# Patient Record
Sex: Female | Born: 1960
Health system: Southern US, Community
[De-identification: ages and names within clinical notes are randomized; demographics above are authoritative.]

## PROBLEM LIST (undated history)

## (undated) DIAGNOSIS — IMO0001 Reserved for inherently not codable concepts without codable children: Secondary | ICD-10-CM

## (undated) DIAGNOSIS — F319 Bipolar disorder, unspecified: Secondary | ICD-10-CM

## (undated) DIAGNOSIS — H9190 Unspecified hearing loss, unspecified ear: Secondary | ICD-10-CM

## (undated) DIAGNOSIS — F329 Major depressive disorder, single episode, unspecified: Secondary | ICD-10-CM

## (undated) DIAGNOSIS — G4730 Sleep apnea, unspecified: Secondary | ICD-10-CM

## (undated) DIAGNOSIS — F32A Depression, unspecified: Secondary | ICD-10-CM

## (undated) HISTORY — DX: Unspecified hearing loss, unspecified ear: H91.90

## (undated) HISTORY — PX: TUBAL LIGATION: SHX77

## (undated) HISTORY — PX: APPENDECTOMY: SHX54

## (undated) HISTORY — DX: Reserved for inherently not codable concepts without codable children: IMO0001

---

## 2001-04-11 ENCOUNTER — Other Ambulatory Visit: Admission: RE | Admit: 2001-04-11 | Discharge: 2001-04-11 | Payer: Self-pay | Admitting: *Deleted

## 2003-12-07 ENCOUNTER — Other Ambulatory Visit: Admission: RE | Admit: 2003-12-07 | Discharge: 2003-12-07 | Payer: Self-pay | Admitting: Family Medicine

## 2004-11-08 ENCOUNTER — Ambulatory Visit (HOSPITAL_COMMUNITY): Admission: RE | Admit: 2004-11-08 | Discharge: 2004-11-08 | Payer: Self-pay | Admitting: Family Medicine

## 2007-08-21 ENCOUNTER — Other Ambulatory Visit: Admission: RE | Admit: 2007-08-21 | Discharge: 2007-08-21 | Payer: Self-pay | Admitting: Family Medicine

## 2012-01-25 ENCOUNTER — Emergency Department (HOSPITAL_BASED_OUTPATIENT_CLINIC_OR_DEPARTMENT_OTHER)
Admission: EM | Admit: 2012-01-25 | Discharge: 2012-01-25 | Disposition: A | Discharge status: Home / Self Care | Payer: BC Managed Care – PPO | Attending: Emergency Medicine | Admitting: Emergency Medicine

## 2012-01-25 ENCOUNTER — Encounter (HOSPITAL_BASED_OUTPATIENT_CLINIC_OR_DEPARTMENT_OTHER): Payer: Self-pay

## 2012-01-25 DIAGNOSIS — N390 Urinary tract infection, site not specified: Secondary | ICD-10-CM | POA: Insufficient documentation

## 2012-01-25 DIAGNOSIS — R109 Unspecified abdominal pain: Secondary | ICD-10-CM | POA: Insufficient documentation

## 2012-01-25 LAB — URINALYSIS, ROUTINE W REFLEX MICROSCOPIC
Glucose, UA: NEGATIVE mg/dL
Ketones, ur: 15 mg/dL — AB
Nitrite: POSITIVE — AB
Protein, ur: NEGATIVE mg/dL
Specific Gravity, Urine: 1.025 (ref 1.005–1.030)
Urobilinogen, UA: 4 mg/dL — ABNORMAL HIGH (ref 0.0–1.0)
pH: 5 (ref 5.0–8.0)

## 2012-01-25 LAB — URINE MICROSCOPIC-ADD ON

## 2012-01-25 MED ORDER — LIDOCAINE HCL (PF) 1 % IJ SOLN
INTRAMUSCULAR | Status: AC
  Administered 2012-01-25: 5 mL
  Filled 2012-01-25: qty 5

## 2012-01-25 MED ORDER — CEFTRIAXONE SODIUM 1 G IJ SOLR
1.0000 g | Freq: Once | INTRAMUSCULAR | Status: AC
  Administered 2012-01-25: 1 g via INTRAMUSCULAR
  Filled 2012-01-25: qty 10

## 2012-01-25 MED ORDER — PENICILLIN V POTASSIUM 500 MG PO TABS: 500.0000 mg | ORAL_TABLET | Freq: Four times a day (QID) | ORAL | Status: AC

## 2012-01-25 NOTE — ED Provider Notes (Signed)
History     CSN: [id number]  Arrival date & time 01/25/12  1150   First MD Initiated Contact with Patient 01/25/12 1206      Chief Complaint  Patient presents with  . Urinary Tract Infection    (Consider location/radiation/quality/duration/timing/severity/associated sxs/prior treatment) HPI Comments: Pt states that she was seen by her doctor 3 days ago and given a script but it doesn't seem to be working:pt states that she has intermittent sharp pain in the suprapubic region:pt is currently taking macrobid, but had been on cipro for 3 days prior  Patient is a 51 y.o. female presenting with urinary tract infection. The history is provided by the patient. No language interpreter was used.  Urinary Tract Infection This is a new problem. The current episode started in the past 7 days. The problem occurs constantly. The problem has been unchanged. Associated symptoms include abdominal pain, chills and coughing. Pertinent negatives include no fever, nausea or vomiting. The symptoms are aggravated by nothing.    History reviewed. No pertinent past medical history.  Past Surgical History  Procedure Date  . Appendectomy     History reviewed. No pertinent family history.  History  Substance Use Topics  . Smoking status: Never Smoker   . Smokeless tobacco: Not on file  . Alcohol Use: Yes     occasionally    OB History    Grav Para Term Preterm Abortions TAB SAB Ect Mult Living                  Review of Systems  Constitutional: Positive for chills. Negative for fever.  HENT: Negative.   Respiratory: Positive for cough.   Cardiovascular: Negative.   Gastrointestinal: Positive for abdominal pain. Negative for nausea and vomiting.  Genitourinary: Negative for vaginal discharge.  Neurological: Negative.     Allergies  Review of patient's allergies indicates no known allergies.  Home Medications  No current outpatient prescriptions on file.  BP 149/91  Pulse 63   Temp(Src) 98.4 F (36.9 C) (Oral)  Resp 16  SpO2 100%  Physical Exam  Nursing note and vitals reviewed. Constitutional: She is oriented to person, place, and time. She appears well-developed and well-nourished.  Eyes: Conjunctivae and EOM are normal.  Neck: Neck supple.  Cardiovascular: Normal rate and regular rhythm.   Pulmonary/Chest: Effort normal and breath sounds normal.  Abdominal: Soft. Bowel sounds are normal. There is no CVA tenderness.  Musculoskeletal: Normal range of motion.  Neurological: She is alert and oriented to person, place, and time.  Skin: Skin is warm and dry.  Psychiatric: She has a normal mood and affect.    ED Course  Procedures (including critical care time)  Labs Reviewed  URINALYSIS, ROUTINE W REFLEX MICROSCOPIC - Abnormal; Notable for the following:    Color, Urine ORANGE (*) BIOCHEMICALS MAY BE AFFECTED BY COLOR   Hgb urine dipstick TRACE (*)    Bilirubin Urine SMALL (*)    Ketones, ur 15 (*)    Urobilinogen, UA 4.0 (*)    Nitrite POSITIVE (*)    Leukocytes, UA MODERATE (*)    All other components within normal limits  URINE MICROSCOPIC-ADD ON - Abnormal; Notable for the following:    Bacteria, UA FEW (*)    All other components within normal limits  URINE CULTURE   No results found.   1. UTI (lower urinary tract infection)       MDM  Husband came to room after initial assessment and offered that she  was on cipro but it wasn't working so they switched to macrobid and that a culture report confirmed sensitivity:husband states that she is having chills and flank pain although pt denies:husband states that unsure of fever because of hydrocodone and tylenol OZH:YQMVHQIO culture report from [facility]:pts NGE:XBMW switch to pcn as it is sensitive per culture report:pt exam is benign        Teressa Lower, NP 01/25/12 1344

## 2012-01-25 NOTE — ED Provider Notes (Signed)
Medical screening examination/treatment/procedure(s) were performed by non-physician practitioner and as supervising physician I was immediately available for consultation/collaboration.   Aramis Zobel, MD 01/25/12 1805 

## 2012-01-25 NOTE — ED Notes (Signed)
Patient states that her symptoms started 3/23. Patient symptoms burning, frequency, taken Azo with no relief. Patient also states that she has pain on left lower side which starts out as sharp and fades away.

## 2012-01-25 NOTE — Discharge Instructions (Signed)
Urinary Tract Infection in Pregnancy A urinary tract infection (UTI) is a bacterial infection of the urinary tract. Infection of the urinary tract can include the ureters, kidneys (pyelonephritis), bladder (cystitis), and urethra (urethritis). All pregnant women should be screened for bacteria in the urinary tract. Identifying and treating a UTI will decrease the risk of preterm labor and developing more serious infections in both the mother and baby. CAUSES Bacteria germs cause almost all UTIs. There are many factors that can increase your chances of getting a UTI during pregnancy. These include:  Having a short urethra.   Poor toilet and hygiene habits.   Sexual intercourse.   Blockage of urine along the urinary tract.   Problems with the pelvic muscles or nerves.   Diabetes.   Obesity.   Bladder problems after having several children.   Previous history of UTI.  SYMPTOMS   Pain, burning, or a stinging feeling when urinating.   Suddenly feeling the need to urinate right away (urgency).   Loss of bladder control (urinary incontinence).   Frequent urination, more than is common with pregnancy.   Lower abdominal or back discomfort.   Bad smelling urine.   Cloudy urine.   Blood in the urine (hematuria).   Fever.  When the kidneys are infected, the symptoms may be:  Back pain.   Flank pain on the right side more so than the left.   Fever.   Chills.   Nausea.   Vomiting.  DIAGNOSIS   Urine tests.   Additional tests and procedures may include:   Ultrasound of the kidneys, ureters, bladder, and urethra.   Looking in the bladder with a lighted tube (cystoscopy).   Certain X-ray studies only when absolutely necessary.  Finding out the results of your test Ask when your test results will be ready. Make sure you get your test results. TREATMENT  Antibiotic medicine by mouth.   Antibiotics given through the vein (intravenously), if needed.  HOME CARE  INSTRUCTIONS   Take your antibiotics as directed. Finish them even if you start to feel better. Only take medicine as directed by your caregiver.   Drink enough fluids to keep your urine clear or pale yellow.   Do not have sexual intercourse until the infection is gone and your caregiver says it is okay.   Make sure you are tested for UTIs throughout your pregnancy if you get one. These infections often come back.  Preventing a UTI in the future:  Practice good toilet habits. Always wipe from front to back. Use the tissue only once.   Do not hold your urine. Empty your bladder as soon as possible when the urge comes.   Do not douche or use deodorant sprays.   Wash with soap and warm water around the genital area and the anus.   Empty your bladder before and after sexual intercourse.   Wear underwear with a cotton crotch.   Avoid caffeine and carbonated drinks. They can irritate the bladder.   Drink cranberry juice or take cranberry pills. This may decrease the risk of getting a UTI.   Do not drink alcohol.   Keep all your appointments and tests as scheduled.  SEEK MEDICAL CARE IF:   Your symptoms get worse.   You are still having fevers 2 or more days after treatment begins.   You develop a rash.   You feel that you are having problems with medicines prescribed.   You develop abnormal vaginal discharge.  SEEK IMMEDIATE MEDICAL   CARE IF:   You develop back or flank pain.   You develop chills.   You have blood in your urine.   You develop nausea and vomiting.   You develop contractions of your uterus.   You have a gush of fluid from the vagina.  MAKE SURE YOU:   Understand these instructions.   Will watch your condition.   Will get help right away if you are not doing well or get worse.  Document Released: 02/10/2011 Document Revised: 10/05/2011 Document Reviewed: 02/10/2011 [employer] Patient Information 2012 [employer]. 

## 2012-01-26 LAB — URINE CULTURE
Colony Count: NO GROWTH
Culture  Setup Time: 201303290208
Culture: NO GROWTH

## 2012-02-06 ENCOUNTER — Other Ambulatory Visit: Payer: Self-pay | Admitting: Family Medicine

## 2014-08-25 ENCOUNTER — Encounter: Payer: Self-pay | Admitting: Pulmonary Disease

## 2014-08-25 ENCOUNTER — Ambulatory Visit (INDEPENDENT_AMBULATORY_CARE_PROVIDER_SITE_OTHER): Payer: 59 | Admitting: Pulmonary Disease

## 2014-08-25 VITALS — BP 120/80 | HR 79 | Temp 98.1°F | Ht 61.0 in | Wt 158.6 lb

## 2014-08-25 DIAGNOSIS — G4733 Obstructive sleep apnea (adult) (pediatric): Secondary | ICD-10-CM

## 2014-08-25 NOTE — Patient Instructions (Signed)
Will refer you to dental medicine for consideration of an oral appliance.  Let me know if this doesn't work out, and we can consider a trial of cpap. Work on weight reduction

## 2014-08-25 NOTE — Progress Notes (Signed)
Subjective:    Patient ID: Kimberly Haynes, female    DOB: 1961-09-05, 53 y.o.   MRN: [id number]  HPI The patient is a 53 year old female who I've been asked to see for management of obstructive sleep apnea. She has had a home sleep test which showed an AHI of 19 events per hour, consistent with moderate OSA.  The patient has been noted to have snoring and an abnormal breathing pattern during sleep by her husband, but she has not had gasping arousals.  She does not have frequent awakenings at night, and feels that she is fairly rested when she gets up and starts her day. She denies any significant sleep pressure during the day, and only has occasional sleepiness in the evenings watching television or movies. She has no sleepiness with driving. Her weight has increased about 8 pounds over the last 2 years, and her Epworth score is only 2.   Sleep Questionnaire What time do you typically go to bed?( Between what hours) 10-11pm 10-11pm at 1430 on 08/25/14 by Lilli Few, CMA How long does it take you to fall asleep? 15 minutes 15 minutes at 1430 on 08/25/14 by Lilli Few, CMA How many times during the night do you wake up? 1 1 at 1430 on 08/25/14 by Lilli Few, CMA What time do you get out of bed to start your day? 0700 0700 at 1430 on 08/25/14 by Lilli Few, CMA Do you drive or operate heavy machinery in your occupation? No No at 1430 on 08/25/14 by Lilli Few, CMA How much has your weight changed (up or down) over the past two years? (In pounds) 6 lb (2.722 kg) 6 lb (2.722 kg) at 1430 on 08/25/14 by Lilli Few, CMA Have you ever had a sleep study before? No No at 1430 on 08/25/14 by Lilli Few, CMA Do you currently use CPAP? No No at 1430 on 08/25/14 by Lilli Few, CMA Do you wear oxygen at any time? No No at 1430 on 08/25/14 by Lilli Few, CMA   Review of Systems  Constitutional: Negative for  fever and unexpected weight change.  HENT: Negative for congestion, dental problem, ear pain, nosebleeds, postnasal drip, rhinorrhea, sinus pressure, sneezing, sore throat and trouble swallowing.   Eyes: Negative for redness and itching.  Respiratory: Negative for cough, chest tightness, shortness of breath and wheezing.   Cardiovascular: Negative for palpitations and leg swelling.  Gastrointestinal: Negative for nausea and vomiting.  Genitourinary: Negative for dysuria.  Musculoskeletal: Negative for joint swelling.  Skin: Negative for rash.  Neurological: Negative for headaches.  Hematological: Does not bruise/bleed easily.  Psychiatric/Behavioral: Negative for dysphoric mood. The patient is not nervous/anxious.        Objective:   Physical Exam Constitutional:  Well developed, no acute distress  HENT:  Nares patent without discharge, but narrowed bilat.  Oropharynx without exudate, palate and uvula are mildly elongated.  Eyes:  Perrla, eomi, no scleral icterus  Neck:  No JVD, no TMG  Cardiovascular:  Normal rate, regular rhythm, no rubs or gallops.  No murmurs        Intact distal pulses  Pulmonary :  Normal breath sounds, no stridor or respiratory distress   No rales, rhonchi, or wheezing  Abdominal:  Soft, nondistended, bowel sounds present.  No tenderness noted.   Musculoskeletal:  No lower extremity edema noted.  Lymph Nodes:  No cervical lymphadenopathy noted  Skin:  No cyanosis noted  Neurologic:  Alert, appropriate, moves all 4 extremities without obvious deficit.         Assessment & Plan:

## 2014-08-25 NOTE — Assessment & Plan Note (Signed)
The patient has moderate obstructive sleep apnea by her recent home sleep test, but surprisingly has no issues with sleep or daytime alertness issues. She is concerned about its impact to her husband's sleep, and would like to treat this aggressively because of this. Given the severity of her sleep apnea, there is some increased risk for cardiovascular events, but it is not overly excessive. I have outlined a conservative approach for treatment with a trial of weight loss alone, but the patient would prefer a more aggressive approach. I do think she is a reasonable candidate for dental appliance as well as C Pap, but I do not think upper airway surgery is a good treatment option for her. The patient does not think she can wear C Pap, and would prefer trying a dental appliance first.

## 2014-09-08 ENCOUNTER — Institutional Professional Consult (permissible substitution): Payer: BC Managed Care – PPO | Admitting: Internal Medicine

## 2015-09-22 ENCOUNTER — Other Ambulatory Visit (HOSPITAL_COMMUNITY)
Admission: RE | Admit: 2015-09-22 | Discharge: 2015-09-22 | Disposition: A | Discharge status: Home / Self Care | Payer: 59 | Source: Ambulatory Visit | Attending: Family Medicine | Admitting: Family Medicine

## 2015-09-22 ENCOUNTER — Other Ambulatory Visit: Payer: Self-pay | Admitting: Family Medicine

## 2015-09-22 DIAGNOSIS — Z01419 Encounter for gynecological examination (general) (routine) without abnormal findings: Secondary | ICD-10-CM | POA: Insufficient documentation

## 2015-09-28 LAB — CYTOLOGY - PAP

## 2015-11-04 MED FILL — ZALEPLON 10 MG CAPSULE: 10 | 30 days supply | Qty: 60 | Fill #0

## 2015-11-05 MED FILL — BUPROPION HCL XL 300 MG TAB: 300 | 30 days supply | Qty: 30 | Fill #2

## 2015-11-05 MED FILL — VYVANSE 40 MG CAPSULE: 40 | 30 days supply | Qty: 30 | Fill #0

## 2015-11-05 MED FILL — ZIPRASIDONE HCL 20 MG CAP: 20 | 30 days supply | Qty: 30 | Fill #2

## 2015-11-23 MED FILL — ZIPRASIDONE HCL 80 MG CAP: 80 | 30 days supply | Qty: 30 | Fill #1

## 2015-12-03 MED FILL — VYVANSE 40 MG CAPSULE: 40 | 90 days supply | Qty: 90 | Fill #0

## 2015-12-06 MED FILL — ZIPRASIDONE HCL 20 MG CAP: 20 | 30 days supply | Qty: 30 | Fill #3

## 2015-12-06 MED FILL — BUPROPION HCL XL 300 MG TAB: 300 | 30 days supply | Qty: 30 | Fill #3

## 2015-12-14 MED FILL — ZALEPLON 10 MG CAPSULE: 10 | 30 days supply | Qty: 60 | Fill #1

## 2015-12-20 MED FILL — ZIPRASIDONE HCL 20 MG CAP: 20 | 30 days supply | Qty: 60 | Fill #0

## 2015-12-24 MED FILL — ZIPRASIDONE HCL 80 MG CAP: 80 | 30 days supply | Qty: 30 | Fill #2

## 2016-01-21 MED FILL — ZIPRASIDONE HCL 60 MG CAP: 60 | 30 days supply | Qty: 30 | Fill #2

## 2016-01-21 MED FILL — ZIPRASIDONE HCL 80 MG CAP: 80 | 30 days supply | Qty: 30 | Fill #3

## 2016-01-21 MED FILL — BUPROPION HCL XL 300 MG TAB: 300 | 30 days supply | Qty: 30 | Fill #4

## 2016-01-21 MED FILL — ZALEPLON 10 MG CAPSULE: 10 | 30 days supply | Qty: 60 | Fill #2

## 2016-01-24 MED FILL — ZIPRASIDONE HCL 20 MG CAP: 20 | 30 days supply | Qty: 60 | Fill #1

## 2016-02-16 MED FILL — BUPROPION HCL XL 300 MG TAB: 300 | 30 days supply | Qty: 30 | Fill #5

## 2016-02-16 MED FILL — lamoTRIgine 100 MG TABS: 100 | 30 days supply | Qty: 30 | Fill #1

## 2016-02-18 MED FILL — ZALEPLON 10 MG CAPSULE: 10 | 30 days supply | Qty: 60 | Fill #3

## 2016-02-18 MED FILL — ZIPRASIDONE HCL 20 MG CAP: 20 | 30 days supply | Qty: 60 | Fill #2

## 2016-02-18 MED FILL — ZIPRASIDONE HCL 80 MG CAP: 80 | 30 days supply | Qty: 30 | Fill #4

## 2016-02-28 DIAGNOSIS — F3181 Bipolar II disorder: Secondary | ICD-10-CM | POA: Diagnosis not present

## 2016-02-28 DIAGNOSIS — Z1239 Encounter for other screening for malignant neoplasm of breast: Secondary | ICD-10-CM | POA: Diagnosis not present

## 2016-02-28 DIAGNOSIS — F900 Attention-deficit hyperactivity disorder, predominantly inattentive type: Secondary | ICD-10-CM | POA: Diagnosis not present

## 2016-02-28 DIAGNOSIS — F419 Anxiety disorder, unspecified: Secondary | ICD-10-CM | POA: Diagnosis not present

## 2016-02-28 DIAGNOSIS — G4700 Insomnia, unspecified: Secondary | ICD-10-CM | POA: Diagnosis not present

## 2016-03-06 MED FILL — VYVANSE 40 MG CAPSULE: 40 | 90 days supply | Qty: 90 | Fill #0

## 2016-03-21 MED FILL — ZALEPLON 10 MG CAPSULE: 10 | 90 days supply | Qty: 180 | Fill #0

## 2016-03-21 MED FILL — BUPROPION HCL XL 300 MG TAB: 300 | 90 days supply | Qty: 90 | Fill #0

## 2016-03-21 MED FILL — ZIPRASIDONE HCL 20 MG CAP: 20 | 60 days supply | Qty: 180 | Fill #0

## 2016-03-21 MED FILL — ZIPRASIDONE HCL 80 MG CAP: 80 | 90 days supply | Qty: 90 | Fill #0

## 2016-03-22 MED FILL — lamoTRIgine 100 MG TABS: 100 | 90 days supply | Qty: 90 | Fill #0

## 2016-03-22 MED FILL — traZODone HCL 100 MG TABS: 100 | 30 days supply | Qty: 30 | Fill #0

## 2016-05-26 MED FILL — VYVANSE 40 MG CAPSULE: 40 | 90 days supply | Qty: 90 | Fill #0

## 2016-06-13 MED FILL — ZIPRASIDONE HCL 80 MG CAP: 80 | 90 days supply | Qty: 90 | Fill #1

## 2016-06-13 MED FILL — BUPROPION HCL XL 300 MG TAB: 300 | 90 days supply | Qty: 90 | Fill #1

## 2016-06-22 MED FILL — ZIPRASIDONE HCL 20 MG CAP: 20 | 60 days supply | Qty: 180 | Fill #1

## 2016-06-22 MED FILL — ZALEPLON 10 MG CAPSULE: 10 | 90 days supply | Qty: 180 | Fill #1

## 2016-08-30 DIAGNOSIS — Z1211 Encounter for screening for malignant neoplasm of colon: Secondary | ICD-10-CM | POA: Diagnosis not present

## 2016-08-30 DIAGNOSIS — Z23 Encounter for immunization: Secondary | ICD-10-CM | POA: Diagnosis not present

## 2016-08-30 DIAGNOSIS — Z1239 Encounter for other screening for malignant neoplasm of breast: Secondary | ICD-10-CM | POA: Diagnosis not present

## 2016-08-30 DIAGNOSIS — F3181 Bipolar II disorder: Secondary | ICD-10-CM | POA: Diagnosis not present

## 2016-08-30 DIAGNOSIS — F419 Anxiety disorder, unspecified: Secondary | ICD-10-CM | POA: Diagnosis not present

## 2016-08-30 DIAGNOSIS — G4700 Insomnia, unspecified: Secondary | ICD-10-CM | POA: Diagnosis not present

## 2016-08-30 DIAGNOSIS — F900 Attention-deficit hyperactivity disorder, predominantly inattentive type: Secondary | ICD-10-CM | POA: Diagnosis not present

## 2016-08-30 MED FILL — traZODone HCL 100 MG TABS: 100 | 30 days supply | Qty: 30 | Fill #1

## 2016-08-30 MED FILL — lamoTRIgine 100 MG TABS: 100 | 90 days supply | Qty: 90 | Fill #1

## 2016-08-31 MED FILL — VYVANSE 40 MG CAPSULE: 40 | 90 days supply | Qty: 90 | Fill #0

## 2016-09-04 ENCOUNTER — Other Ambulatory Visit: Payer: Self-pay | Admitting: Family Medicine

## 2016-09-04 DIAGNOSIS — Z1231 Encounter for screening mammogram for malignant neoplasm of breast: Secondary | ICD-10-CM

## 2016-09-14 MED FILL — BUPROPION HCL XL 300 MG TAB: 300 | 90 days supply | Qty: 90 | Fill #0

## 2016-09-15 ENCOUNTER — Ambulatory Visit
Admission: RE | Admit: 2016-09-15 | Discharge: 2016-09-15 | Disposition: A | Discharge status: Home / Self Care | Payer: Self-pay | Source: Ambulatory Visit | Attending: Family Medicine | Admitting: Family Medicine

## 2016-09-15 DIAGNOSIS — Z1231 Encounter for screening mammogram for malignant neoplasm of breast: Secondary | ICD-10-CM

## 2016-09-18 MED FILL — ZIPRASIDONE HCL 80 MG CAP: 80 | 90 days supply | Qty: 90 | Fill #0

## 2016-09-18 MED FILL — ZIPRASIDONE HCL 20 MG CAP: 20 | 90 days supply | Qty: 180 | Fill #0

## 2016-09-25 ENCOUNTER — Other Ambulatory Visit: Payer: Self-pay | Admitting: Family Medicine

## 2016-09-25 DIAGNOSIS — R928 Other abnormal and inconclusive findings on diagnostic imaging of breast: Secondary | ICD-10-CM

## 2016-09-25 MED FILL — ZALEPLON 10 MG CAPSULE: 10 | 90 days supply | Qty: 180 | Fill #0

## 2016-09-27 DIAGNOSIS — Z0000 Encounter for general adult medical examination without abnormal findings: Secondary | ICD-10-CM | POA: Diagnosis not present

## 2016-09-27 DIAGNOSIS — G4700 Insomnia, unspecified: Secondary | ICD-10-CM | POA: Diagnosis not present

## 2016-09-27 DIAGNOSIS — F3181 Bipolar II disorder: Secondary | ICD-10-CM | POA: Diagnosis not present

## 2016-09-27 DIAGNOSIS — Z13220 Encounter for screening for lipoid disorders: Secondary | ICD-10-CM | POA: Diagnosis not present

## 2016-09-27 DIAGNOSIS — H9190 Unspecified hearing loss, unspecified ear: Secondary | ICD-10-CM | POA: Diagnosis not present

## 2016-09-28 DIAGNOSIS — H52223 Regular astigmatism, bilateral: Secondary | ICD-10-CM | POA: Diagnosis not present

## 2016-09-28 DIAGNOSIS — H5213 Myopia, bilateral: Secondary | ICD-10-CM | POA: Diagnosis not present

## 2016-10-03 ENCOUNTER — Ambulatory Visit
Admission: RE | Admit: 2016-10-03 | Discharge: 2016-10-03 | Disposition: A | Discharge status: Home / Self Care | Payer: 59 | Source: Ambulatory Visit | Attending: Family Medicine | Admitting: Family Medicine

## 2016-10-03 ENCOUNTER — Other Ambulatory Visit: Payer: Self-pay | Admitting: Family Medicine

## 2016-10-03 DIAGNOSIS — R928 Other abnormal and inconclusive findings on diagnostic imaging of breast: Secondary | ICD-10-CM

## 2016-10-03 DIAGNOSIS — N6311 Unspecified lump in the right breast, upper outer quadrant: Secondary | ICD-10-CM | POA: Diagnosis not present

## 2016-10-03 DIAGNOSIS — H903 Sensorineural hearing loss, bilateral: Secondary | ICD-10-CM | POA: Diagnosis not present

## 2016-10-03 DIAGNOSIS — N6322 Unspecified lump in the left breast, upper inner quadrant: Secondary | ICD-10-CM | POA: Diagnosis not present

## 2016-10-03 DIAGNOSIS — N6310 Unspecified lump in the right breast, unspecified quadrant: Secondary | ICD-10-CM

## 2016-10-10 ENCOUNTER — Other Ambulatory Visit: Payer: Self-pay | Admitting: Family Medicine

## 2016-10-10 DIAGNOSIS — N6310 Unspecified lump in the right breast, unspecified quadrant: Secondary | ICD-10-CM

## 2016-10-11 ENCOUNTER — Ambulatory Visit
Admission: RE | Admit: 2016-10-11 | Discharge: 2016-10-11 | Disposition: A | Discharge status: Home / Self Care | Payer: 59 | Source: Ambulatory Visit | Attending: Family Medicine | Admitting: Family Medicine

## 2016-10-11 DIAGNOSIS — D241 Benign neoplasm of right breast: Secondary | ICD-10-CM | POA: Diagnosis not present

## 2016-10-11 DIAGNOSIS — N6310 Unspecified lump in the right breast, unspecified quadrant: Secondary | ICD-10-CM

## 2016-10-11 DIAGNOSIS — N6311 Unspecified lump in the right breast, upper outer quadrant: Secondary | ICD-10-CM | POA: Diagnosis not present

## 2016-10-11 DIAGNOSIS — N6011 Diffuse cystic mastopathy of right breast: Secondary | ICD-10-CM | POA: Diagnosis not present

## 2016-10-11 HISTORY — PX: BREAST BIOPSY: SHX20

## 2016-11-01 ENCOUNTER — Other Ambulatory Visit: Payer: Self-pay | Admitting: General Surgery

## 2016-11-01 ENCOUNTER — Encounter (HOSPITAL_BASED_OUTPATIENT_CLINIC_OR_DEPARTMENT_OTHER): Payer: Self-pay | Admitting: *Deleted

## 2016-11-01 DIAGNOSIS — N6310 Unspecified lump in the right breast, unspecified quadrant: Secondary | ICD-10-CM

## 2016-11-03 ENCOUNTER — Ambulatory Visit
Admission: RE | Admit: 2016-11-03 | Discharge: 2016-11-03 | Disposition: A | Discharge status: Home / Self Care | Payer: 59 | Source: Ambulatory Visit | Attending: General Surgery | Admitting: General Surgery

## 2016-11-03 DIAGNOSIS — N6310 Unspecified lump in the right breast, unspecified quadrant: Secondary | ICD-10-CM

## 2016-11-03 DIAGNOSIS — D241 Benign neoplasm of right breast: Secondary | ICD-10-CM | POA: Diagnosis not present

## 2016-11-06 DIAGNOSIS — H903 Sensorineural hearing loss, bilateral: Secondary | ICD-10-CM | POA: Diagnosis not present

## 2016-11-06 NOTE — Anesthesia Preprocedure Evaluation (Addendum)
Anesthesia Evaluation  Patient identified by MRN, date of birth, ID band Patient awake    Reviewed: Allergy & Precautions, NPO status , Patient's Chart, lab work & pertinent test results  Airway Mallampati: II  TM Distance: >3 FB Neck ROM: Full    Dental  (+) Teeth Intact, Dental Advisory Given   Pulmonary sleep apnea ,    Pulmonary exam normal breath sounds clear to auscultation       Cardiovascular negative cardio ROS Normal cardiovascular exam Rhythm:Regular Rate:Normal     Neuro/Psych  Headaches, PSYCHIATRIC DISORDERS Depression    GI/Hepatic negative GI ROS, Neg liver ROS,   Endo/Other  Obesity   Renal/GU negative Renal ROS     Musculoskeletal negative musculoskeletal ROS (+)   Abdominal   Peds  Hematology negative hematology ROS (+)   Anesthesia Other Findings Day of surgery medications reviewed with the patient.  Reproductive/Obstetrics                            Anesthesia Physical Anesthesia Plan  ASA: II  Anesthesia Plan: General   Post-op Pain Management:    Induction: Intravenous  Airway Management Planned: LMA  Additional Equipment:   Intra-op Plan:   Post-operative Plan: Extubation in OR  Informed Consent: I have reviewed the patients History and Physical, chart, labs and discussed the procedure including the risks, benefits and alternatives for the proposed anesthesia with the patient or authorized representative who has indicated his/her understanding and acceptance.   Dental advisory given  Plan Discussed with: CRNA  Anesthesia Plan Comments: (Risks/benefits of general anesthesia discussed with patient including risk of damage to teeth, lips, gum, and tongue, nausea/vomiting, allergic reactions to medications, and the possibility of heart attack, stroke and death.  All patient questions answered.  Patient wishes to proceed.)       Anesthesia Quick  Evaluation

## 2016-11-06 NOTE — Progress Notes (Signed)
Pt instructed to drink Boost by 0530 day of surgery with teach back method.

## 2016-11-07 ENCOUNTER — Ambulatory Visit (HOSPITAL_BASED_OUTPATIENT_CLINIC_OR_DEPARTMENT_OTHER): Payer: 59 | Admitting: Anesthesiology

## 2016-11-07 ENCOUNTER — Ambulatory Visit (HOSPITAL_BASED_OUTPATIENT_CLINIC_OR_DEPARTMENT_OTHER)
Admission: RE | Admit: 2016-11-07 | Discharge: 2016-11-07 | Disposition: A | Discharge status: Home / Self Care | Payer: 59 | Source: Ambulatory Visit | Attending: General Surgery | Admitting: General Surgery

## 2016-11-07 ENCOUNTER — Encounter (HOSPITAL_BASED_OUTPATIENT_CLINIC_OR_DEPARTMENT_OTHER): Payer: Self-pay | Admitting: Anesthesiology

## 2016-11-07 ENCOUNTER — Ambulatory Visit
Admission: RE | Admit: 2016-11-07 | Discharge: 2016-11-07 | Disposition: A | Discharge status: Home / Self Care | Payer: 59 | Source: Ambulatory Visit | Attending: General Surgery | Admitting: General Surgery

## 2016-11-07 ENCOUNTER — Other Ambulatory Visit (HOSPITAL_COMMUNITY): Payer: Self-pay | Admitting: Otolaryngology

## 2016-11-07 ENCOUNTER — Encounter (HOSPITAL_BASED_OUTPATIENT_CLINIC_OR_DEPARTMENT_OTHER)
Admission: RE | Disposition: A | Discharge status: Home / Self Care | Payer: Self-pay | Source: Ambulatory Visit | Attending: General Surgery

## 2016-11-07 DIAGNOSIS — N6081 Other benign mammary dysplasias of right breast: Secondary | ICD-10-CM | POA: Diagnosis not present

## 2016-11-07 DIAGNOSIS — N6021 Fibroadenosis of right breast: Secondary | ICD-10-CM | POA: Diagnosis not present

## 2016-11-07 DIAGNOSIS — Z6830 Body mass index (BMI) 30.0-30.9, adult: Secondary | ICD-10-CM | POA: Diagnosis not present

## 2016-11-07 DIAGNOSIS — G4730 Sleep apnea, unspecified: Secondary | ICD-10-CM | POA: Diagnosis not present

## 2016-11-07 DIAGNOSIS — D241 Benign neoplasm of right breast: Secondary | ICD-10-CM | POA: Insufficient documentation

## 2016-11-07 DIAGNOSIS — N63 Unspecified lump in unspecified breast: Secondary | ICD-10-CM | POA: Diagnosis not present

## 2016-11-07 DIAGNOSIS — E669 Obesity, unspecified: Secondary | ICD-10-CM | POA: Diagnosis not present

## 2016-11-07 DIAGNOSIS — N6310 Unspecified lump in the right breast, unspecified quadrant: Secondary | ICD-10-CM

## 2016-11-07 DIAGNOSIS — N6011 Diffuse cystic mastopathy of right breast: Secondary | ICD-10-CM | POA: Diagnosis not present

## 2016-11-07 DIAGNOSIS — N6091 Unspecified benign mammary dysplasia of right breast: Secondary | ICD-10-CM | POA: Diagnosis not present

## 2016-11-07 DIAGNOSIS — F329 Major depressive disorder, single episode, unspecified: Secondary | ICD-10-CM | POA: Insufficient documentation

## 2016-11-07 DIAGNOSIS — Z79899 Other long term (current) drug therapy: Secondary | ICD-10-CM | POA: Diagnosis not present

## 2016-11-07 DIAGNOSIS — G4733 Obstructive sleep apnea (adult) (pediatric): Secondary | ICD-10-CM | POA: Diagnosis not present

## 2016-11-07 DIAGNOSIS — R921 Mammographic calcification found on diagnostic imaging of breast: Secondary | ICD-10-CM | POA: Diagnosis not present

## 2016-11-07 DIAGNOSIS — H903 Sensorineural hearing loss, bilateral: Secondary | ICD-10-CM

## 2016-11-07 HISTORY — PX: RADIOACTIVE SEED GUIDED EXCISIONAL BREAST BIOPSY: SHX6490

## 2016-11-07 HISTORY — DX: Sleep apnea, unspecified: G47.30

## 2016-11-07 SURGERY — RADIOACTIVE SEED GUIDED BREAST BIOPSY
Anesthesia: General | Site: Breast | Laterality: Right

## 2016-11-07 MED ORDER — HYDROCODONE-ACETAMINOPHEN 10-325 MG PO TABS: 1.0000 | ORAL_TABLET | Freq: Four times a day (QID) | ORAL | 0 refills | Status: DC | PRN

## 2016-11-07 MED ORDER — DEXAMETHASONE SODIUM PHOSPHATE 10 MG/ML IJ SOLN
INTRAMUSCULAR | Status: AC
  Filled 2016-11-07: qty 1

## 2016-11-07 MED ORDER — LACTATED RINGERS IV SOLN
INTRAVENOUS | Status: DC
  Administered 2016-11-07 (×2): via INTRAVENOUS

## 2016-11-07 MED ORDER — CELECOXIB 200 MG PO CAPS
ORAL_CAPSULE | ORAL | Status: AC
  Filled 2016-11-07: qty 2

## 2016-11-07 MED ORDER — KETOROLAC TROMETHAMINE 30 MG/ML IJ SOLN
INTRAMUSCULAR | Status: AC
  Filled 2016-11-07: qty 1

## 2016-11-07 MED ORDER — BUPIVACAINE HCL (PF) 0.25 % IJ SOLN
INTRAMUSCULAR | Status: AC
Start: 2016-11-07 — End: 2016-11-07
  Filled 2016-11-07: qty 90

## 2016-11-07 MED ORDER — ACETAMINOPHEN 500 MG PO TABS
1000.0000 mg | ORAL_TABLET | ORAL | Status: AC
  Administered 2016-11-07: 1000 mg via ORAL

## 2016-11-07 MED ORDER — LIDOCAINE 2% (20 MG/ML) 5 ML SYRINGE
INTRAMUSCULAR | Status: AC
  Filled 2016-11-07: qty 5

## 2016-11-07 MED ORDER — MIDAZOLAM HCL 2 MG/2ML IJ SOLN
INTRAMUSCULAR | Status: AC
  Filled 2016-11-07: qty 2

## 2016-11-07 MED ORDER — SCOPOLAMINE 1 MG/3DAYS TD PT72: 1.0000 | MEDICATED_PATCH | Freq: Once | TRANSDERMAL | Status: DC | PRN

## 2016-11-07 MED ORDER — ONDANSETRON HCL 4 MG/2ML IJ SOLN
INTRAMUSCULAR | Status: AC
  Filled 2016-11-07: qty 2

## 2016-11-07 MED ORDER — KETOROLAC TROMETHAMINE 30 MG/ML IJ SOLN
INTRAMUSCULAR | Status: DC | PRN
  Administered 2016-11-07: 30 mg via INTRAVENOUS

## 2016-11-07 MED ORDER — CELECOXIB 200 MG PO CAPS: 200.0000 mg | ORAL_CAPSULE | ORAL | Status: DC

## 2016-11-07 MED ORDER — ACETAMINOPHEN 500 MG PO TABS
ORAL_TABLET | ORAL | Status: AC
  Filled 2016-11-07: qty 2

## 2016-11-07 MED ORDER — LIDOCAINE HCL (CARDIAC) 20 MG/ML IV SOLN
INTRAVENOUS | Status: DC | PRN
  Administered 2016-11-07: 30 mg via INTRAVENOUS

## 2016-11-07 MED ORDER — PROMETHAZINE HCL 25 MG/ML IJ SOLN: 6.2500 mg | INTRAMUSCULAR | Status: DC | PRN

## 2016-11-07 MED ORDER — METHYLENE BLUE 0.5 % INJ SOLN
INTRAVENOUS | Status: AC
  Filled 2016-11-07: qty 10

## 2016-11-07 MED ORDER — PROPOFOL 500 MG/50ML IV EMUL
INTRAVENOUS | Status: AC
  Filled 2016-11-07: qty 50

## 2016-11-07 MED ORDER — ONDANSETRON HCL 4 MG/2ML IJ SOLN
INTRAMUSCULAR | Status: DC | PRN
  Administered 2016-11-07: 4 mg via INTRAVENOUS

## 2016-11-07 MED ORDER — BUPIVACAINE HCL (PF) 0.25 % IJ SOLN
INTRAMUSCULAR | Status: DC | PRN
  Administered 2016-11-07: 10 mL

## 2016-11-07 MED ORDER — FENTANYL CITRATE (PF) 100 MCG/2ML IJ SOLN: 25.0000 ug | INTRAMUSCULAR | Status: DC | PRN

## 2016-11-07 MED ORDER — GABAPENTIN 300 MG PO CAPS
300.0000 mg | ORAL_CAPSULE | ORAL | Status: AC
  Administered 2016-11-07: 300 mg via ORAL

## 2016-11-07 MED ORDER — CEFAZOLIN SODIUM-DEXTROSE 2-4 GM/100ML-% IV SOLN
2.0000 g | INTRAVENOUS | Status: AC
  Administered 2016-11-07: 2 g via INTRAVENOUS

## 2016-11-07 MED ORDER — SODIUM CHLORIDE 0.9 % IJ SOLN
INTRAMUSCULAR | Status: AC
  Filled 2016-11-07: qty 10

## 2016-11-07 MED ORDER — MIDAZOLAM HCL 5 MG/5ML IJ SOLN
INTRAMUSCULAR | Status: DC | PRN
  Administered 2016-11-07: 2 mg via INTRAVENOUS

## 2016-11-07 MED ORDER — GABAPENTIN 300 MG PO CAPS
ORAL_CAPSULE | ORAL | Status: AC
  Filled 2016-11-07: qty 1

## 2016-11-07 MED ORDER — PROPOFOL 10 MG/ML IV BOLUS
INTRAVENOUS | Status: DC | PRN
  Administered 2016-11-07: 50 mg via INTRAVENOUS
  Administered 2016-11-07: 150 mg via INTRAVENOUS
  Administered 2016-11-07: 100 mg via INTRAVENOUS

## 2016-11-07 MED ORDER — CEFAZOLIN SODIUM-DEXTROSE 2-4 GM/100ML-% IV SOLN
INTRAVENOUS | Status: AC
  Filled 2016-11-07: qty 100

## 2016-11-07 MED ORDER — FENTANYL CITRATE (PF) 100 MCG/2ML IJ SOLN: 50.0000 ug | INTRAMUSCULAR | Status: DC | PRN

## 2016-11-07 MED ORDER — MIDAZOLAM HCL 2 MG/2ML IJ SOLN: 1.0000 mg | INTRAMUSCULAR | Status: DC | PRN

## 2016-11-07 MED ORDER — FENTANYL CITRATE (PF) 100 MCG/2ML IJ SOLN
INTRAMUSCULAR | Status: AC
  Filled 2016-11-07: qty 2

## 2016-11-07 MED ORDER — DEXAMETHASONE SODIUM PHOSPHATE 4 MG/ML IJ SOLN
INTRAMUSCULAR | Status: DC | PRN
  Administered 2016-11-07: 10 mg via INTRAVENOUS

## 2016-11-07 MED ORDER — FENTANYL CITRATE (PF) 100 MCG/2ML IJ SOLN
INTRAMUSCULAR | Status: DC | PRN
  Administered 2016-11-07: 100 ug via INTRAVENOUS

## 2016-11-07 MED FILL — HYDROCODON-APAP 10-325: 10-325 | 2 days supply | Qty: 10 | Fill #0

## 2016-11-07 SURGICAL SUPPLY — 63 items
ADH SKN CLS APL DERMABOND .7 (GAUZE/BANDAGES/DRESSINGS) ×1
BINDER BREAST LRG (GAUZE/BANDAGES/DRESSINGS) ×2 IMPLANT
BINDER BREAST MEDIUM (GAUZE/BANDAGES/DRESSINGS) IMPLANT
BINDER BREAST XLRG (GAUZE/BANDAGES/DRESSINGS) IMPLANT
BINDER BREAST XXLRG (GAUZE/BANDAGES/DRESSINGS) IMPLANT
BLADE SURG 15 STRL LF DISP TIS (BLADE) ×1 IMPLANT
BLADE SURG 15 STRL SS (BLADE) ×3
CANISTER SUC SOCK COL 7IN (MISCELLANEOUS) IMPLANT
CANISTER SUCT 1200ML W/VALVE (MISCELLANEOUS) IMPLANT
CHLORAPREP W/TINT 26ML (MISCELLANEOUS) ×3 IMPLANT
CLIP TI WIDE RED SMALL 6 (CLIP) ×2 IMPLANT
CLOSURE WOUND 1/2 X4 (GAUZE/BANDAGES/DRESSINGS) ×1
COVER BACK TABLE 60X90IN (DRAPES) ×3 IMPLANT
COVER MAYO STAND STRL (DRAPES) ×3 IMPLANT
COVER PROBE W GEL 5X96 (DRAPES) ×3 IMPLANT
DECANTER SPIKE VIAL GLASS SM (MISCELLANEOUS) IMPLANT
DERMABOND ADVANCED (GAUZE/BANDAGES/DRESSINGS) ×2
DERMABOND ADVANCED .7 DNX12 (GAUZE/BANDAGES/DRESSINGS) ×1 IMPLANT
DEVICE DUBIN W/COMP PLATE 8390 (MISCELLANEOUS) ×3 IMPLANT
DRAPE LAPAROSCOPIC ABDOMINAL (DRAPES) ×3 IMPLANT
DRAPE UTILITY XL STRL (DRAPES) ×3 IMPLANT
DRSG TEGADERM 4X4.75 (GAUZE/BANDAGES/DRESSINGS) IMPLANT
ELECT COATED BLADE 2.86 ST (ELECTRODE) ×3 IMPLANT
ELECT REM PT RETURN 9FT ADLT (ELECTROSURGICAL) ×3
ELECTRODE REM PT RTRN 9FT ADLT (ELECTROSURGICAL) ×1 IMPLANT
GLOVE BIO SURGEON STRL SZ 6.5 (GLOVE) ×1 IMPLANT
GLOVE BIO SURGEON STRL SZ7 (GLOVE) ×6 IMPLANT
GLOVE BIO SURGEONS STRL SZ 6.5 (GLOVE) ×1
GLOVE BIOGEL PI IND STRL 7.0 (GLOVE) IMPLANT
GLOVE BIOGEL PI IND STRL 7.5 (GLOVE) ×1 IMPLANT
GLOVE BIOGEL PI INDICATOR 7.0 (GLOVE) ×6
GLOVE BIOGEL PI INDICATOR 7.5 (GLOVE) ×2
GLOVE SURG SYN 7.5  E (GLOVE) ×2
GLOVE SURG SYN 7.5 E (GLOVE) ×1 IMPLANT
GLOVE SURG SYN 7.5 PF PI (GLOVE) IMPLANT
GOWN STRL REUS W/ TWL LRG LVL3 (GOWN DISPOSABLE) ×2 IMPLANT
GOWN STRL REUS W/TWL LRG LVL3 (GOWN DISPOSABLE) ×9
ILLUMINATOR WAVEGUIDE N/F (MISCELLANEOUS) IMPLANT
KIT MARKER MARGIN INK (KITS) ×3 IMPLANT
LIGHT WAVEGUIDE WIDE FLAT (MISCELLANEOUS) IMPLANT
NDL HYPO 25X1 1.5 SAFETY (NEEDLE) ×1 IMPLANT
NEEDLE HYPO 25X1 1.5 SAFETY (NEEDLE) ×3 IMPLANT
NS IRRIG 1000ML POUR BTL (IV SOLUTION) IMPLANT
PACK BASIN DAY SURGERY FS (CUSTOM PROCEDURE TRAY) ×3 IMPLANT
PENCIL BUTTON HOLSTER BLD 10FT (ELECTRODE) ×3 IMPLANT
SLEEVE SCD COMPRESS KNEE MED (MISCELLANEOUS) ×3 IMPLANT
SPONGE GAUZE 4X4 12PLY STER LF (GAUZE/BANDAGES/DRESSINGS) IMPLANT
SPONGE LAP 4X18 X RAY DECT (DISPOSABLE) ×3 IMPLANT
STRIP CLOSURE SKIN 1/2X4 (GAUZE/BANDAGES/DRESSINGS) ×2 IMPLANT
SUT MNCRL AB 4-0 PS2 18 (SUTURE) IMPLANT
SUT MON AB 5-0 PS2 18 (SUTURE) IMPLANT
SUT SILK 2 0 SH (SUTURE) IMPLANT
SUT VIC AB 2-0 SH 27 (SUTURE) ×3
SUT VIC AB 2-0 SH 27XBRD (SUTURE) ×1 IMPLANT
SUT VIC AB 3-0 SH 27 (SUTURE) ×3
SUT VIC AB 3-0 SH 27X BRD (SUTURE) ×1 IMPLANT
SUT VIC AB 5-0 PS2 18 (SUTURE) IMPLANT
SYR CONTROL 10ML LL (SYRINGE) ×3 IMPLANT
TOWEL OR 17X24 6PK STRL BLUE (TOWEL DISPOSABLE) ×3 IMPLANT
TOWEL OR NON WOVEN STRL DISP B (DISPOSABLE) ×1 IMPLANT
TUBE CONNECTING 20'X1/4 (TUBING)
TUBE CONNECTING 20X1/4 (TUBING) IMPLANT
YANKAUER SUCT BULB TIP NO VENT (SUCTIONS) IMPLANT

## 2016-11-07 NOTE — Anesthesia Postprocedure Evaluation (Signed)
Anesthesia Post Note  Patient: Kimberly Haynes  Procedure(s) Performed: Procedure(s) (LRB): RADIOACTIVE SEED GUIDED EXCISIONAL BREAST BIOPSY (Right)  Patient location during evaluation: PACU Anesthesia Type: General Level of consciousness: sedated and patient cooperative Pain management: pain level controlled Vital Signs Assessment: post-procedure vital signs reviewed and stable Respiratory status: spontaneous breathing Cardiovascular status: stable Anesthetic complications: no       Last Vitals:  Vitals:   11/07/16 1030 11/07/16 1057  BP:  122/78  Pulse: 75 75  Resp: 19 18  Temp:  36.6 C    Last Pain:  Vitals:   11/07/16 1057  TempSrc: Oral  PainSc: 0-No pain                 Nolon Nations

## 2016-11-07 NOTE — Transfer of Care (Signed)
Immediate Anesthesia Transfer of Care Note  Patient: Kimberly Haynes  Procedure(s) Performed: Procedure(s): RADIOACTIVE SEED GUIDED EXCISIONAL BREAST BIOPSY (Right)  Patient Location: PACU  Anesthesia Type:General  Level of Consciousness: awake and patient cooperative  Airway & Oxygen Therapy: Patient Spontanous Breathing and Patient connected to face mask oxygen  Post-op Assessment: Report given to RN and Post -op Vital signs reviewed and stable  Post vital signs: Reviewed and stable  Last Vitals:  Vitals:   11/07/16 0747  BP: (!) 149/84  Pulse: 98  Resp: 20  Temp: 36.9 C    Last Pain:  Vitals:   11/07/16 0747  TempSrc: Oral         Complications: No apparent anesthesia complications

## 2016-11-07 NOTE — H&P (Signed)
56 yof referred by Dr Maury Dus presents with a new right breast mass. she is wife of Dr Lynder Parents, psychiatrist in [place]. she underwent screening mm that showed right and left breast masses. density is b. she had no mass or discharge. she then underwent dx views that showed bilateral masses. [place] then showed a right breast cyst measuring 1.6 cm. in uoq in right breast at 930 5 and 2 cm from nac there are two solid masses. larger mass is at 5 cm and measures 9 mm and smaller is 2 cm from nipple measuring 6 mm. [place] of right axilla shows normal nodes. the [place] of left breast shows a cyst. she underwent biopsy of two solid masses. 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   Past Surgical History Nance Pear, [place]; 11/01/2016 9:53 AM) Appendectomy  Breast Biopsy  Right. Foot Surgery  Right.  Diagnostic Studies History Nance Pear, [place]; 11/01/2016 9:53 AM) Colonoscopy  never Mammogram  within last year Pap Smear  1-5 years ago  Allergies Nance Pear, CMA; 11/01/2016 9:53 AM) No Known Drug Allergies 11/01/2016  Medication History Nance Pear, CMA; 11/01/2016 9:56 AM) Wellbutrin XL (300MG  Tablet ER 24HR, Oral daily) Active. Geodon (20MG  Capsule, Oral twice a day) Active. Geodon (80MG  Capsule, Oral daily) Active. Vyvanse (40MG  Capsule, Oral daily) Active. LaMICtal (150MG  Tablet, Oral daily) Active. Medications Reconciled  Social History Nance Pear, [place]; 11/01/2016 9:53 AM) Alcohol use  Moderate alcohol use. Caffeine use  Carbonated beverages. No drug use  Tobacco use  Never smoker.  Family History Nance Pear, [place]; 11/01/2016 9:53 AM) Hypertension  Mother. Kidney Disease  Brother.  Pregnancy / Birth History Nance Pear, [place]; 11/01/2016 9:53 AM) Age at menarche  56 years. Age of menopause  <46 Contraceptive History  Oral contraceptives. Gravida  3 Length (months) of breastfeeding  7-12 Maternal age   40-25 Para  31  Other Problems Nance Pear, [place]; 11/01/2016 9:53 AM) Depression  Migraine Headache     Review of Systems Nance Pear CMA; 11/01/2016 9:53 AM) General Not Present- Appetite Loss, Chills, Fatigue, Fever, Night Sweats, Weight Gain and Weight Loss. Skin Not Present- Change in Wart/Mole, Dryness, Hives, Jaundice, New Lesions, Non-Healing Wounds, Rash and Ulcer. HEENT Present- Hearing Loss. Not Present- Earache, Hoarseness, Nose Bleed, Oral Ulcers, Ringing in the Ears, Seasonal Allergies, Sinus Pain, Sore Throat, Visual Disturbances, Wears glasses/contact lenses and Yellow Eyes. Respiratory Not Present- Bloody sputum, Chronic Cough, Difficulty Breathing, Snoring and Wheezing. Breast Present- Breast Mass. Not Present- Breast Pain, Nipple Discharge and Skin Changes. Cardiovascular Not Present- Chest Pain, Difficulty Breathing Lying Down, Leg Cramps, Palpitations, Rapid Heart Rate, Shortness of Breath and Swelling of Extremities. Gastrointestinal Not Present- Abdominal Pain, Bloating, Bloody Stool, Change in Bowel Habits, Chronic diarrhea, Constipation, Difficulty Swallowing, Excessive gas, Gets full quickly at meals, Hemorrhoids, Indigestion, Nausea, Rectal Pain and Vomiting. Female Genitourinary Not Present- Frequency, Nocturia, Painful Urination, Pelvic Pain and Urgency. Musculoskeletal Not Present- Back Pain, Joint Pain, Joint Stiffness, Muscle Pain, Muscle Weakness and Swelling of Extremities. Neurological Not Present- Decreased Memory, Fainting, Headaches, Numbness, Seizures, Tingling, Tremor, Trouble walking and Weakness. Psychiatric Present- Bipolar and Depression. Not Present- Anxiety, Change in Sleep Pattern, Fearful and Frequent crying. Endocrine Not Present- Cold Intolerance, Excessive Hunger, Hair Changes, Heat Intolerance, Hot flashes and New Diabetes. Hematology Not Present- Blood Thinners, Easy Bruising, Excessive bleeding, Gland problems, HIV and Persistent  Infections.  Vitals Bary Castilla Bradford CMA; 11/01/2016 9:56 AM) 11/01/2016 9:56 AM Weight:  162.2 lb Height: 63in Body Surface Area: 1.77 m Body Mass Index: 28.73 kg/m  Temp.: 98.29F  Pulse: 93 (Regular)  BP: 132/88 (Sitting, Left Arm, Standard)   Physical Exam Rolm Bookbinder MD; 11/01/2016 10:33 AM) General Mental Status-Alert. Orientation-Oriented X3.  Chest and Lung Exam Chest and lung exam reveals -on auscultation, normal breath sounds, no adventitious sounds and normal vocal resonance.  Breast Nipples-No Discharge. Breast Lump-No Palpable Breast Mass.  Cardiovascular Cardiovascular examination reveals -normal heart sounds, regular rate and rhythm with no murmurs.  Lymphatic Head & Neck  General Head & Neck Lymphatics: Bilateral - Description - Normal. Axillary  General Axillary Region: Bilateral - Description - Normal. Note: no [place] adenopathy   Assessment & Plan Rolm Bookbinder MD; 11/01/2016 10:40 AM) BREAST MASS, RIGHT (N63.10) Story: right breast seed guided excisional biopsy discussed options including observation and excision. discussed rationale for excision. we have elected to undergo excision due to possibility of upgrade and she would be amenable to discussing risk reduction. we discussed surgery, risks of surgery and recovery. will proceed soon.

## 2016-11-07 NOTE — Anesthesia Procedure Notes (Signed)
Procedure Name: LMA Insertion Date/Time: 11/07/2016 9:08 AM Performed by: Toula Moos L Pre-anesthesia Checklist: Patient identified, Emergency Drugs available, Suction available, Patient being monitored and Timeout performed Patient Re-evaluated:Patient Re-evaluated prior to inductionOxygen Delivery Method: Circle system utilized Preoxygenation: Pre-oxygenation with 100% oxygen Intubation Type: IV induction Ventilation: Mask ventilation without difficulty LMA: LMA inserted LMA Size: 4.0 Number of attempts: 1 Airway Equipment and Method: Bite block Placement Confirmation: positive ETCO2 Tube secured with: Tape Dental Injury: Teeth and Oropharynx as per pre-operative assessment

## 2016-11-07 NOTE — Interval H&P Note (Signed)
History and Physical Interval Note:  11/07/2016 8:56 AM  Kimberly Haynes  has presented today for surgery, with the diagnosis of RIGHT BREAST MASS  The various methods of treatment have been discussed with the patient and family. After consideration of risks, benefits and other options for treatment, the patient has consented to  Procedure(s): RADIOACTIVE SEED GUIDED EXCISIONAL BREAST BIOPSY (Right) as a surgical intervention .  The patient's history has been reviewed, patient examined, no change in status, stable for surgery.  I have reviewed the patient's chart and labs.  Questions were answered to the patient's satisfaction.     [name]

## 2016-11-07 NOTE — Op Note (Signed)
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 Serita Grammes Anesthesia: Gen. Estimated blood loss: Minimal Complications: None Drains: None Specimens:Right breast tissue marked with paint Sponge and needle count correct at completion Disposition to recovery stable  Indications: This is a 29yof who underwent mm with abnormality that underwent core biopsy that was papilloma. We discussed options of observation vs excision. We discussed excisional biopsy using seed guidance. She had seed placed prior to beginning and the mammogramswere in the room.   Procedure: After informed consent was obtained she was then taken to the operating room. She was given cefazolin. Sequential compression devices were on her legs. She was placed under general anesthesia without complication. Her right breast was then prepped and draped in the standard sterile surgical fashion. A surgical timeout was then performed.  I located the radioactive seed with the neoprobe. I infiltrated marcaine in the periareolar area and then made a periareolar incision to hide the scar.  I then used the neoprobe to guide the excision of the seed and surrounding tissue.This was confirmed by the neoprobe. This was then taken for mammogram which confirmed removal of the seed and the clip. This was confirmed by radiology. This was then sent to pathology.  Hemostasis was observed.I closed the breast tissue with a 2-0 Vicryl. The dermis was closed with 3-0 Vicryl and the skin with 5-0 Monocryl.Dermabond and steristrips were placed on the incision. A breast binder was placed. She was transferred to recovery stable

## 2016-11-07 NOTE — Discharge Instructions (Signed)
PATIENT INSTRUCTIONS POST-ANESTHESIA  IMMEDIATELY FOLLOWING SURGERY:  Do not drive or operate machinery for the first twenty four hours after surgery.  Do not make any important decisions for twenty four hours after surgery or while taking narcotic pain medications or sedatives.  If you develop intractable nausea and vomiting or a severe headache please notify your doctor immediately.  FOLLOW-UP:  Please make an appointment with your surgeon as instructed. You do not need to follow up with anesthesia unless specifically instructed to do so.  WOUND CARE INSTRUCTIONS (if applicable):  Keep a dry clean dressing on the anesthesia/puncture wound site if there is drainage.  Once the wound has quit draining you may leave it open to air.  Generally you should leave the bandage intact for twenty four hours unless there is drainage.  If the epidural site drains for more than 36-48 hours please call the anesthesia department.  QUESTIONS?:  Please feel free to call your physician or the hospital operator if you have any questions, and they will be happy to assist you.      [facility] Office Phone Number [phone]   POST OP INSTRUCTIONS  Always review your discharge instruction sheet given to you by the facility where your surgery was performed.  IF YOU HAVE DISABILITY OR FAMILY LEAVE FORMS, YOU MUST BRING THEM TO THE OFFICE FOR PROCESSING.  DO NOT GIVE THEM TO YOUR DOCTOR.  1. A prescription for pain medication may be given to you upon discharge.  Take your pain medication as prescribed, if needed.  If narcotic pain medicine is not needed, then you may take acetaminophen (Tylenol), naprosyn (Alleve) or ibuprofen (Advil) as needed. 2. Take your usually prescribed medications unless otherwise directed 3. If you need a refill on your pain medication, please contact your pharmacy.  They will contact our office to request authorization.  Prescriptions will not be filled after 5pm or on  week-ends. 4. You should eat very light the first 24 hours after surgery, such as soup, crackers, pudding, etc.  Resume your normal diet the day after surgery. 5. Most patients will experience some swelling and bruising in the breast.  Ice packs and a good support bra will help.  Wear the breast binder provided or a sports bra for 72 hours day and night.  After that wear a sports bra during the day until you return to the office. Swelling and bruising can take several days to resolve.  6. It is common to experience some constipation if taking pain medication after surgery.  Increasing fluid intake and taking a stool softener will usually help or prevent this problem from occurring.  A mild laxative (Milk of Magnesia or Miralax) should be taken according to package directions if there are no bowel movements after 48 hours. 7. Unless discharge instructions indicate otherwise, you may remove your bandages 48 hours after surgery and you may shower at that time.  You may have steri-strips (small skin tapes) in place directly over the incision.  These strips should be left on the skin for 7-10 days and will come off on their own.  If your surgeon used skin glue on the incision, you may shower in 24 hours.  The glue will flake off over the next 2-3 weeks.  Any sutures or staples will be removed at the office during your follow-up visit. 8. ACTIVITIES:  You may resume regular daily activities (gradually increasing) beginning the next day.  Wearing a good support bra or sports bra minimizes  pain and swelling.  You may have sexual intercourse when it is comfortable. a. You may drive when you no longer are taking prescription pain medication, you can comfortably wear a seatbelt, and you can safely maneuver your car and apply brakes. b. RETURN TO WORK:  ______________________________________________________________________________________ 9. You should see your doctor in the office for a follow-up appointment  approximately two weeks after your surgery.  Your doctors nurse will typically make your follow-up appointment when she calls you with your pathology report.  Expect your pathology report 3-4 business days after your surgery.  You may call to check if you do not hear from [place] after three days. 10. OTHER INSTRUCTIONS: _______________________________________________________________________________________________ _____________________________________________________________________________________________________________________________________ _____________________________________________________________________________________________________________________________________ _____________________________________________________________________________________________________________________________________  WHEN TO CALL DR WAKEFIELD: 1. Fever over 101.0 2. Nausea and/or vomiting. 3. Extreme swelling or bruising. 4. Continued bleeding from incision. 5. Increased pain, redness, or drainage from the incision.  The clinic staff is available to answer your questions during regular business hours.  Please dont hesitate to call and ask to speak to one of the nurses for clinical concerns.  If you have a medical emergency, go to the nearest emergency room or call 911.  A surgeon from [facility] is always on call at the hospital.  For further questions, please visit [email or web address]

## 2016-11-08 ENCOUNTER — Encounter (HOSPITAL_BASED_OUTPATIENT_CLINIC_OR_DEPARTMENT_OTHER): Payer: Self-pay | Admitting: General Surgery

## 2016-11-13 ENCOUNTER — Ambulatory Visit (HOSPITAL_COMMUNITY)
Admission: RE | Admit: 2016-11-13 | Discharge: 2016-11-13 | Disposition: A | Discharge status: Home / Self Care | Payer: 59 | Source: Ambulatory Visit | Attending: Otolaryngology | Admitting: Otolaryngology

## 2016-11-13 DIAGNOSIS — R938 Abnormal findings on diagnostic imaging of other specified body structures: Secondary | ICD-10-CM | POA: Diagnosis not present

## 2016-11-13 DIAGNOSIS — R918 Other nonspecific abnormal finding of lung field: Secondary | ICD-10-CM | POA: Diagnosis not present

## 2016-11-13 DIAGNOSIS — H903 Sensorineural hearing loss, bilateral: Secondary | ICD-10-CM | POA: Insufficient documentation

## 2016-11-13 DIAGNOSIS — H9193 Unspecified hearing loss, bilateral: Secondary | ICD-10-CM | POA: Diagnosis not present

## 2016-11-17 HISTORY — PX: BREAST EXCISIONAL BIOPSY: SUR124

## 2016-11-21 DIAGNOSIS — Z23 Encounter for immunization: Secondary | ICD-10-CM | POA: Diagnosis not present

## 2016-11-21 DIAGNOSIS — R9431 Abnormal electrocardiogram [ECG] [EKG]: Secondary | ICD-10-CM | POA: Diagnosis not present

## 2016-11-21 DIAGNOSIS — R748 Abnormal levels of other serum enzymes: Secondary | ICD-10-CM | POA: Diagnosis not present

## 2016-11-21 DIAGNOSIS — Z01818 Encounter for other preprocedural examination: Secondary | ICD-10-CM | POA: Diagnosis not present

## 2016-11-24 ENCOUNTER — Other Ambulatory Visit: Payer: Self-pay | Admitting: Family Medicine

## 2016-11-24 ENCOUNTER — Ambulatory Visit
Admission: RE | Admit: 2016-11-24 | Discharge: 2016-11-24 | Disposition: A | Discharge status: Home / Self Care | Payer: 59 | Source: Ambulatory Visit | Attending: Family Medicine | Admitting: Family Medicine

## 2016-11-24 DIAGNOSIS — Z01818 Encounter for other preprocedural examination: Secondary | ICD-10-CM | POA: Diagnosis not present

## 2016-11-28 DIAGNOSIS — R9431 Abnormal electrocardiogram [ECG] [EKG]: Secondary | ICD-10-CM | POA: Diagnosis not present

## 2016-11-28 DIAGNOSIS — Z01810 Encounter for preprocedural cardiovascular examination: Secondary | ICD-10-CM | POA: Diagnosis not present

## 2016-11-29 MED FILL — lamoTRIgine 100 MG TABS: 100 | 90 days supply | Qty: 90 | Fill #0

## 2016-11-30 MED FILL — VYVANSE 40 MG CAPSULE: 40 | 90 days supply | Qty: 90 | Fill #0

## 2016-12-18 ENCOUNTER — Encounter (HOSPITAL_BASED_OUTPATIENT_CLINIC_OR_DEPARTMENT_OTHER): Payer: Self-pay | Admitting: *Deleted

## 2016-12-18 DIAGNOSIS — R9431 Abnormal electrocardiogram [ECG] [EKG]: Secondary | ICD-10-CM | POA: Diagnosis not present

## 2016-12-18 MED FILL — ZIPRASIDONE HCL 20 MG CAP: 20 | 90 days supply | Qty: 180 | Fill #1

## 2016-12-18 MED FILL — BUPROPION HCL XL 300 MG TAB: 300 | 90 days supply | Qty: 90 | Fill #1

## 2016-12-18 MED FILL — ZIPRASIDONE HCL 80 MG CAP: 80 | 90 days supply | Qty: 90 | Fill #1

## 2016-12-25 MED FILL — ZALEPLON 10 MG CAPSULE: 10 | 90 days supply | Qty: 180 | Fill #1

## 2016-12-25 NOTE — H&P (Signed)
Kimberly Haynes is an 56 y.o. female.   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 HPI: See H&P below  History & Physical Examination   Patient:  Kimberly Haynes  Date of Birth: February 15, 1961  Provider: Vicie Mutters, MD, MS, FACS  Date of Service:  Dec 13, 2016  Location: [facility] [address] [place]                  [place], [place]   [id number]                                Ph: [phone], Fax: [phone]                  [email or web address]     Provider: Vicie Mutters, MD, MS, FACS Encounter Date: Dec 13, 2016 Patient: Kimberly, Haynes    [id number]) Sex: Female       DOB: [birth year]      Age: 56 year 4 month 2 week       Race: White Address: [address]  [place]  [place]  [address]    Pref. Phone(H): [phone] Primary Dr.: Sadie Haber [employer] Insurance(s):  [employer] (PP)   [place] CT: Type: procedure  code: [phone]  desc:Documentation of current medications (procedure)  Visit Type: Kimberly Haynes, a 56 year 4 month 2 week White female is here today for a pre-operative visit.  Complaint/HPI: The patient was here today with her husband for a preoperative examination prior to undergoing a left Nucleus Profile cochlear implant with a slim modiolar electrode. The patient denied any recent upper respiratory tract infections, cough, or fever. She has been evaluated by Dr. Wynonia Lawman of cardiology and has been cleared for anesthesia. We are awaiting [employer] confirmation of insurance coverage. They have been dragging their feet.  Previous history: The patient was here today for evaluation of her ears and hearing. She is now interested in a possible cochlear implant now that her daughter, Kimberly Haynes, is undergone a successful cochlear implant with a nucleus device. Her husband has also been encouraging her to think about a cochlear implant. She is wearing binaural  digital BTE hearing aids and her hearing has become progressively worse over the years. She denied any otorrhea, otalgia, or vertigo today.  Previous history: The patient was here today for follow-up of her severe sensorineural hearing loss. She is well adapted to her current hearing aids and is not interested in a cochlear implant. She has not noticed any recent decrease in hearing in either ear. She denied tinnitus or vertigo. She communicates with her family by texting.   Current Medication: 1. Lamictal Xr 300 Mg Tablet (Other MD)  2. Vyvanse 40 Mg Capsule (Other MD)  3. Wellbutrin Xl 300 Mg Tablet (Other MD)  4. Geodon 20 Mg Capsule (Other MD)   Medical History: Past medical history is unremarkable.  Vaccinations: Flu vaccinations: Yes, flu shot - since Jul 30, 2016- code [phone].  Pneumonia vaccination: Yes - code 4040F.  Family History: The patient's family history is noncontributory.  Social History: Smoking: Her current smoking status is never smoker/non-smoker. Alcohol: Patient drinks alcoholic beverages. Marital Status: Patient is married. HIV status: (-) HIV status. Recreational Drug Use: She denies recreational drug use.  Allergy:  No Known Drug Allergies  ROS: General: (-) fever, (-) chills, (-) night sweats, (-) fatigue, (-) weakness, (-) changes in appetite or weight. (-) allergies, (-) not immunocompromised. Head: (-) headaches, (-) head injury or deformity. Eyes: (-) visual changes, (-) eye pain, (-) eye discharges, (-) redness, (-) itching, (-) excessive tearing, (-) double or blurred vision, (-) glaucoma, (-) cataracts. Ears: (+) hearing loss. Nose and Sinuses: (-) frequent colds, (-) nasal stuffiness or itchiness, (-) postnasal drip, (-) hay fever, (-) nosebleeds, (-) sinus trouble. Mouth and Throat: (-) bleeding gums, (-) toothache, (-) odd taste sensations, (-) sores on tongue, (-) frequent sore throat, (-) hoarseness. Neck: (-) swollen glands, (-) enlarged  thyroid, (-) neck pain. Cardiac: (-) chest pain, (-) edema, (-) high blood pressure, (-) irregular heartbeat, (-) orthopnea, (-) palpitations, (-) paroxysmal nocturnal dyspnea, (-) shortness of breath. Respiratory: (-) cough, (-) hemoptysis, (-) shortness of breath, (-) cyanosis, (-) wheezing, (-) nocturnal choking or gasping, (-) TB exposure. Breasts: (-) nipple discharge, (-) breast lumps, (-) breast pain. Gastrointestinal: (-) abdominal pain, (-) heartburn, (-) constipation, (-) diarrhea, (-) nausea, (-) vomiting, (-) hematochezia, (-) melena, (-) change in bowel habits. Urinary: (-) dysuria, (-) frequency, (-) urgency, (-) hesitancy, (-) polyuria, (-) nocturia, (-) hematuria, (-) urinary incontinence, (-) flank pain, (-) change in urinary habits. Gynecologic/Urologic: (-) genital sores or lesions, (-) history of STD, (-) sexual difficulties. Musculoskeletal: (-) muscle pain, (-) joint pain, (-) bone pain. Peripheral Vascular: (-) intermittent claudication, (-) cramps, (-) varicose veins, (-) thrombophlebitis. Neurological: (-) numbness, (-) tingling, (-) tremors, (-) seizures, (-) vertigo, (-) dizziness, (-) memory loss, (-) any focal or diffuse neurological deficits. Psychiatric: (-) anxiety, (-) depression, (-) sleep disturbance, (-) irritability, (-) mood swings, (-) suicidal thoughts or ideations. Endocrine: (-) heat or cold intolerance, (-) excessive sweating, (-) diabetes, (-) excessive thirst, (-) excessive hunger, (-) excessive urination, (-) hirsutism, (-) change in ring or shoe size. Hematologic/Lymphatic: (-) anemia, (-) easy bruising, (-) excessive bleeding, (-) history of blood transfusions. Skin: (-) rashes, (-) lumps, (-) itching, (-) dryness, (-) acne, (-) discoloration, (-) recurrent skin infections, (-) changes in hair, nails or moles.  Vital Signs: Weight:   164 lbs Height:   5\' 2"  BMI:   29.99 BSA:   1.80  Examination: Prior to the examination, I have reviewed: (1) the  patient's current medications and allergies, (2) medical, family, and social histories, (3) review of systems, and (4) vital signs.  General Appearance - Adult: The patient is a well-developed, well-nourished, female, has no recognizable syndromes or patterns of malformation, and is in no acute distress. She is awake, alert, coherent, spontaneous, and logical. She is oriented to time, place, and person and communicates without difficulty.  Head: The patient's head was normocephalic and without any evidence of trauma or lesions.  Face: Her facial motion was intact and symmetric bilaterally with normal resting facial tone and voluntary facial power.  Skin: Gross inspection of her facial skin demonstrated no evidence of abnormality.  Eyes: Her pupils are equal, regular, reactive to light and accommodate (PERRLA). Extraocular movements were intact (EOMI). Conjunctivae were normal. There was no sclera icterus. There was no nystagmus. Eyelids appeared normal. There was no ptosis, lid lag, lid edema, or lagophthalmos.  External ears: Both of her external ears were normal in size, shape, angulation, and location.  External auditory canals: Examination of the external auditory canals revealed a cerumen impaction of both EAC's.  Procedure: Cerumen Removal - Using the microscope and microinstruments, cerumen impactions were removed  atraumatically from both EAC's. Both canals were stable and without signs of infection. The patient tolerated the procedure well.  Right Tympanic Membrane: The right tympanic membrane was clear and mobile. There was an air containing right middle ear space.  Left Tympanic Membrane: The left tympanic membrane was clear and mobile. There was an air containing left middle ear space.  Audiology Procedures: Audiogram/Graph Audiogram: I have referred the patient for audiometric testing. I have reviewed the patient's audiogram. (EMK).  Procedure:  Patient was placed in a special  soundproof testing booth with earphones. Sounds were passed through the earphones. Each ear was tested separately. The patient was asked which part of the ear sound was heard. Earphones were removed, bone conduction hearing was then tested by having a vibrating instrument held close against the patient's ear. Patient has had extensive audiometric testing. Her last formal audiogram showed an SRT of 80 DB AD and 100 DB AS with 32% discrimination AD and 12% discrimination AS.  Impression: Other:  1. Severe asymmetric sensorineural hearing loss, right ear with 32% discrimination in the left ear with 12% discrimination. The patient had an SRT of 80 DB AD and 100 DB AS. 2. Recommend proceeding with a left cochlear Americas nucleus profile CI with a slim modiolar electrode, four hours, [facility] day surgery, general endotracheal anesthesia, with a 23 hour recovery care stay. 3. Risks, complications, and alternatives of cochlear implantation were explained to the patient. The patient also read our cochlear implant informed consent and both she and her husband signed the informed consent. She was given a copy. Questions were invited and answered. Preoperative teaching and counseling were provided. 4. Preoperative anesthesia clearance has been obtained from Dr. Tollie Eth of cardiology. 5. CT scanning of her temporal bones has been completed and she has patent. Cochleae. 6. She has had a Pneumovax vaccine.  Plan: Clinical summary letter made available to patient today. This letter may not be complete at time of service. Please contact our office within 3 days for a completed summary of today's visit.  Status: stable. Medications: None required.  Diet: no restriction. Procedure: Cochlear implant: Left ear. Duration:  4 hours. Surgeon: Fannie Knee MD Office Phone: [phone] Office Fax: [phone] Cell Phone: [phone]. Anesthesia Required: General. Equipment:  Mastoid instruments NIM  monitor Stryker drill. Recovery Care Center: yes. Latex Allergy: no.  Informed consent: Informed consent for a cochlear implant was provided. The consent was provided in an office examination room. The patient husband was given our written Cochlear Implant Informed Consent to read because they could not hear. The recommended procedure(s) was/were explained to the patient husband. Advantages, disadvantages, and options were reviewed, including doing nothing. Questions were invited and answered. Preoperative teaching and counseling were provided. The time and date of the procedure were reviewed. No guarantees were implied or made that cochlear implantation would be able to provide any hearing or useful hearing. Informed consent - status: Informed consent was provided and was signed and witnessed.  Recommendations: The patient should wear a hearing aid in the right ear. Follow-Up: Postoperative visit as scheduled.  Diagnosis: H90.3  Sensorineural hearing loss, bilateral   Careplan: (1)  a BMI - HNS14 Body Mass Index (BMI)  Follow-up plan given.  We have noticed an issue with your BMI (Body Mass Index). We recommend that you contact your family physician. (2) a Hypertension/Hypotension - HNS16 Blood Pressure: High Blood Pressure (Hypertension) or Low Blood Pressure ( Hypotension) Follow up plan given.  We  have noticed an issue with your blood pressure.  We recommend that you contact your family physician. (3) Cochlear Implants (4) Hearing Loss (5) Postop Tympanomastoid/cochlear implant  Followup: Postop visit   This visit note has been electronically signed off by Vicie Mutters, MD, MS, FACS on 12/14/2016 at 11:48 AM.       Next Appointment: 12/27/2016 at 08:30 AM     Past Medical History:  Diagnosis Date  . Bipolar disorder ([place])   . Depression   . Hearing impairment   . Sleep apnea    uses dental appliance    Past Surgical History:  Procedure Laterality Date  .  APPENDECTOMY    . RADIOACTIVE SEED GUIDED EXCISIONAL BREAST BIOPSY Right 11/07/2016   Procedure: RADIOACTIVE SEED GUIDED EXCISIONAL BREAST BIOPSY;  Surgeon: Rolm Bookbinder, MD;  Location: [facility];  Service: General;  Laterality: Right;  . TUBAL LIGATION      Family History  Problem Relation Age of Onset  . Heart disease Maternal Grandfather   . Heart disease Paternal Grandmother    Social History:  reports that she has never smoked. She has never used smokeless tobacco. She reports that she drinks alcohol. She reports that she does not use drugs.  Allergies:  Allergies  Allergen Reactions  . Sulfa Antibiotics Nausea Only    No prescriptions prior to admission.    No results found for this or any previous visit (from the past 48 hour(s)). No results found.  Review of Systems  Constitutional: Negative.   HENT: Positive for hearing loss.   Eyes: Negative.   Respiratory: Negative.   Cardiovascular: Negative.   Gastrointestinal: Negative.   Genitourinary: Negative.   Musculoskeletal: Negative.   Skin: Negative.   Neurological: Negative.   Endo/Heme/Allergies: Negative.     Height 5\' 1"  (1.549 m), weight 73.9 kg (163 lb). Physical Exam   Assessment/Plan 1. 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 2. The patient has been recommended for a left Cochlear [employer] Nucleus Profile [phone] cochlear implant. Risks, complications, and alternatives of the procedure have been explained to her and to her husband. Patient has read our cochlear implant informed consent and has signed the informed consent. Questions have been invited and answered. Informed consent has been signed and witnessed. Preoperative teaching and counseling have been provided. 3. The operation is scheduled for Wednesday, December 27, 2016 at [facility], at 8:30 AM.  Thornell Mule, Ayisha Pol M, MD 12/25/2016, 12:15 PM

## 2016-12-27 ENCOUNTER — Encounter (HOSPITAL_BASED_OUTPATIENT_CLINIC_OR_DEPARTMENT_OTHER): Payer: Self-pay | Admitting: *Deleted

## 2016-12-27 ENCOUNTER — Ambulatory Visit (HOSPITAL_BASED_OUTPATIENT_CLINIC_OR_DEPARTMENT_OTHER): Payer: 59 | Admitting: Anesthesiology

## 2016-12-27 ENCOUNTER — Encounter (HOSPITAL_BASED_OUTPATIENT_CLINIC_OR_DEPARTMENT_OTHER)
Admission: RE | Disposition: A | Discharge status: Home / Self Care | Payer: Self-pay | Source: Ambulatory Visit | Attending: Otolaryngology

## 2016-12-27 ENCOUNTER — Ambulatory Visit (HOSPITAL_BASED_OUTPATIENT_CLINIC_OR_DEPARTMENT_OTHER)
Admission: RE | Admit: 2016-12-27 | Discharge: 2016-12-28 | Disposition: A | Discharge status: Home / Self Care | Payer: 59 | Source: Ambulatory Visit | Attending: Otolaryngology | Admitting: Otolaryngology

## 2016-12-27 ENCOUNTER — Ambulatory Visit (HOSPITAL_COMMUNITY): Payer: 59

## 2016-12-27 DIAGNOSIS — Z882 Allergy status to sulfonamides status: Secondary | ICD-10-CM | POA: Diagnosis not present

## 2016-12-27 DIAGNOSIS — Z419 Encounter for procedure for purposes other than remedying health state, unspecified: Secondary | ICD-10-CM

## 2016-12-27 DIAGNOSIS — G4733 Obstructive sleep apnea (adult) (pediatric): Secondary | ICD-10-CM | POA: Diagnosis not present

## 2016-12-27 DIAGNOSIS — F319 Bipolar disorder, unspecified: Secondary | ICD-10-CM | POA: Insufficient documentation

## 2016-12-27 DIAGNOSIS — H903 Sensorineural hearing loss, bilateral: Secondary | ICD-10-CM | POA: Diagnosis not present

## 2016-12-27 DIAGNOSIS — I10 Essential (primary) hypertension: Secondary | ICD-10-CM | POA: Insufficient documentation

## 2016-12-27 DIAGNOSIS — G8918 Other acute postprocedural pain: Secondary | ICD-10-CM | POA: Diagnosis present

## 2016-12-27 DIAGNOSIS — Z9621 Cochlear implant status: Secondary | ICD-10-CM | POA: Diagnosis not present

## 2016-12-27 DIAGNOSIS — G4730 Sleep apnea, unspecified: Secondary | ICD-10-CM | POA: Insufficient documentation

## 2016-12-27 HISTORY — DX: Major depressive disorder, single episode, unspecified: F32.9

## 2016-12-27 HISTORY — DX: Bipolar disorder, unspecified: F31.9

## 2016-12-27 HISTORY — DX: Depression, unspecified: F32.A

## 2016-12-27 HISTORY — PX: COCHLEAR IMPLANT: SHX184

## 2016-12-27 SURGERY — INSERTION, IMPLANT, COCHLEAR
Anesthesia: General | Laterality: Left

## 2016-12-27 MED ORDER — DEXTROSE IN LACTATED RINGERS 5 % IV SOLN
INTRAVENOUS | Status: DC
  Administered 2016-12-27 – 2016-12-28 (×3): via INTRAVENOUS

## 2016-12-27 MED ORDER — SCOPOLAMINE 1 MG/3DAYS TD PT72
MEDICATED_PATCH | TRANSDERMAL | Status: AC
  Filled 2016-12-27: qty 1

## 2016-12-27 MED ORDER — HYDROCODONE-ACETAMINOPHEN 5-325 MG PO TABS
1.0000 | ORAL_TABLET | ORAL | Status: DC | PRN
  Administered 2016-12-27: 2 via ORAL
  Administered 2016-12-27 (×2): 1 via ORAL
  Filled 2016-12-27 (×4): qty 1

## 2016-12-27 MED ORDER — CEFAZOLIN SODIUM-DEXTROSE 2-4 GM/100ML-% IV SOLN
INTRAVENOUS | Status: AC
  Filled 2016-12-27: qty 100

## 2016-12-27 MED ORDER — FENTANYL CITRATE (PF) 100 MCG/2ML IJ SOLN
INTRAMUSCULAR | Status: AC
  Filled 2016-12-27: qty 4

## 2016-12-27 MED ORDER — LAMOTRIGINE 150 MG PO TABS: 150.0000 mg | ORAL_TABLET | Freq: Every day | ORAL | Status: DC

## 2016-12-27 MED ORDER — DEXAMETHASONE SODIUM PHOSPHATE 10 MG/ML IJ SOLN
INTRAMUSCULAR | Status: DC | PRN
  Administered 2016-12-27: .5 mL

## 2016-12-27 MED ORDER — FENTANYL CITRATE (PF) 100 MCG/2ML IJ SOLN
50.0000 ug | INTRAMUSCULAR | Status: AC | PRN
  Administered 2016-12-27: 50 ug via INTRAVENOUS
  Administered 2016-12-27: 25 ug via INTRAVENOUS
  Administered 2016-12-27: 50 ug via INTRAVENOUS
  Administered 2016-12-27: 25 ug via INTRAVENOUS
  Administered 2016-12-27: 50 ug via INTRAVENOUS

## 2016-12-27 MED ORDER — MIDAZOLAM HCL 2 MG/2ML IJ SOLN
INTRAMUSCULAR | Status: AC
  Filled 2016-12-27: qty 2

## 2016-12-27 MED ORDER — CEFAZOLIN IN D5W 1 GM/50ML IV SOLN
1.0000 g | Freq: Three times a day (TID) | INTRAVENOUS | Status: AC
  Administered 2016-12-27 – 2016-12-28 (×3): 1 g via INTRAVENOUS
  Filled 2016-12-27 (×3): qty 50

## 2016-12-27 MED ORDER — LIDOCAINE 2% (20 MG/ML) 5 ML SYRINGE
INTRAMUSCULAR | Status: DC | PRN
  Administered 2016-12-27: 60 mg via INTRAVENOUS

## 2016-12-27 MED ORDER — MORPHINE SULFATE (PF) 2 MG/ML IV SOLN: 2.0000 mg | INTRAVENOUS | Status: DC | PRN

## 2016-12-27 MED ORDER — ONDANSETRON HCL 4 MG/2ML IJ SOLN
4.0000 mg | INTRAMUSCULAR | Status: AC
  Administered 2016-12-27: 4 mg via INTRAVENOUS

## 2016-12-27 MED ORDER — METHYLENE BLUE 0.5 % INJ SOLN
INTRAVENOUS | Status: DC | PRN
  Administered 2016-12-27: .25 mL

## 2016-12-27 MED ORDER — BUPROPION HCL ER (XL) 300 MG PO TB24: 300.0000 mg | ORAL_TABLET | Freq: Every day | ORAL | Status: DC

## 2016-12-27 MED ORDER — PROMETHAZINE HCL 25 MG/ML IJ SOLN
INTRAMUSCULAR | Status: AC
  Filled 2016-12-27: qty 1

## 2016-12-27 MED ORDER — LIDOCAINE-EPINEPHRINE 2 %-1:100000 IJ SOLN
INTRAMUSCULAR | Status: AC
  Filled 2016-12-27: qty 3.4

## 2016-12-27 MED ORDER — FENTANYL CITRATE (PF) 100 MCG/2ML IJ SOLN
INTRAMUSCULAR | Status: AC
  Filled 2016-12-27: qty 2

## 2016-12-27 MED ORDER — CIPROFLOXACIN-HYDROCORTISONE 0.2-1 % OT SUSP
OTIC | Status: DC | PRN
  Administered 2016-12-27: 3 [drp] via OTIC

## 2016-12-27 MED ORDER — DEXAMETHASONE SODIUM PHOSPHATE 10 MG/ML IJ SOLN
INTRAMUSCULAR | Status: AC
  Filled 2016-12-27: qty 1

## 2016-12-27 MED ORDER — BACITRACIN ZINC 500 UNIT/GM EX OINT
TOPICAL_OINTMENT | CUTANEOUS | Status: DC | PRN
  Administered 2016-12-27: 1 via TOPICAL

## 2016-12-27 MED ORDER — MUPIROCIN 2 % EX OINT
1.0000 "application " | TOPICAL_OINTMENT | Freq: Three times a day (TID) | CUTANEOUS | Status: DC
  Administered 2016-12-27 – 2016-12-28 (×2): 1 via TOPICAL

## 2016-12-27 MED ORDER — SODIUM CHLORIDE 0.9 % IR SOLN
Status: DC | PRN
  Administered 2016-12-27: 500 mL

## 2016-12-27 MED ORDER — LACTATED RINGERS IV SOLN: INTRAVENOUS | Status: DC

## 2016-12-27 MED ORDER — FENTANYL CITRATE (PF) 100 MCG/2ML IJ SOLN: 25.0000 ug | INTRAMUSCULAR | Status: DC | PRN

## 2016-12-27 MED ORDER — LACTATED RINGERS IV SOLN
INTRAVENOUS | Status: DC
  Administered 2016-12-27 (×2): via INTRAVENOUS
  Administered 2016-12-27: 10 mL/h via INTRAVENOUS
  Administered 2016-12-27 (×2): via INTRAVENOUS

## 2016-12-27 MED ORDER — LABETALOL HCL 5 MG/ML IV SOLN: 5.0000 mg | Freq: Once | INTRAVENOUS | Status: DC | PRN

## 2016-12-27 MED ORDER — ONDANSETRON HCL 4 MG/2ML IJ SOLN
INTRAMUSCULAR | Status: AC
  Filled 2016-12-27: qty 2

## 2016-12-27 MED ORDER — OXYCODONE HCL 5 MG PO TABS: 5.0000 mg | ORAL_TABLET | Freq: Once | ORAL | Status: DC | PRN

## 2016-12-27 MED ORDER — MEPERIDINE HCL 25 MG/ML IJ SOLN: 6.2500 mg | INTRAMUSCULAR | Status: DC | PRN

## 2016-12-27 MED ORDER — ZIPRASIDONE HCL 20 MG PO CAPS
20.0000 mg | ORAL_CAPSULE | Freq: Every morning | ORAL | Status: DC
  Administered 2016-12-27: 20 mg via ORAL

## 2016-12-27 MED ORDER — DEXAMETHASONE SODIUM PHOSPHATE 4 MG/ML IJ SOLN
10.0000 mg | INTRAMUSCULAR | Status: AC
  Administered 2016-12-27: 10 mg via INTRAVENOUS

## 2016-12-27 MED ORDER — SUCCINYLCHOLINE CHLORIDE 200 MG/10ML IV SOSY
PREFILLED_SYRINGE | INTRAVENOUS | Status: DC | PRN
  Administered 2016-12-27: 100 mg via INTRAVENOUS

## 2016-12-27 MED ORDER — ZIPRASIDONE HCL 60 MG PO CAPS: 60.0000 mg | ORAL_CAPSULE | ORAL | Status: DC

## 2016-12-27 MED ORDER — LISDEXAMFETAMINE DIMESYLATE 20 MG PO CAPS
40.0000 mg | ORAL_CAPSULE | ORAL | Status: DC
  Administered 2016-12-28: 40 mg via ORAL

## 2016-12-27 MED ORDER — LIDOCAINE 2% (20 MG/ML) 5 ML SYRINGE
INTRAMUSCULAR | Status: AC
  Filled 2016-12-27: qty 5

## 2016-12-27 MED ORDER — SCOPOLAMINE 1 MG/3DAYS TD PT72
1.0000 | MEDICATED_PATCH | Freq: Once | TRANSDERMAL | Status: DC | PRN
  Administered 2016-12-27: 1.5 mg via TRANSDERMAL

## 2016-12-27 MED ORDER — PROMETHAZINE HCL 25 MG/ML IJ SOLN
6.2500 mg | INTRAMUSCULAR | Status: DC | PRN
  Administered 2016-12-27: 6.25 mg via INTRAVENOUS

## 2016-12-27 MED ORDER — OXYCODONE HCL 5 MG/5ML PO SOLN: 5.0000 mg | Freq: Once | ORAL | Status: DC | PRN

## 2016-12-27 MED ORDER — DEXAMETHASONE SODIUM PHOSPHATE 10 MG/ML IJ SOLN
INTRAMUSCULAR | Status: DC | PRN
  Administered 2016-12-27: 10 mg

## 2016-12-27 MED ORDER — DEXAMETHASONE SODIUM PHOSPHATE 10 MG/ML IJ SOLN
6.0000 mg | Freq: Once | INTRAMUSCULAR | Status: AC
  Administered 2016-12-27: 6 mg via INTRAVENOUS
  Filled 2016-12-27: qty 1

## 2016-12-27 MED ORDER — CEFAZOLIN (ANCEF) 1 G IV SOLR
2.0000 g | INTRAVENOUS | Status: AC
  Administered 2016-12-27: 2 g

## 2016-12-27 MED ORDER — LIDOCAINE-EPINEPHRINE 2 %-1:100000 IJ SOLN
INTRAMUSCULAR | Status: DC | PRN
  Administered 2016-12-27: 7 mL

## 2016-12-27 MED ORDER — IBUPROFEN 100 MG/5ML PO SUSP
400.0000 mg | Freq: Four times a day (QID) | ORAL | Status: DC | PRN
  Administered 2016-12-27: 400 mg via ORAL

## 2016-12-27 MED ORDER — BACITRACIN ZINC 500 UNIT/GM EX OINT
TOPICAL_OINTMENT | CUTANEOUS | Status: AC
  Filled 2016-12-27: qty 28.35

## 2016-12-27 MED ORDER — METOCLOPRAMIDE HCL 5 MG/ML IJ SOLN
INTRAMUSCULAR | Status: DC | PRN
  Administered 2016-12-27: 10 mg via INTRAVENOUS

## 2016-12-27 MED ORDER — MUPIROCIN 2 % EX OINT
TOPICAL_OINTMENT | CUTANEOUS | Status: AC
  Filled 2016-12-27: qty 22

## 2016-12-27 MED ORDER — DIAZEPAM 5 MG PO TABS: 5.0000 mg | ORAL_TABLET | Freq: Three times a day (TID) | ORAL | Status: DC | PRN

## 2016-12-27 MED ORDER — DEXAMETHASONE SODIUM PHOSPHATE 10 MG/ML IJ SOLN
8.0000 mg | Freq: Once | INTRAMUSCULAR | Status: AC
  Administered 2016-12-27: 8 mg via INTRAVENOUS
  Filled 2016-12-27: qty 1

## 2016-12-27 MED ORDER — SUCCINYLCHOLINE CHLORIDE 200 MG/10ML IV SOSY
PREFILLED_SYRINGE | INTRAVENOUS | Status: AC
  Filled 2016-12-27: qty 10

## 2016-12-27 MED ORDER — ONDANSETRON HCL 4 MG PO TABS: 4.0000 mg | ORAL_TABLET | ORAL | Status: DC | PRN

## 2016-12-27 MED ORDER — ONDANSETRON HCL 4 MG/2ML IJ SOLN: 4.0000 mg | INTRAMUSCULAR | Status: DC | PRN

## 2016-12-27 MED ORDER — PROPOFOL 10 MG/ML IV BOLUS
INTRAVENOUS | Status: AC
  Filled 2016-12-27: qty 20

## 2016-12-27 MED ORDER — PROPOFOL 10 MG/ML IV BOLUS
INTRAVENOUS | Status: DC | PRN
  Administered 2016-12-27: 20 mg via INTRAVENOUS
  Administered 2016-12-27: 30 mg via INTRAVENOUS
  Administered 2016-12-27: 150 mg via INTRAVENOUS

## 2016-12-27 MED ORDER — ZOLPIDEM TARTRATE 5 MG PO TABS: 5.0000 mg | ORAL_TABLET | Freq: Every evening | ORAL | Status: DC | PRN

## 2016-12-27 MED ORDER — MIDAZOLAM HCL 2 MG/2ML IJ SOLN
1.0000 mg | INTRAMUSCULAR | Status: DC | PRN
  Administered 2016-12-27: 2 mg via INTRAVENOUS

## 2016-12-27 SURGICAL SUPPLY — 90 items
APL SKNCLS STERI-STRIP NONHPOA (GAUZE/BANDAGES/DRESSINGS) ×1
ATTRACTOMAT 16X20 MAGNETIC DRP (DRAPES) IMPLANT
BAG DECANTER FOR FLEXI CONT (MISCELLANEOUS) ×1 IMPLANT
BALL CTTN LRG ABS STRL LF (GAUZE/BANDAGES/DRESSINGS) ×1
BENZOIN TINCTURE PRP APPL 2/3 (GAUZE/BANDAGES/DRESSINGS) ×1 IMPLANT
BLADE CLIPPER SURG (BLADE) ×1 IMPLANT
BLADE NDL 3 SS STRL (BLADE) IMPLANT
BLADE NEEDLE 3 SS STRL (BLADE) ×2 IMPLANT
BUR ROUNG CUTTER 5.0 (BUR) ×1 IMPLANT
BUR SABER RD CUTTING 3.0 (BURR) ×1 IMPLANT
BUR SABER TAPERED DIAMOND 1 (BURR) ×1 IMPLANT
BUR SURG RND 2.0 DIAM (BURR) ×1 IMPLANT
CANISTER SUCT 1200ML W/VALVE (MISCELLANEOUS) ×2 IMPLANT
CORDS BIPOLAR (ELECTRODE) ×2 IMPLANT
COTTONBALL LRG STERILE PKG (GAUZE/BANDAGES/DRESSINGS) ×2 IMPLANT
COVER BACK TABLE 60X90IN (DRAPES) ×2 IMPLANT
COVER PROBE 5X48 (MISCELLANEOUS) ×2
DECANTER SPIKE VIAL GLASS SM (MISCELLANEOUS) ×1 IMPLANT
DEPRESSOR TONGUE BLADE STERILE (MISCELLANEOUS) ×2 IMPLANT
DRAIN JACKSON RD 7FR 3/32 (WOUND CARE) IMPLANT
DRAIN PENROSE 1/2X12 LTX STRL (WOUND CARE) IMPLANT
DRAIN PENROSE 1/4X12 LTX STRL (WOUND CARE) IMPLANT
DRAPE C-ARM 42X72 X-RAY (DRAPES) ×2 IMPLANT
DRAPE INCISE IOBAN 66X45 STRL (DRAPES) ×2 IMPLANT
DRAPE MICROSCOPE WILD 40.5X102 (DRAPES) ×2 IMPLANT
DRAPE SURG 17X23 STRL (DRAPES) ×2 IMPLANT
DRAPE U-SHAPE 76X120 STRL (DRAPES) IMPLANT
DRSG EMULSION OIL 3X3 NADH (GAUZE/BANDAGES/DRESSINGS) ×2 IMPLANT
DRSG GLASSCOCK MASTOID ADT (GAUZE/BANDAGES/DRESSINGS) ×1 IMPLANT
DRSG GLASSCOCK MASTOID PED (GAUZE/BANDAGES/DRESSINGS) IMPLANT
ELECT COATED BLADE 2.86 ST (ELECTRODE) ×2 IMPLANT
ELECT PAIRED SUBDERMAL (MISCELLANEOUS) ×2
ELECT REM PT RETURN 9FT ADLT (ELECTROSURGICAL) ×2
ELECTRODE PAIRED SUBDERMAL (MISCELLANEOUS) ×1 IMPLANT
ELECTRODE REM PT RTRN 9FT ADLT (ELECTROSURGICAL) ×1 IMPLANT
EVACUATOR SILICONE 100CC (DRAIN) IMPLANT
GAUZE SPONGE 4X4 12PLY STRL (GAUZE/BANDAGES/DRESSINGS) IMPLANT
GAUZE SPONGE 4X4 16PLY XRAY LF (GAUZE/BANDAGES/DRESSINGS) IMPLANT
GLOVE ECLIPSE 7.5 STRL STRAW (GLOVE) ×4 IMPLANT
GOWN STRL REUS W/ TWL LRG LVL3 (GOWN DISPOSABLE) ×2 IMPLANT
GOWN STRL REUS W/ TWL XL LVL3 (GOWN DISPOSABLE) ×2 IMPLANT
GOWN STRL REUS W/TWL LRG LVL3 (GOWN DISPOSABLE) ×4
GOWN STRL REUS W/TWL XL LVL3 (GOWN DISPOSABLE) ×4
IMPL COCHLEAR NUCLEUS (Otologic Implant) IMPLANT
IMPLANT COCHLEAR NUCLEUS (Otologic Implant) ×2 IMPLANT
IV NS 500ML (IV SOLUTION) ×2
IV NS 500ML BAXH (IV SOLUTION) ×1 IMPLANT
IV NS IRRIG 3000ML ARTHROMATIC (IV SOLUTION) ×2 IMPLANT
IV SET EXT 30 76VOL 4 MALE LL (IV SETS) ×2 IMPLANT
KIT CVR 48X5XPRB PLUP LF (MISCELLANEOUS) IMPLANT
KIT PROCESSOR DUAL N7 (KITS) ×1 IMPLANT
MARKER SKIN DUAL TIP RULER LAB (MISCELLANEOUS) ×1 IMPLANT
NDL HYPO 25X1 1.5 SAFETY (NEEDLE) ×1 IMPLANT
NDL SAFETY ECLIPSE 18X1.5 (NEEDLE) ×1 IMPLANT
NDL SPNL 25GX3.5 QUINCKE BL (NEEDLE) ×1 IMPLANT
NEEDLE HYPO 18GX1.5 SHARP (NEEDLE) ×2
NEEDLE HYPO 22GX1.5 SAFETY (NEEDLE) ×2 IMPLANT
NEEDLE HYPO 25X1 1.5 SAFETY (NEEDLE) ×2 IMPLANT
NEEDLE SPNL 25GX3.5 QUINCKE BL (NEEDLE) ×2 IMPLANT
NS IRRIG 1000ML POUR BTL (IV SOLUTION) ×2 IMPLANT
PACK BASIN DAY SURGERY FS (CUSTOM PROCEDURE TRAY) ×2 IMPLANT
PACK ENT DAY SURGERY (CUSTOM PROCEDURE TRAY) ×2 IMPLANT
PATTIES SURGICAL .25X.25 (GAUZE/BANDAGES/DRESSINGS) ×2 IMPLANT
PENCIL BUTTON HOLSTER BLD 10FT (ELECTRODE) ×2 IMPLANT
PIN SAFETY STERILE (MISCELLANEOUS) IMPLANT
PROBE NERVBE PRASS .33 (MISCELLANEOUS) ×2 IMPLANT
SET IRRIG Y TYPE TUR BLADDER L (SET/KITS/TRAYS/PACK) ×2 IMPLANT
SLEEVE SCD COMPRESS KNEE MED (MISCELLANEOUS) ×2 IMPLANT
SPONGE GAUZE 4X4 12PLY STER LF (GAUZE/BANDAGES/DRESSINGS) IMPLANT
SPONGE NEURO XRAY DETECT 1X3 (DISPOSABLE) ×2 IMPLANT
SPONGE SURGIFOAM ABS GEL 100 (HEMOSTASIS) ×3 IMPLANT
SPONGE SURGIFOAM ABS GEL 12-7 (HEMOSTASIS) ×2 IMPLANT
STAPLER VISISTAT (STAPLE) IMPLANT
STRIP CLOSURE SKIN 1/2X4 (GAUZE/BANDAGES/DRESSINGS) ×2 IMPLANT
SUT BONE WAX W31G (SUTURE) ×2 IMPLANT
SUT CHROMIC 3 0 PS 2 (SUTURE) ×3 IMPLANT
SUT CHROMIC 3 0 SH 27 (SUTURE) ×1 IMPLANT
SUT ETHILON 5 0 PS 2 18 (SUTURE) IMPLANT
SUT NOVAFIL 5 0 BLK 18 IN P13 (SUTURE) ×1 IMPLANT
SUT SILK 3 0 PS 1 (SUTURE) IMPLANT
SUT SILK 3 0 SH 30 (SUTURE) ×1 IMPLANT
SUT SILK 4 0 P 3 (SUTURE) ×2 IMPLANT
SYR BULB 3OZ (MISCELLANEOUS) ×2 IMPLANT
SYR TB 1ML LL NO SAFETY (SYRINGE) ×2 IMPLANT
TOWEL OR 17X24 6PK STRL BLUE (TOWEL DISPOSABLE) ×4 IMPLANT
TOWEL OR NON WOVEN STRL DISP B (DISPOSABLE) ×2 IMPLANT
TRAY DSU PREP LF (CUSTOM PROCEDURE TRAY) ×2 IMPLANT
TRAY FOLEY BAG SILVER LF 14FR (SET/KITS/TRAYS/PACK) IMPLANT
TRAY FOLEY BAG SILVER LF 16FR (SET/KITS/TRAYS/PACK) ×1 IMPLANT
TUBING IRRIGATION (MISCELLANEOUS) ×2 IMPLANT

## 2016-12-27 NOTE — Transfer of Care (Signed)
Immediate Anesthesia Transfer of Care Note  Patient: Kimberly Haynes  Procedure(s) Performed: Procedure(s): COCHLEAR IMPLANT LEFT EAR (Left)  Patient Location: PACU  Anesthesia Type:General  Level of Consciousness: awake, sedated and confused  Airway & Oxygen Therapy: Patient Spontanous Breathing and Patient connected to face mask oxygen  Post-op Assessment: Report given to RN and Post -op Vital signs reviewed and stable  Post vital signs: Reviewed and stable  Last Vitals:  Vitals:   12/27/16 0746  BP: (!) 154/94  Pulse: 81  Resp: 18  Temp: 37.1 C    Last Pain:  Vitals:   12/27/16 0746  TempSrc: Oral         Complications: No apparent anesthesia complications

## 2016-12-27 NOTE — Anesthesia Preprocedure Evaluation (Addendum)
Anesthesia Evaluation  Patient identified by MRN, date of birth, ID band Patient awake    Reviewed: Allergy & Precautions, NPO status , Patient's Chart, lab work & pertinent test results  Airway Mallampati: I  TM Distance: >3 FB Neck ROM: Full    Dental  (+) Teeth Intact, Dental Advisory Given   Pulmonary sleep apnea ,    breath sounds clear to auscultation       Cardiovascular negative cardio ROS   Rhythm:Regular Rate:Normal     Neuro/Psych PSYCHIATRIC DISORDERS Depression Bipolar Disorder negative neurological ROS     GI/Hepatic negative GI ROS, Neg liver ROS,   Endo/Other  negative endocrine ROS  Renal/GU negative Renal ROS  negative genitourinary   Musculoskeletal negative musculoskeletal ROS (+)   Abdominal   Peds negative pediatric ROS (+)  Hematology negative hematology ROS (+)   Anesthesia Other Findings   Reproductive/Obstetrics negative OB ROS                            Anesthesia Physical Anesthesia Plan  ASA: II  Anesthesia Plan: General   Post-op Pain Management:    Induction: Intravenous  Airway Management Planned: Oral ETT  Additional Equipment:   Intra-op Plan:   Post-operative Plan: Extubation in OR  Informed Consent: I have reviewed the patients History and Physical, chart, labs and discussed the procedure including the risks, benefits and alternatives for the proposed anesthesia with the patient or authorized representative who has indicated his/her understanding and acceptance.   Dental advisory given  Plan Discussed with: CRNA  Anesthesia Plan Comments:         Anesthesia Quick Evaluation

## 2016-12-27 NOTE — OR Nursing (Signed)
Updated family of surgery start per MD request. Spoke with husband, Hiram Comber.

## 2016-12-27 NOTE — Brief Op Note (Signed)
12/27/2016  12:42 PM  PATIENT:  Kimberly Haynes  56 y.o. 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 PROCEDURE:  Procedure(s): COCHLEAR IMPLANT LEFT EAR (Left) - Cochlear [place] Nucleus Profile [address] [place], [place] #[id number]  SURGEON:  Surgeon(s) and Role:    * Vicie Mutters, MD - Primary  PHYSICIAN ASSISTANT:   ASSISTANTS: none   ANESTHESIA:   general  EBL:  Total I/O In: 2000 [I.V.:2000] Out: 125 [Urine:125]  BLOOD ADMINISTERED:none  DRAINS: none   LOCAL MEDICATIONS USED:  XYLOCAINE 7 ml  SPECIMEN:  No Specimen  DISPOSITION OF SPECIMEN:  N/A  COUNTS:  YES  TOURNIQUET:  * No tourniquets in log *  DICTATION: .Other Dictation: Dictation Number [id number]  PLAN OF CARE: Other Dictation: Dictation Number [id number]  PATIENT DISPOSITION:  PACU - hemodynamically stable.   Delay start of Pharmacological VTE agent (>24hrs) due to surgical blood loss or risk of bleeding: not applicable

## 2016-12-27 NOTE — Anesthesia Postprocedure Evaluation (Signed)
Anesthesia Post Note  Patient: Kimberly Haynes  Procedure(s) Performed: Procedure(s) (LRB): COCHLEAR IMPLANT LEFT EAR (Left)  Patient location during evaluation: PACU Anesthesia Type: General Level of consciousness: sedated and patient cooperative Pain management: pain level controlled Vital Signs Assessment: post-procedure vital signs reviewed and stable Respiratory status: spontaneous breathing Cardiovascular status: stable Anesthetic complications: no       Last Vitals:  Vitals:   12/27/16 1428 12/27/16 1515  BP:    Pulse: 91 94  Resp: 18   Temp:      Last Pain:  Vitals:   12/27/16 1633  TempSrc:   PainSc: [place]

## 2016-12-27 NOTE — Progress Notes (Signed)
S: Transferred to [facility] in stable condition. Voided without dysuria. Nausea earlier that has cleared. Little pain reported. Husband in room.  O: BP still elevated. Did not receive lobetolol in PACU. Other VS stable. No bleeding. Facial function intact. No nystagmus. Voided. IV stable.  A: 1. BP still elevated. Will be seeing her family physician for BP control. 2. Stable post op course without vertigo or nausea tonight. 3. Findings reviewed with patient and husband.  P: 1. Continue IVF, antibiotics, anti-emetics, steroids. 2. Plan DC in morning with husband if stable overnight.

## 2016-12-27 NOTE — Anesthesia Procedure Notes (Signed)
Procedure Name: Intubation Date/Time: 12/27/2016 7:38 AM Performed by: Lyndee Leo Pre-anesthesia Checklist: Patient identified, Emergency Drugs available, Suction available and Patient being monitored Patient Re-evaluated:Patient Re-evaluated prior to inductionOxygen Delivery Method: Circle system utilized Preoxygenation: Pre-oxygenation with 100% oxygen Intubation Type: IV induction Ventilation: Mask ventilation without difficulty Laryngoscope Size: Miller and 2 Grade View: Grade I Tube type: Oral Rae Tube size: 7.0 mm Number of attempts: 1 Airway Equipment and Method: Stylet and Oral airway Placement Confirmation: ETT inserted through vocal cords under direct vision,  positive ETCO2 and breath sounds checked- equal and bilateral Secured at: 20 cm Tube secured with: Tape Dental Injury: Teeth and Oropharynx as per pre-operative assessment

## 2016-12-27 NOTE — Interval H&P Note (Signed)
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  The various methods of treatment have been discussed with the patient and family. After consideration of risks, benefits and other options for treatment, the patient has consented to  Procedure(s): COCHLEAR IMPLANT LEFT EAR (Left) - Cochlear [place] Nucleus Profile [phone] as a surgical intervention .  The patient's history has been reviewed, patient examined, no change in status, stable for surgery.  I have reviewed the patient's chart and labs.  Questions were answered to the patient's satisfaction.  Informed consent has been read, signed and witnessed. Preoperative teaching and counseling have been provided. The patient does not report any fever, cough, or upper/lower respiratory infection during the last 3 days.   Kimberly Haynes, Kimberly Haynes M

## 2016-12-28 ENCOUNTER — Encounter (HOSPITAL_BASED_OUTPATIENT_CLINIC_OR_DEPARTMENT_OTHER): Payer: Self-pay | Admitting: Otolaryngology

## 2016-12-28 DIAGNOSIS — H903 Sensorineural hearing loss, bilateral: Secondary | ICD-10-CM | POA: Diagnosis not present

## 2016-12-28 MED FILL — diazePAM 5 MG TABS: 5 | 3 days supply | Qty: 10 | Fill #0

## 2016-12-28 MED FILL — CEFPROZIL 500 MG TABLET: 500 | 10 days supply | Qty: 20 | Fill #0

## 2016-12-28 MED FILL — HYDROCODON-APAP 5-325: 5-325 | 5 days supply | Qty: 20 | Fill #0

## 2016-12-28 NOTE — Op Note (Signed)
NAMESANNE, BRAZELTON NO.:  [id number]  MEDICAL RECORD NO.:  [id number]  LOCATION:                                 FACILITY:  PHYSICIAN:  Fannie Knee, M.D.    DATE OF BIRTH:  [birth year]  DATE OF PROCEDURE:  12/27/2016 DATE OF DISCHARGE:  12/28/2016                              OPERATIVE REPORT   JUSTIFICATION FOR PROCEDURE:  Kimberly Haynes is a 56 year old white female, who is here today for a left Cochlear Americas Nucleus Profile [phone] cochlear implant.  This is a cochlear implant with a Slim Modiolar electrode.  The patient has developed familial nonsyndromic sensorineural hearing loss and has severe sensorineural hearing loss in the right ear with an SRT of 80 dB and 32% discrimination at 105 dB HTL and very severe sensorineural hearing loss in the left ear with an SRT of 100 dB and 12% discrimination at 105 dB HTL.  She has been wearing binaural hearing aids for many years.  Her hearing has finally deteriorated to the point that she is beyond hearing aids, and she was recommended for a cochlear implant.  Preoperative evaluation included a CT scan of her temporal bones which documented patent cochleae bilaterally and CNC and AZbio testing.  She underwent preoperative anesthesia clearance by Dr. Wynonia Lawman of [place], and she underwent extensive cochlear implant counseling by myself and my audiologists.  Her daughter, Kimberly Haynes, has undergone a successful cochlear implant and, Therefore, the patient was very familiar with the procedure.  Risks, complications, and alternatives of the procedure were explained to her and to her husband.  Questions were invited and answered. She read our cochlear implant informed consent and signed it, and it was witnessed. She was given a copy.  Questions were invited and answered, and preoperative teaching and counseling were provided.  JUSTIFICATION FOR OUTPATIENT SETTING:  The patient's age need for general endotracheal  anesthesia.  JUSTIFICATION FOR OVERNIGHT STAY:  23 hours of observation, status post left cochlear implant.  SURGEON:  Fannie Knee, M.D.  ANESTHESIA:  General endotracheal, Dr. Smith Robert.  CRNA:  Johny Shock.  COMPLICATIONS:  None.  SUMMARY OF REPORT:  After the patient was taken to operating room #[facility] at [facility], she was placed in the supine position.  She had an IV started in the holding area.  General IV induction was performed by Dr. Smith Robert, and the patient was orally intubated without difficulty by Johny Shock, CRNA.  She was properly positioned and monitored.  Elbows and ankles were padded with foam rubber, and I initiated a time-out at 8:41 a.m.  The patient was then turned 90 degrees clockwise.  Hair was clipped in the left postauricular area.  Her hair was taped, and stockinette cap was applied.  A left postauricular incision was then marked and turned into a lazy S cochlear implant incision.  The skin was then infiltrated with 6 mL of 2% xylocaine with 1:100,000 epinephrine.  Silver wire needle electrodes were then inserted into the left orbicularis oris and oculi muscles and suprasternal notch and connected to a NIM facial nerve monitor preamplifier.  The circulating nurse inserted a Foley catheter without difficulty.  The patient's left ear,  hemiface, and scalp were then prepped with Betadine and draped in the standard fashion for cochlear implant.  A scalp site was marked for the internal receiver using methylene blue dye.  I should mention that the site was chosen after a BTE template and cochlear implant dummy templates were used to mark the site prior to prepping.  A right postauricular incision was then made and carried down through skin and subcutaneous tissue.  A T-shaped incision was then made in the musculoperiosteal layer, and anterior and posterior subperiosteal flaps were elevated.  The mastoid cortex was exposed, and the posterior  canal wall was identified as was the spine of Henle.  The mastoid cortex was then removed with a cutting bur using a Stryker S2 drill system and continuous suction irrigation.  The mastoid was found to be fairly normally pneumatized.  The sigmoid sinus was identified. The antrum was opened and dissection was carried toward the attic.  The dome of the lateral semicircular canal was identified as was the body of the incus.  The facial recess was then opened with a #2 diamond bur. The facial nerve was identified prior to opening the facial recess using a facial nerve stimulator.  The facial recess was then fully opened.  The facial nerve was protected during this portion of the procedure.  The middle ear was entered without difficulty.  The incus and stapes were found to be in normal anatomic position.  The Haynes window niche overhang was then removed with a #1 mm diamond bur.  The Haynes window membrane itself was very thickened and fibrotic.  The Haynes window, itself, was rather constricted. Therefore, an expanded Haynes window approach was used.  I should mention that prior to prepping and draping the patient, the middle ear had been filled with 0.5 mL of dexamethasone 10 mg/mL using a 25-gauge needle and transtympanic injection.  Now, a Gel-Foam disk was soaked in the same dexamethasone solution and was placed against the Haynes window membrane.  Attention was then turned to the skull.  The site that had been previously marked for the internal receiver was located and a rectangle of bone was drilled in the outer table using a Nucleus Profile template. A trough was then drilled from the anterior portion of the rectangular area to the posterior superior mastoid cavity.  A Freer elevator was used to create a pocket for the posterior portion of the internal Receiver posteriorly.  The site was then copiously irrigated with bacitracin containing Saline, and gloves were changed.  A Cochlear  Americas Nucleus Profile cochlear implant model [phone] with a Slim Modiolar electrode, serial [id number] was then opened under saline and inspected.  It was placed on the back table.  The Sizer tool was also removed from the packaging.  The cochlear implant was then taken and placed in the pocket in the subcutaneous tissue.  Great care was taken to make sure the skin flap was at least 6 mm thick lateral to the ICS.  The anterior portion of the ICS was then placed in the rectangular depression that was created in the skull, and the implant was sutured down using 3 interrupted 3-0 silk sutures.  The electrode array was placed in the trough.  Attention was then turned to the middle ear. Dexamathasone Gelfoam was removed from the Haynes window membrane.  A 5910 Beaver blade was used to make an incision in the Haynes window membrane starting superiorly and then working around posteriorly and then anteriorly.  The membrane  was then peeled down due to the constricted nature of the opening.  A 1 mm Diamond bur was then used to perform an extended Haynes window approach by removing bone a slightly anterior and inferior. There was also a ledge of bone in the cochlea that was gently removed in order to provide smooth insertion of the electrode sheath. the Sizer total was then inserted into the opening to make sure that the sheath would fit into the cochlea.  The cochlear implant electrode array was then secured with the AOS Forceps, and the sheath fin was secured with Jeweler's forceps.  The sheath was then slowly drawn over the electrode array to uncoil it until the sheath reached a hard stop. The sheath was then inserted through the extended Haynes window opening to its stop.  Then, using the AOS forceps, the implant electrode array was gently advanced over 1 minute 40 seconds.  The sheath was then carefully withdrawn using the Jeweler's forceps.  One marker was seen in the cochleostomy.   Initially, only the distal white band was visualized, and therefore, the electrode array was slightly backed out until all 3 white bands were seen at the cochleostomy.  Small pieces of fascia were then used to pack around the electrode array in the Haynes window niche for a connective tissue seal.  Four pieces of fascia were then placed around the electrode array, and a large piece of muscle was used to occlude the facial recess.  The facial recess was packed with two disks of gelfoam. The electrode array was then coiled into the inferior portion of the mastoid cavity and stabilized with 3 large rectangles of Gelfoam soaked in Cipro HC.  The ground electrode was then placed medial to the temporalis muscle. The musculoperiosteal layer was then closed with interrupted 3-0 chromic and tacking sutures were placed in the skin.  An intraoperative C-arm x-ray was then taken and demonstrated that the electrode array was well within the cochlea with its characteristic Coiling, and there was no folding of the electrode array.  Romie Levee of [employer] then gave our circulating nurse her NRT Remote, and our circulating nurse was able to perform an NRT test of the electrode array.  All electrodes were found to be functioning normally. It was a very positive test.  The remainder of the incision was then closed with interrupted inverted 3-0 chromics, and skin was closed with skin staples.  Several 5-0 Novafil sutures were placed between the staples to line up the incision properly.  Bacitracin ointment was applied.  The patient's Foley catheter was removed.  Her left ear was then padded with Telfa and cotton and a standard adult Glasscock mastoid dressing was applied loosely in the standard fashion.  All NIM electrodes were removed.  The patient's right hearing aid was then placed in her right ear.  She was then awakened, extubated, and transferred to her hospital bed.  She appeared to  tolerate the general endotracheal anesthesia and the procedure well, and left the operating room in stable condition.  TOTAL FLUIDS:  2000 mL.  TOTAL BLOOD LOSS:  Less than 20 mL.  TOTAL URINE OUTPUT:  125 mL.  Sponge, needle, and cotton ball counts were correct at the termination of the procedure.  No specimens were sent to pathology.  She received intraoperatively Ancef 2 g IV, Zofran 4 mg IV at the beginning and end of the procedure, and Decadron 10 mg IV.  Ms. Lafont will be admitted to the PACU,  then will be transferred to the 23-hour overnight RCC stay area for 23 hours of observation.  If stable overnight, she will be discharged on December 28, 2016, with her husband and she will be instructed to return to my office on January 04, 2017, at 3:45 p.m.  Discharge medications will include Cefzil 500 mg p.o. b.i.d. x10 days with food, Vicodin 5 300 #21 p.o. q.4 hours p.r.n. pain, bacitracin ointment to apply to the incision b.i.d. x1 week, and Valium 5 mg, #10 tabs one p.o. q.8 hours p.r.n. vertigo.  She is to keep her head elevated on 2 pillows for the next week, avoid heavy lifting or Straining, and avoid laying directly over the implant for the next week. She and her husband are to call [phone] for any postoperative problems directly related to the procedure.  She will be given both verbal and written instructions.     Fannie Knee, M.D.   ______________________________ Fannie Knee, M.D.    EMK/MEDQ  D:  12/27/2016  T:  12/28/2016  Job:  [id number]

## 2016-12-28 NOTE — Discharge Summary (Signed)
Physician Discharge Summary  Patient ID: Kimberly Haynes MRN: [id number] DOB/AGE: 03-21-1961 56 y.o.  Admit date: 12/27/2016 Discharge date: 12/28/2016  Admission Diagnoses: Severe bilateral sensorineural hearing loss beyond hearing aids  Discharge Diagnoses: Severe bilateral sensorineural hearing loss beyond hearing aids Active Problems:   Post-op pain   Complications None  Discharged Condition: stable  Hospital Course: Admitted to preop on 12-27-16. Taken to [facility] #6 and underwent uncomplicated left Cochlear [place] Nucleus Profile [phone] w slim modiolar electrode, full length insertion documented on intraoperative Carm xray via extended round window approach. Intraoperative NRT testing documented that all electrodes were functioning normally. No complications. Transferred to PACU in stable condition. Transferred to [facility] Room #2. BP slightly elevated but did not require medication. Quiet postoperative course. DC'ed in stable condition with husband on morning of 12-28-16. Facial function intact. No nystagmus, nausea or vomiting. DC status stable.  Consults: None  Significant Diagnostic Studies: Intraoperative C-arm xray, intraoperative NRT testing  Treatments: surgery: Left Nucleus Profile [phone] cochlear implant  Discharge Exam: Blood pressure 136/88, pulse 75, temperature 97.6 F (36.4 C), resp. rate 16, height 5\' 1"  (1.549 m), weight 73.5 kg (162 lb), SpO2 96 %. VIIth, n intact, no nystagmus, left postauricular incision stable  Disposition: 01-Home or Self Care - DC status stable. DC to home with husband  Diet Regular  Room Locations  [facility] preop, OR #6, PACU, RCC #2.  Activity Quiet indoor activity x 1 wk  1. DC this morning with husband 2. Return to office for follow up and staple removal in one week. 3. Regular diet 4. Sleep with HOB elevated on two pillows 5. DC meds:  A. Cefzil 500 mg PO BID x 10 days.  B. Vicodin 5/300 1 PO Q4-6 hrs prn pain (#20)  C. Mupirocin or  bacitracin ointment  BId x 1 wk.  D. Alternate Tylenol with Ibuprofen Q4-6 hrs. Prn pain 6. Water precautions x 3 wks. 7. DC status = stable      Signed: Norvin Ohlin M 12/28/2016, 8:16 AM

## 2016-12-28 NOTE — Progress Notes (Signed)
S: Quiet night with little pain and no vertigo, voiding, no major complaints this morning  O: Awake and alert, VS stable, BP decreased, VIIth n function intact, no nystagmus      Dressing changed. Incision stable and without hematoma, has some ecchymosis of posterior auricle, staples stable, ointment applied  A: 1. Stable post operative course 2. BP improved 3. Ok for DC this morning with husband  P: 1. DC this morning with husband 2. Return to office for follow up and staple removal in one week. 3. Regular diet 4. Sleep with HOB elevated on two pillows 5. DC meds:  A. Cefzil 500 mg PO BID x 10 days.  B. Vicodin 5/300 1 PO Q4-6 hrs prn pain (#20)  C. Mupirocin or bacitracin ointment  BId x 1 wk.  D. Alternate Tylenol with Ibuprofen Q4-6 hrs. Prn pain 6. Water precautions x 3 wks. 7. DC status = stable

## 2017-01-19 DIAGNOSIS — Z23 Encounter for immunization: Secondary | ICD-10-CM | POA: Diagnosis not present

## 2017-02-01 DIAGNOSIS — H903 Sensorineural hearing loss, bilateral: Secondary | ICD-10-CM | POA: Diagnosis not present

## 2017-02-08 DIAGNOSIS — H903 Sensorineural hearing loss, bilateral: Secondary | ICD-10-CM | POA: Diagnosis not present

## 2017-02-27 DIAGNOSIS — H903 Sensorineural hearing loss, bilateral: Secondary | ICD-10-CM | POA: Diagnosis not present

## 2017-03-02 MED FILL — lamoTRIgine 100 MG TABS: 100 | 90 days supply | Qty: 90 | Fill #1

## 2017-03-05 DIAGNOSIS — Z6830 Body mass index (BMI) 30.0-30.9, adult: Secondary | ICD-10-CM | POA: Diagnosis not present

## 2017-03-05 DIAGNOSIS — G4700 Insomnia, unspecified: Secondary | ICD-10-CM | POA: Diagnosis not present

## 2017-03-05 DIAGNOSIS — F419 Anxiety disorder, unspecified: Secondary | ICD-10-CM | POA: Diagnosis not present

## 2017-03-05 DIAGNOSIS — Z1211 Encounter for screening for malignant neoplasm of colon: Secondary | ICD-10-CM | POA: Diagnosis not present

## 2017-03-05 DIAGNOSIS — F900 Attention-deficit hyperactivity disorder, predominantly inattentive type: Secondary | ICD-10-CM | POA: Diagnosis not present

## 2017-03-05 DIAGNOSIS — F3181 Bipolar II disorder: Secondary | ICD-10-CM | POA: Diagnosis not present

## 2017-03-05 DIAGNOSIS — R030 Elevated blood-pressure reading, without diagnosis of hypertension: Secondary | ICD-10-CM | POA: Diagnosis not present

## 2017-03-05 DIAGNOSIS — R0681 Apnea, not elsewhere classified: Secondary | ICD-10-CM | POA: Diagnosis not present

## 2017-03-05 MED FILL — VYVANSE 40 MG CAPSULE: 40 | 90 days supply | Qty: 90 | Fill #0

## 2017-03-21 MED FILL — ZIPRASIDONE HCL 20 MG CAP: 20 | 90 days supply | Qty: 180 | Fill #0

## 2017-03-21 MED FILL — ZIPRASIDONE HCL 80 MG CAP: 80 | 90 days supply | Qty: 90 | Fill #0

## 2017-03-21 MED FILL — BUPROPION HCL XL 300 MG TAB: 300 | 90 days supply | Qty: 90 | Fill #0

## 2017-03-22 DIAGNOSIS — H903 Sensorineural hearing loss, bilateral: Secondary | ICD-10-CM | POA: Diagnosis not present

## 2017-04-03 MED FILL — ZALEPLON 10 MG CAPSULE: 10 | 90 days supply | Qty: 180 | Fill #0

## 2017-04-12 ENCOUNTER — Institutional Professional Consult (permissible substitution): Payer: Self-pay | Admitting: Neurology

## 2017-05-18 DIAGNOSIS — H903 Sensorineural hearing loss, bilateral: Secondary | ICD-10-CM | POA: Diagnosis not present

## 2017-05-22 MED FILL — buPROPion HCL ER (XL) 300 M: 300 | 30 days supply | Qty: 30 | Fill #1

## 2017-05-22 MED FILL — lamoTRIgine 100 MG TABS: 100 | 90 days supply | Qty: 90 | Fill #0

## 2017-06-13 MED FILL — VYVANSE 40 MG CAPSULE: 40 | 90 days supply | Qty: 90 | Fill #0

## 2017-06-15 MED FILL — buPROPion HCL ER (XL) 300 M: 300 | 30 days supply | Qty: 30 | Fill #2

## 2017-06-22 MED FILL — ZIPRASIDONE HCL 20 MG CAP: 20 | 90 days supply | Qty: 180 | Fill #1

## 2017-06-22 MED FILL — ZIPRASIDONE HCL 80 MG CAP: 80 | 90 days supply | Qty: 90 | Fill #1

## 2017-07-05 MED FILL — ZALEPLON 10 MG CAPSULE: 10 | 90 days supply | Qty: 180 | Fill #1

## 2017-07-26 DIAGNOSIS — M19071 Primary osteoarthritis, right ankle and foot: Secondary | ICD-10-CM | POA: Diagnosis not present

## 2017-07-26 DIAGNOSIS — M79671 Pain in right foot: Secondary | ICD-10-CM | POA: Diagnosis not present

## 2017-07-26 DIAGNOSIS — M7731 Calcaneal spur, right foot: Secondary | ICD-10-CM | POA: Diagnosis not present

## 2017-07-26 DIAGNOSIS — Z9889 Other specified postprocedural states: Secondary | ICD-10-CM | POA: Diagnosis not present

## 2017-07-26 DIAGNOSIS — Z882 Allergy status to sulfonamides status: Secondary | ICD-10-CM | POA: Diagnosis not present

## 2017-07-26 DIAGNOSIS — Z967 Presence of other bone and tendon implants: Secondary | ICD-10-CM | POA: Diagnosis not present

## 2017-07-26 DIAGNOSIS — M84374A Stress fracture, right foot, initial encounter for fracture: Secondary | ICD-10-CM | POA: Diagnosis not present

## 2017-08-07 MED FILL — BUPROPION HCL XL 300 MG TAB: 300 | 30 days supply | Qty: 30 | Fill #3

## 2017-08-08 DIAGNOSIS — H903 Sensorineural hearing loss, bilateral: Secondary | ICD-10-CM | POA: Diagnosis not present

## 2017-09-03 DIAGNOSIS — H5213 Myopia, bilateral: Secondary | ICD-10-CM | POA: Diagnosis not present

## 2017-09-03 DIAGNOSIS — H52223 Regular astigmatism, bilateral: Secondary | ICD-10-CM | POA: Diagnosis not present

## 2017-09-05 DIAGNOSIS — F900 Attention-deficit hyperactivity disorder, predominantly inattentive type: Secondary | ICD-10-CM | POA: Diagnosis not present

## 2017-09-05 DIAGNOSIS — R0681 Apnea, not elsewhere classified: Secondary | ICD-10-CM | POA: Diagnosis not present

## 2017-09-05 DIAGNOSIS — Z23 Encounter for immunization: Secondary | ICD-10-CM | POA: Diagnosis not present

## 2017-09-05 DIAGNOSIS — F3181 Bipolar II disorder: Secondary | ICD-10-CM | POA: Diagnosis not present

## 2017-09-05 DIAGNOSIS — R030 Elevated blood-pressure reading, without diagnosis of hypertension: Secondary | ICD-10-CM | POA: Diagnosis not present

## 2017-09-05 DIAGNOSIS — Z6832 Body mass index (BMI) 32.0-32.9, adult: Secondary | ICD-10-CM | POA: Diagnosis not present

## 2017-09-05 DIAGNOSIS — F419 Anxiety disorder, unspecified: Secondary | ICD-10-CM | POA: Diagnosis not present

## 2017-09-05 DIAGNOSIS — Z6830 Body mass index (BMI) 30.0-30.9, adult: Secondary | ICD-10-CM | POA: Diagnosis not present

## 2017-09-05 DIAGNOSIS — G4700 Insomnia, unspecified: Secondary | ICD-10-CM | POA: Diagnosis not present

## 2017-09-05 MED FILL — VYVANSE 40 MG CAPSULE: 40 | 90 days supply | Qty: 90 | Fill #0

## 2017-09-05 MED FILL — OXcarbazepine 150 MG TABS: 150 | 30 days supply | Qty: 120 | Fill #0

## 2017-09-05 MED FILL — lamoTRIgine 100 MG TABS: 100 | 90 days supply | Qty: 90 | Fill #0

## 2017-09-05 MED FILL — BUPROPION HCL XL 300 MG TAB: 300 | 90 days supply | Qty: 90 | Fill #0

## 2017-09-10 DIAGNOSIS — M84374D Stress fracture, right foot, subsequent encounter for fracture with routine healing: Secondary | ICD-10-CM | POA: Diagnosis not present

## 2017-09-11 MED FILL — ZIPRASIDONE HCL 20 MG CAP: 20 | 30 days supply | Qty: 150 | Fill #0

## 2017-10-04 DIAGNOSIS — F3181 Bipolar II disorder: Secondary | ICD-10-CM | POA: Diagnosis not present

## 2017-10-04 DIAGNOSIS — E2839 Other primary ovarian failure: Secondary | ICD-10-CM | POA: Diagnosis not present

## 2017-10-04 DIAGNOSIS — Z0000 Encounter for general adult medical examination without abnormal findings: Secondary | ICD-10-CM | POA: Diagnosis not present

## 2017-10-04 DIAGNOSIS — Z6835 Body mass index (BMI) 35.0-35.9, adult: Secondary | ICD-10-CM | POA: Diagnosis not present

## 2017-10-04 DIAGNOSIS — F900 Attention-deficit hyperactivity disorder, predominantly inattentive type: Secondary | ICD-10-CM | POA: Diagnosis not present

## 2017-10-04 DIAGNOSIS — E7800 Pure hypercholesterolemia, unspecified: Secondary | ICD-10-CM | POA: Diagnosis not present

## 2017-10-04 DIAGNOSIS — Z1211 Encounter for screening for malignant neoplasm of colon: Secondary | ICD-10-CM | POA: Diagnosis not present

## 2017-10-04 MED FILL — ZALEPLON 10 MG CAPSULE: 10 | 90 days supply | Qty: 180 | Fill #0

## 2017-10-19 MED FILL — OXcarbazepine 150 MG TABS: 150 | 30 days supply | Qty: 120 | Fill #1

## 2017-10-19 MED FILL — ZIPRASIDONE HCL 20 MG CAP: 20 | 36 days supply | Qty: 180 | Fill #1

## 2017-10-31 DIAGNOSIS — G4733 Obstructive sleep apnea (adult) (pediatric): Secondary | ICD-10-CM | POA: Diagnosis not present

## 2017-11-29 DIAGNOSIS — G4733 Obstructive sleep apnea (adult) (pediatric): Secondary | ICD-10-CM | POA: Diagnosis not present

## 2017-11-29 DIAGNOSIS — F900 Attention-deficit hyperactivity disorder, predominantly inattentive type: Secondary | ICD-10-CM | POA: Diagnosis not present

## 2017-11-29 DIAGNOSIS — G4700 Insomnia, unspecified: Secondary | ICD-10-CM | POA: Diagnosis not present

## 2017-11-29 DIAGNOSIS — F3181 Bipolar II disorder: Secondary | ICD-10-CM | POA: Diagnosis not present

## 2017-11-29 DIAGNOSIS — Z6833 Body mass index (BMI) 33.0-33.9, adult: Secondary | ICD-10-CM | POA: Diagnosis not present

## 2017-12-05 MED FILL — OXcarbazepine 150 MG TABS: 150 | 30 days supply | Qty: 240 | Fill #0

## 2017-12-06 MED FILL — VYVANSE 40 MG CAPSULE: 40 | 90 days supply | Qty: 90 | Fill #0

## 2017-12-07 DIAGNOSIS — G4733 Obstructive sleep apnea (adult) (pediatric): Secondary | ICD-10-CM | POA: Diagnosis not present

## 2017-12-29 IMAGING — CT CT TEMPORAL BONES W/O CM
2 of 5 series · 12 of 40 positions shown, 15 images · non-contrast
Comparison: Head CT 08/04/2008

CLINICAL DATA: Bilateral hearing loss. Evaluation for possible
cochlear implant.

EXAM:
CT TEMPORAL BONES WITHOUT CONTRAST
TECHNIQUE: Axial and coronal plane CT imaging of the petrous temporal bones was
performed with thin-collimation image reconstruction. No intravenous
contrast was administered. Multiplanar CT image reconstructions were
also generated.

[Series 4: coronal bone. · coronal · 0.11mm/px · 2 of 210 slices shown]
[im 70/210  bone]
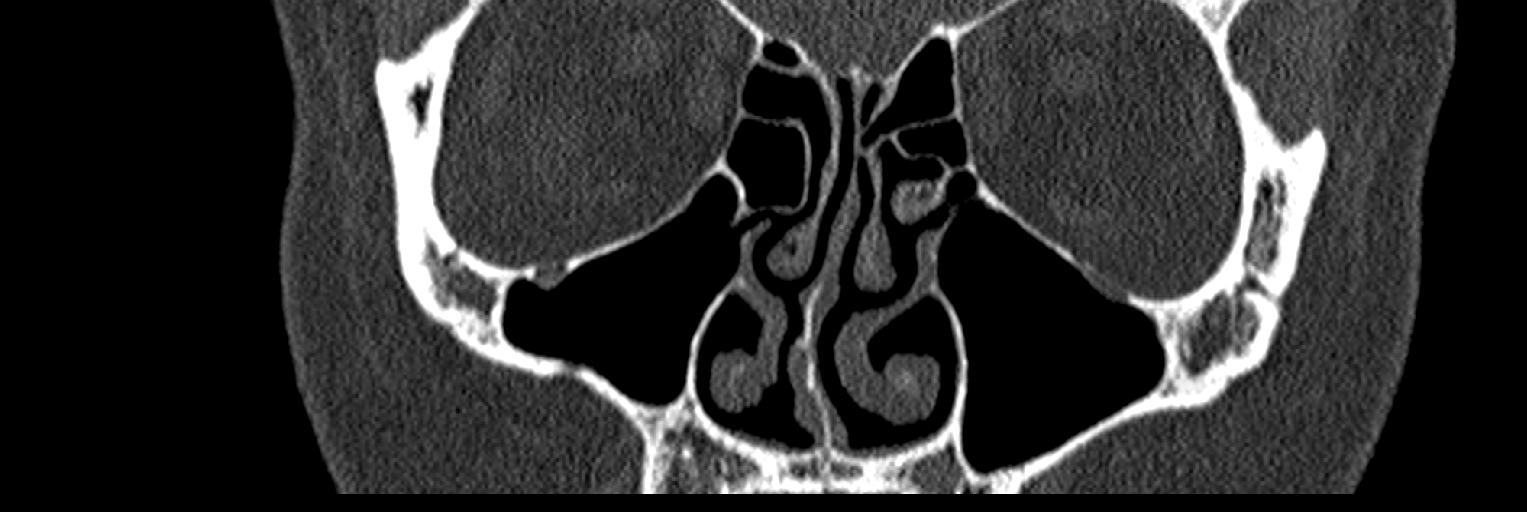
[im 140/210  bone]
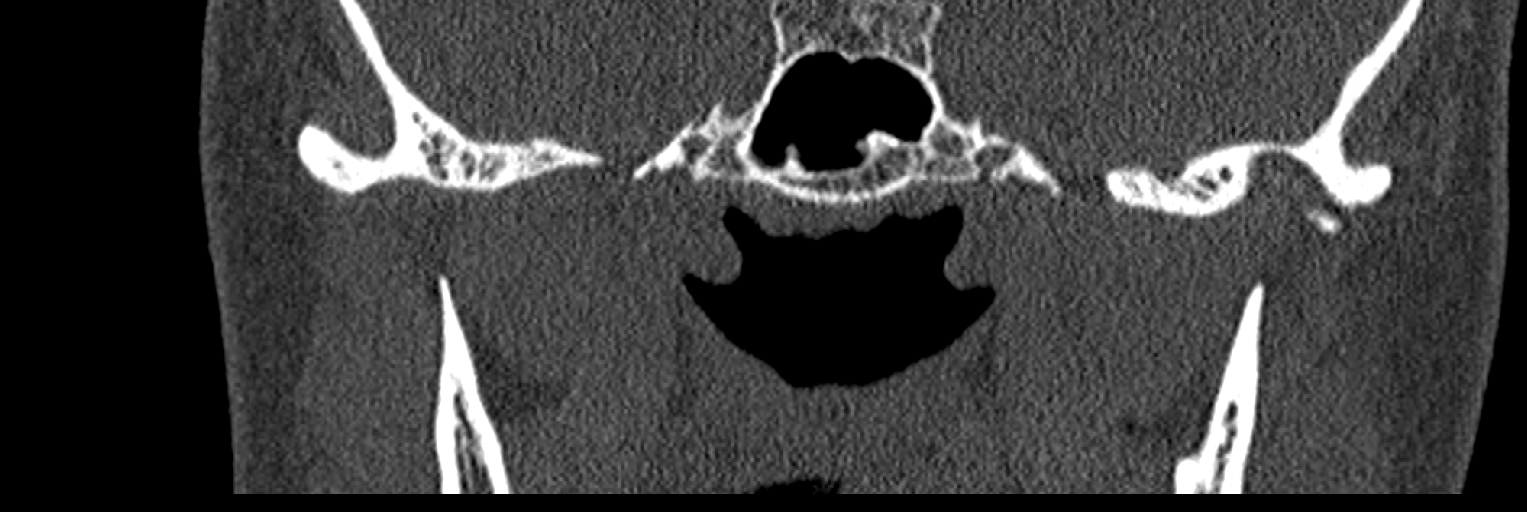

[Series 7: ax mag left · axial · 0.20mm/px · z∈[-86,-41]mm · 10 of 89 slices shown, 13 images]
[im 9/89  brain]
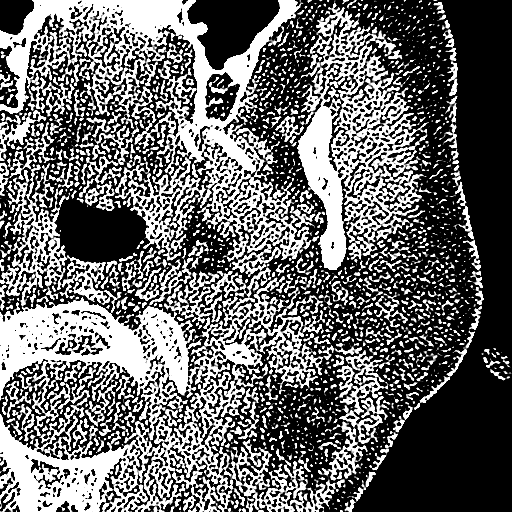
[im 9/89  bone]
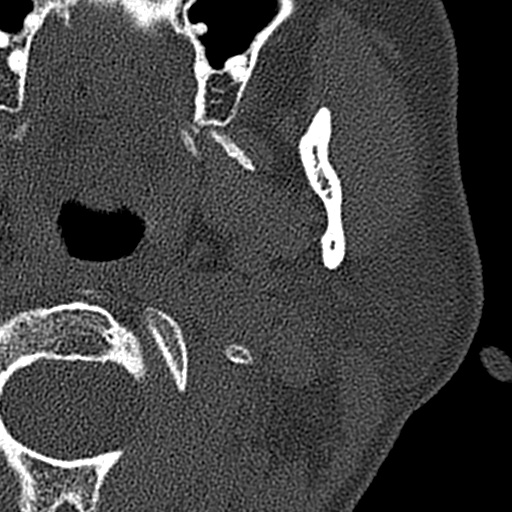
[im 17/89  bone]
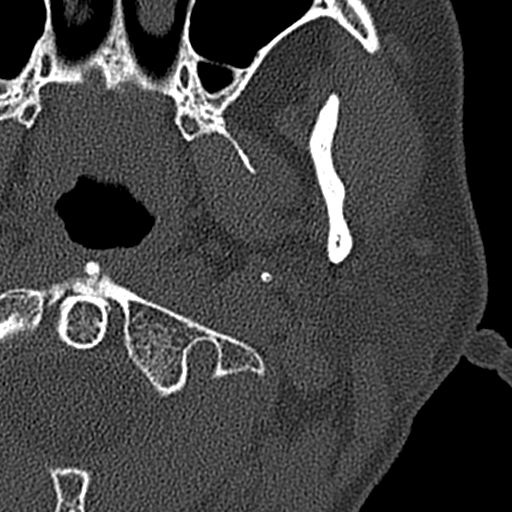
[im 26/89  bone]
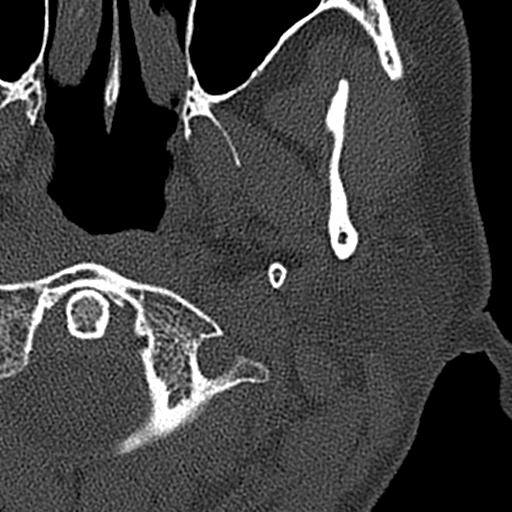
[im 34/89  bone]
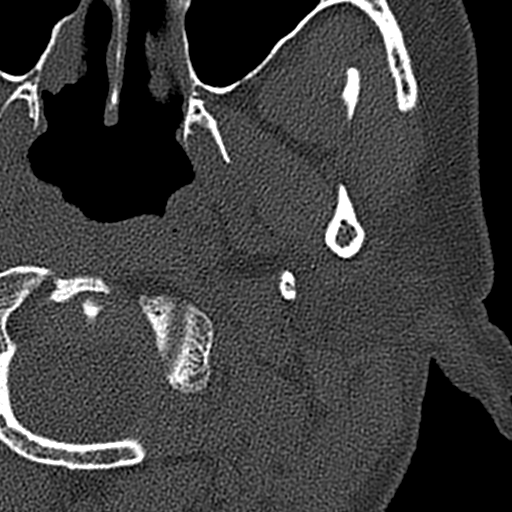
[im 42/89  brain]
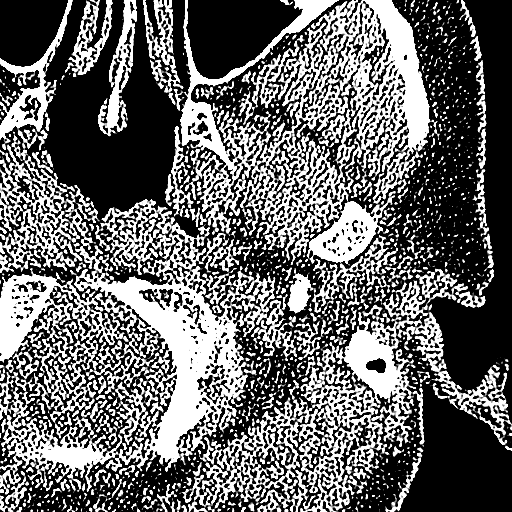
[im 42/89  bone]
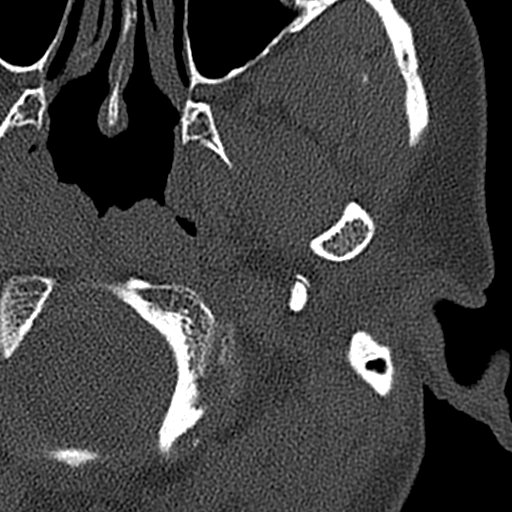
[im 51/89  bone]
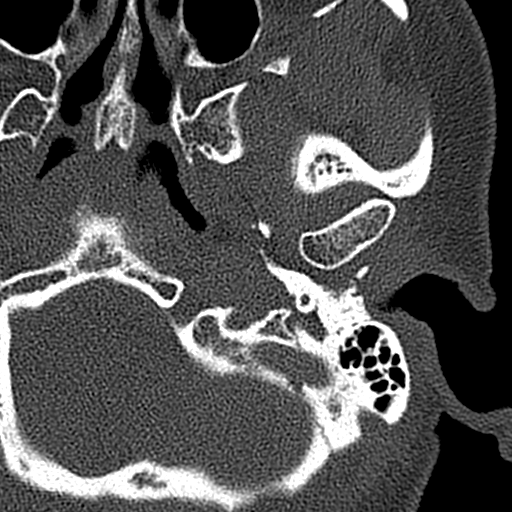
[im 59/89  bone]
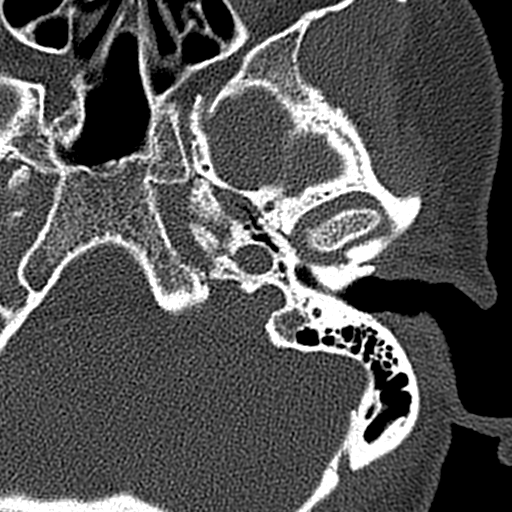
[im 68/89  bone]
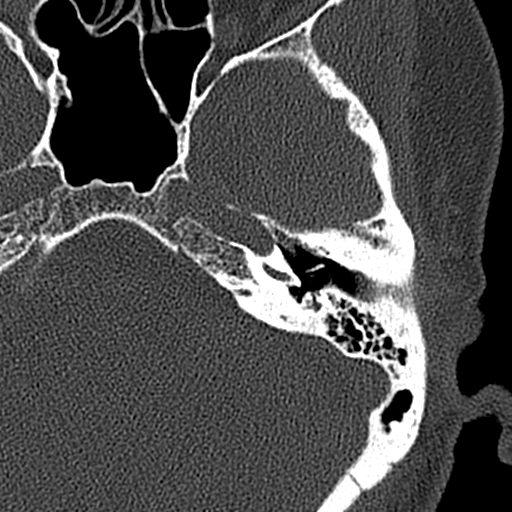
[im 76/89  brain]
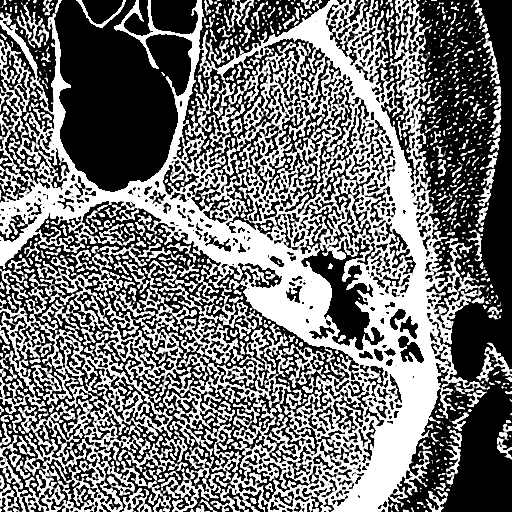
[im 76/89  bone]
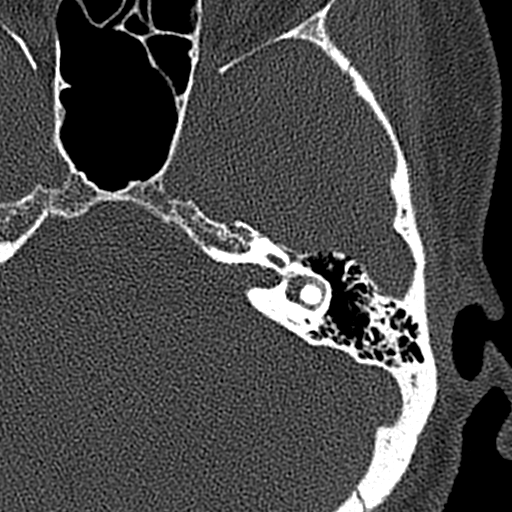
[im 84/89  bone]
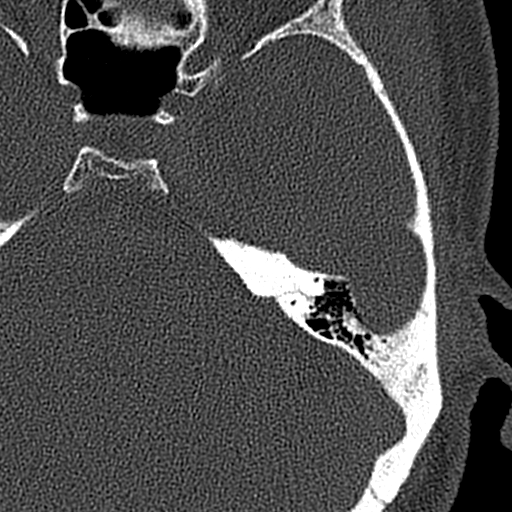

[12 of 40 positions shown; findings below may reference images not displayed]

FINDINGS: RIGHT: The external auditory canal and tympanic membrane are
unremarkable. The ossicles appear intact. The tympanic cavity is
clear. There is osseous thinning involving the superior margin of
the superior semicircular canal without definite complete
dehiscence. The internal auditory canal, cochlea, vestibule, and
semicircular canals are otherwise unremarkable. The jugular bulb is
not high riding, however the sigmoid plate is very thin. The mastoid
air cells are clear.

LEFT: The external auditory canal and tympanic membrane are
unremarkable. The ossicles appear intact. The tympanic cavity is
clear. There is osseous thinning versus dehiscence involving the
superior margin of the superior semicircular canal. The internal
auditory canal, cochlea, vestibule, and semicircular canals are
otherwise unremarkable. The mastoid air cells are clear.

The visualized paranasal sinuses are clear. The visualized portion
of the brain is unremarkable. Visualized orbits are unremarkable.
IMPRESSION: 1. Marked thinning of the sigmoid plate covering the right jugular
bulb.
2. Osseous thinning versus dehiscence of the left superior
semicircular canal.
3. Otherwise unremarkable appearance of the temporal bones.

## 2018-01-04 DIAGNOSIS — G4733 Obstructive sleep apnea (adult) (pediatric): Secondary | ICD-10-CM | POA: Diagnosis not present

## 2018-01-08 MED FILL — BUPROPION HCL XL 300 MG TAB: 300 | 90 days supply | Qty: 90 | Fill #1

## 2018-01-08 MED FILL — OXcarbazepine 150 MG TABS: 150 | 30 days supply | Qty: 240 | Fill #1

## 2018-01-25 MED FILL — ZALEPLON 10 MG CAPSULE: 10 | 90 days supply | Qty: 180 | Fill #1

## 2018-01-29 MED FILL — OXcarbazepine 150 MG TABS: 150 | 30 days supply | Qty: 240 | Fill #2

## 2018-02-04 DIAGNOSIS — G4733 Obstructive sleep apnea (adult) (pediatric): Secondary | ICD-10-CM | POA: Diagnosis not present

## 2018-02-11 DIAGNOSIS — G4733 Obstructive sleep apnea (adult) (pediatric): Secondary | ICD-10-CM | POA: Diagnosis not present

## 2018-02-11 IMAGING — RF DG C-ARM 61-120 MIN
1 series · 1 of 1 positions shown · non-contrast
Comparison: CT 11/13/2016.

CLINICAL DATA: Cochlear implant.

EXAM:
SKULL - 1-3 VIEW; DG C-ARM 61-120 MIN

[Series 1: run · 1 of 1 slices shown]
[im 1/1]
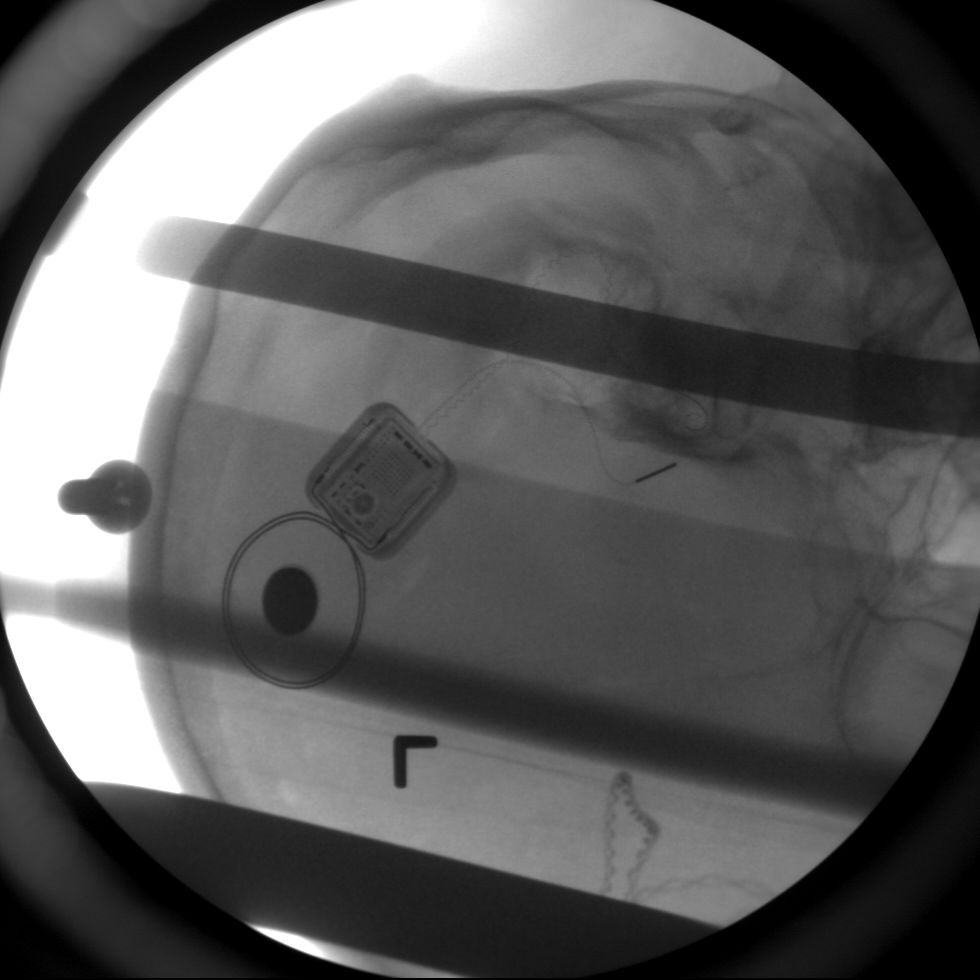

[1 of 1 positions shown; findings below may reference images not displayed]

FINDINGS: Cochlear implant noted. Single image obtained. 2 minutes 24 seconds
fluoroscopy time .
IMPRESSION: Cochlear implant noted..

## 2018-02-28 DIAGNOSIS — F3181 Bipolar II disorder: Secondary | ICD-10-CM | POA: Diagnosis not present

## 2018-02-28 DIAGNOSIS — F900 Attention-deficit hyperactivity disorder, predominantly inattentive type: Secondary | ICD-10-CM | POA: Diagnosis not present

## 2018-02-28 DIAGNOSIS — Z6833 Body mass index (BMI) 33.0-33.9, adult: Secondary | ICD-10-CM | POA: Diagnosis not present

## 2018-02-28 DIAGNOSIS — I10 Essential (primary) hypertension: Secondary | ICD-10-CM | POA: Diagnosis not present

## 2018-02-28 DIAGNOSIS — R740 Nonspecific elevation of levels of transaminase and lactic acid dehydrogenase [LDH]: Secondary | ICD-10-CM | POA: Diagnosis not present

## 2018-02-28 DIAGNOSIS — F419 Anxiety disorder, unspecified: Secondary | ICD-10-CM | POA: Diagnosis not present

## 2018-02-28 DIAGNOSIS — G4700 Insomnia, unspecified: Secondary | ICD-10-CM | POA: Diagnosis not present

## 2018-02-28 DIAGNOSIS — G4733 Obstructive sleep apnea (adult) (pediatric): Secondary | ICD-10-CM | POA: Diagnosis not present

## 2018-02-28 MED FILL — OXcarbazepine 300 MG TABS: 300 | 30 days supply | Qty: 210 | Fill #0

## 2018-02-28 MED FILL — AMLODIPINE BESYLATE 5 MG TA: 5 | 90 days supply | Qty: 90 | Fill #0

## 2018-02-28 MED FILL — lamoTRIgine 100 MG TABS: 100 | 90 days supply | Qty: 90 | Fill #0

## 2018-03-05 MED FILL — LITHIUM CARBONATE 600 MG CA: 600 | 90 days supply | Qty: 90 | Fill #0

## 2018-03-06 DIAGNOSIS — G4733 Obstructive sleep apnea (adult) (pediatric): Secondary | ICD-10-CM | POA: Diagnosis not present

## 2018-03-07 DIAGNOSIS — Z79899 Other long term (current) drug therapy: Secondary | ICD-10-CM | POA: Diagnosis not present

## 2018-03-07 DIAGNOSIS — R740 Nonspecific elevation of levels of transaminase and lactic acid dehydrogenase [LDH]: Secondary | ICD-10-CM | POA: Diagnosis not present

## 2018-03-08 MED FILL — VYVANSE 40 MG CAPSULE: 40 | 90 days supply | Qty: 90 | Fill #0

## 2018-03-12 ENCOUNTER — Ambulatory Visit: Payer: 59 | Admitting: Internal Medicine

## 2018-03-15 DIAGNOSIS — H903 Sensorineural hearing loss, bilateral: Secondary | ICD-10-CM | POA: Diagnosis not present

## 2018-04-06 DIAGNOSIS — G4733 Obstructive sleep apnea (adult) (pediatric): Secondary | ICD-10-CM | POA: Diagnosis not present

## 2018-04-11 MED FILL — buPROPion HCL ER (XL) 300 M: 300 | 90 days supply | Qty: 90 | Fill #0

## 2018-04-11 MED FILL — OXcarbazepine 300 MG TABS: 300 | 30 days supply | Qty: 210 | Fill #1

## 2018-04-11 MED FILL — ZALEPLON 10 MG CAPSULE: 10 | 75 days supply | Qty: 150 | Fill #0

## 2018-05-10 MED FILL — OXcarbazepine 300 MG TABS: 300 | 30 days supply | Qty: 220 | Fill #0

## 2018-05-24 MED FILL — LITHIUM CARBONATE 600 MG CA: 600 | 90 days supply | Qty: 90 | Fill #1

## 2018-05-24 MED FILL — AMLODIPINE BESYLATE 5 MG TA: 5 | 90 days supply | Qty: 90 | Fill #1

## 2018-05-27 MED FILL — lamoTRIgine 100 MG TABS: 100 | 90 days supply | Qty: 90 | Fill #1

## 2018-05-30 MED FILL — OXcarbazepine 300 MG TABS: 300 | 14 days supply | Qty: 98 | Fill #1

## 2018-05-30 MED FILL — VYVANSE 40 MG CAPSULE: 40 | 90 days supply | Qty: 90 | Fill #0

## 2018-06-18 MED FILL — OXcarbazepine 300 MG TABS: 300 | 30 days supply | Qty: 220 | Fill #2

## 2018-07-11 MED FILL — buPROPion HCL ER (XL) 300 M: 300 | 90 days supply | Qty: 90 | Fill #1

## 2018-07-24 MED FILL — OXcarbazepine 300 MG TABS: 300 | 30 days supply | Qty: 220 | Fill #3

## 2018-08-08 DIAGNOSIS — H903 Sensorineural hearing loss, bilateral: Secondary | ICD-10-CM | POA: Diagnosis not present

## 2018-08-12 MED FILL — ZALEPLON 10 MG CAPSULE: 10 | 75 days supply | Qty: 150 | Fill #1

## 2018-08-27 DIAGNOSIS — G4700 Insomnia, unspecified: Secondary | ICD-10-CM | POA: Diagnosis not present

## 2018-08-27 DIAGNOSIS — F419 Anxiety disorder, unspecified: Secondary | ICD-10-CM | POA: Diagnosis not present

## 2018-08-27 DIAGNOSIS — G4733 Obstructive sleep apnea (adult) (pediatric): Secondary | ICD-10-CM | POA: Diagnosis not present

## 2018-08-27 DIAGNOSIS — R740 Nonspecific elevation of levels of transaminase and lactic acid dehydrogenase [LDH]: Secondary | ICD-10-CM | POA: Diagnosis not present

## 2018-08-27 DIAGNOSIS — Z6833 Body mass index (BMI) 33.0-33.9, adult: Secondary | ICD-10-CM | POA: Diagnosis not present

## 2018-08-27 DIAGNOSIS — F3181 Bipolar II disorder: Secondary | ICD-10-CM | POA: Diagnosis not present

## 2018-08-27 DIAGNOSIS — F900 Attention-deficit hyperactivity disorder, predominantly inattentive type: Secondary | ICD-10-CM | POA: Diagnosis not present

## 2018-08-27 DIAGNOSIS — I10 Essential (primary) hypertension: Secondary | ICD-10-CM | POA: Diagnosis not present

## 2018-08-27 MED FILL — VYVANSE 60 MG CAPSULE: 60 | 90 days supply | Qty: 90 | Fill #0

## 2018-08-27 MED FILL — AMLODIPINE BESYLATE 5 MG TA: 5 | 90 days supply | Qty: 90 | Fill #0

## 2018-08-27 MED FILL — LITHIUM CARBONATE 150 MG CA: 150 | 90 days supply | Qty: 90 | Fill #0

## 2018-08-27 MED FILL — METOPROLOL SUCCINATE ER 100: 100 | 90 days supply | Qty: 90 | Fill #0

## 2018-08-28 MED FILL — OXcarbazepine 300 MG TABS: 300 | 30 days supply | Qty: 210 | Fill #0

## 2018-08-28 MED FILL — SUBVENITE 100 MG TABS: 100 | 90 days supply | Qty: 90 | Fill #0

## 2018-09-24 MED FILL — OXcarbazepine 300 MG TABS: 300 | 30 days supply | Qty: 210 | Fill #1

## 2018-10-08 MED FILL — buPROPion HCL ER (XL) 300 M: 300 | 90 days supply | Qty: 90 | Fill #0

## 2018-10-29 MED FILL — OXcarbazepine 300 MG TABS: 300 | 30 days supply | Qty: 220 | Fill #0

## 2018-11-20 MED FILL — AMLODIPINE BESYLATE 5 MG TA: 5 | 90 days supply | Qty: 90 | Fill #1

## 2018-11-20 MED FILL — METOPROLOL SUCCINATE ER 100: 100 | 90 days supply | Qty: 90 | Fill #1

## 2018-11-26 MED FILL — VYVANSE 60 MG CAPSULE: 60 | 90 days supply | Qty: 90 | Fill #0

## 2018-11-29 MED FILL — OXcarbazepine 300 MG TABS: 300 | 30 days supply | Qty: 220 | Fill #1

## 2018-12-01 MED FILL — LITHIUM CARBONATE 150 MG CA: 150 | 90 days supply | Qty: 90 | Fill #1

## 2018-12-03 MED FILL — SUBVENITE 100 MG TABS: 100 | 90 days supply | Qty: 90 | Fill #1

## 2018-12-09 MED FILL — ZALEPLON 10 MG CAPSULE: 10 | 75 days supply | Qty: 150 | Fill #0

## 2018-12-30 MED FILL — buPROPion HCL ER (XL) 300 M: 300 | 90 days supply | Qty: 90 | Fill #1

## 2018-12-30 MED FILL — OXcarbazepine 300 MG TABS: 300 | 30 days supply | Qty: 220 | Fill #2

## 2019-01-12 ENCOUNTER — Other Ambulatory Visit: Payer: Self-pay

## 2019-01-12 ENCOUNTER — Encounter (HOSPITAL_COMMUNITY): Payer: Self-pay | Admitting: Emergency Medicine

## 2019-01-12 ENCOUNTER — Emergency Department (HOSPITAL_COMMUNITY): Payer: 59 | Admitting: Anesthesiology

## 2019-01-12 ENCOUNTER — Emergency Department (HOSPITAL_COMMUNITY): Payer: 59

## 2019-01-12 ENCOUNTER — Ambulatory Visit (HOSPITAL_COMMUNITY)
Admission: EM | Admit: 2019-01-12 | Discharge: 2019-01-12 | Disposition: A | Discharge status: Home / Self Care | Payer: 59 | Attending: Orthopaedic Surgery | Admitting: Orthopaedic Surgery

## 2019-01-12 ENCOUNTER — Other Ambulatory Visit (HOSPITAL_COMMUNITY): Payer: Self-pay | Admitting: Orthopaedic Surgery

## 2019-01-12 ENCOUNTER — Encounter (HOSPITAL_COMMUNITY)
Admission: EM | Disposition: A | Discharge status: Home / Self Care | Payer: Self-pay | Source: Home / Self Care | Attending: Emergency Medicine

## 2019-01-12 DIAGNOSIS — F319 Bipolar disorder, unspecified: Secondary | ICD-10-CM | POA: Diagnosis not present

## 2019-01-12 DIAGNOSIS — S42454A Nondisplaced fracture of lateral condyle of right humerus, initial encounter for closed fracture: Secondary | ICD-10-CM | POA: Diagnosis not present

## 2019-01-12 DIAGNOSIS — S43001A Unspecified subluxation of right shoulder joint, initial encounter: Secondary | ICD-10-CM | POA: Diagnosis not present

## 2019-01-12 DIAGNOSIS — Z9621 Cochlear implant status: Secondary | ICD-10-CM | POA: Diagnosis not present

## 2019-01-12 DIAGNOSIS — S4291XA Fracture of right shoulder girdle, part unspecified, initial encounter for closed fracture: Secondary | ICD-10-CM | POA: Diagnosis not present

## 2019-01-12 DIAGNOSIS — W109XXA Fall (on) (from) unspecified stairs and steps, initial encounter: Secondary | ICD-10-CM | POA: Insufficient documentation

## 2019-01-12 DIAGNOSIS — S42291A Other displaced fracture of upper end of right humerus, initial encounter for closed fracture: Secondary | ICD-10-CM | POA: Insufficient documentation

## 2019-01-12 DIAGNOSIS — Z882 Allergy status to sulfonamides status: Secondary | ICD-10-CM | POA: Diagnosis not present

## 2019-01-12 DIAGNOSIS — Z79899 Other long term (current) drug therapy: Secondary | ICD-10-CM | POA: Diagnosis not present

## 2019-01-12 DIAGNOSIS — G4730 Sleep apnea, unspecified: Secondary | ICD-10-CM | POA: Diagnosis not present

## 2019-01-12 DIAGNOSIS — S42201A Unspecified fracture of upper end of right humerus, initial encounter for closed fracture: Secondary | ICD-10-CM | POA: Diagnosis not present

## 2019-01-12 DIAGNOSIS — S43004A Unspecified dislocation of right shoulder joint, initial encounter: Secondary | ICD-10-CM | POA: Diagnosis not present

## 2019-01-12 DIAGNOSIS — G4733 Obstructive sleep apnea (adult) (pediatric): Secondary | ICD-10-CM | POA: Insufficient documentation

## 2019-01-12 DIAGNOSIS — Y9301 Activity, walking, marching and hiking: Secondary | ICD-10-CM | POA: Insufficient documentation

## 2019-01-12 DIAGNOSIS — G8918 Other acute postprocedural pain: Secondary | ICD-10-CM | POA: Diagnosis not present

## 2019-01-12 DIAGNOSIS — M5412 Radiculopathy, cervical region: Secondary | ICD-10-CM | POA: Diagnosis not present

## 2019-01-12 DIAGNOSIS — Z09 Encounter for follow-up examination after completed treatment for conditions other than malignant neoplasm: Secondary | ICD-10-CM

## 2019-01-12 HISTORY — PX: ORIF HUMERUS FRACTURE: SHX2126

## 2019-01-12 LAB — CBC WITH DIFFERENTIAL/PLATELET
Abs Immature Granulocytes: 0.04 10*3/uL (ref 0.00–0.07)
Basophils Absolute: 0.1 10*3/uL (ref 0.0–0.1)
Basophils Relative: 1 %
Eosinophils Absolute: 0 10*3/uL (ref 0.0–0.5)
Eosinophils Relative: 0 %
HCT: 42.3 % (ref 36.0–46.0)
Hemoglobin: 14.5 g/dL (ref 12.0–15.0)
Immature Granulocytes: 0 %
Lymphocytes Relative: 8 %
Lymphs Abs: 1 10*3/uL (ref 0.7–4.0)
MCH: 33.8 pg (ref 26.0–34.0)
MCHC: 34.3 g/dL (ref 30.0–36.0)
MCV: 98.6 fL (ref 80.0–100.0)
Monocytes Absolute: 1 10*3/uL (ref 0.1–1.0)
Monocytes Relative: 8 %
Neutro Abs: 10.2 10*3/uL — ABNORMAL HIGH (ref 1.7–7.7)
Neutrophils Relative %: 83 %
Platelets: 252 10*3/uL (ref 150–400)
RBC: 4.29 MIL/uL (ref 3.87–5.11)
RDW: 11.9 % (ref 11.5–15.5)
WBC: 12.3 10*3/uL — ABNORMAL HIGH (ref 4.0–10.5)
nRBC: 0 % (ref 0.0–0.2)

## 2019-01-12 LAB — BASIC METABOLIC PANEL
Anion gap: 10 (ref 5–15)
BUN: 16 mg/dL (ref 6–20)
CO2: 25 mmol/L (ref 22–32)
Calcium: 9.6 mg/dL (ref 8.9–10.3)
Chloride: 100 mmol/L (ref 98–111)
Creatinine, Ser: 0.89 mg/dL (ref 0.44–1.00)
GFR calc Af Amer: >60 mL/min (ref >60)
GFR calc non Af Amer: >60 mL/min (ref >60)
Glucose, Bld: 153 mg/dL — ABNORMAL HIGH (ref 70–99)
Potassium: 3.9 mmol/L (ref 3.5–5.1)
Sodium: 135 mmol/L (ref 135–145)

## 2019-01-12 SURGERY — OPEN REDUCTION INTERNAL FIXATION (ORIF) PROXIMAL HUMERUS FRACTURE
Anesthesia: General | Laterality: Right

## 2019-01-12 MED ORDER — ACETAMINOPHEN 500 MG PO TABS: 1000.0000 mg | ORAL_TABLET | Freq: Three times a day (TID) | ORAL | 0 refills | Status: AC

## 2019-01-12 MED ORDER — PROMETHAZINE HCL 25 MG/ML IJ SOLN
6.2500 mg | INTRAMUSCULAR | Status: DC | PRN
  Administered 2019-01-12: 6.25 mg via INTRAVENOUS

## 2019-01-12 MED ORDER — ONDANSETRON HCL 4 MG/2ML IJ SOLN
INTRAMUSCULAR | Status: AC
  Filled 2019-01-12: qty 2

## 2019-01-12 MED ORDER — LIDOCAINE 2% (20 MG/ML) 5 ML SYRINGE
INTRAMUSCULAR | Status: AC
  Filled 2019-01-12: qty 5

## 2019-01-12 MED ORDER — LACTATED RINGERS IV SOLN
INTRAVENOUS | Status: DC | PRN
  Administered 2019-01-12 (×2): via INTRAVENOUS

## 2019-01-12 MED ORDER — 0.9 % SODIUM CHLORIDE (POUR BTL) OPTIME
TOPICAL | Status: DC | PRN
  Administered 2019-01-12: 1000 mL

## 2019-01-12 MED ORDER — PHENYLEPHRINE 40 MCG/ML (10ML) SYRINGE FOR IV PUSH (FOR BLOOD PRESSURE SUPPORT)
PREFILLED_SYRINGE | INTRAVENOUS | Status: AC
  Filled 2019-01-12: qty 10

## 2019-01-12 MED ORDER — ROCURONIUM BROMIDE 50 MG/5ML IV SOSY
PREFILLED_SYRINGE | INTRAVENOUS | Status: AC
  Filled 2019-01-12: qty 5

## 2019-01-12 MED ORDER — BUPIVACAINE HCL (PF) 0.5 % IJ SOLN
INTRAMUSCULAR | Status: DC | PRN
  Administered 2019-01-12: 10 mL via PERINEURAL

## 2019-01-12 MED ORDER — MELOXICAM 7.5 MG PO TABS: 7.5000 mg | ORAL_TABLET | Freq: Every day | ORAL | 2 refills | Status: DC

## 2019-01-12 MED ORDER — MELOXICAM 7.5 MG PO TABS: 7.5000 mg | ORAL_TABLET | Freq: Every day | ORAL | 2 refills | Status: AC

## 2019-01-12 MED ORDER — SODIUM CHLORIDE 0.9 % IV SOLN
INTRAVENOUS | Status: DC | PRN
  Administered 2019-01-12: 75 ug/min via INTRAVENOUS

## 2019-01-12 MED ORDER — PROPOFOL 10 MG/ML IV BOLUS
INTRAVENOUS | Status: AC
  Filled 2019-01-12: qty 20

## 2019-01-12 MED ORDER — FENTANYL CITRATE (PF) 100 MCG/2ML IJ SOLN
INTRAMUSCULAR | Status: DC | PRN
  Administered 2019-01-12 (×2): 50 ug via INTRAVENOUS

## 2019-01-12 MED ORDER — VANCOMYCIN HCL 1000 MG IV SOLR
INTRAVENOUS | Status: AC
  Filled 2019-01-12: qty 1000

## 2019-01-12 MED ORDER — OXYCODONE HCL 5 MG PO TABS: ORAL_TABLET | ORAL | 0 refills | Status: AC

## 2019-01-12 MED ORDER — SUCCINYLCHOLINE 20MG/ML (10ML) SYRINGE FOR MEDFUSION PUMP - OPTIME
INTRAMUSCULAR | Status: DC | PRN
  Administered 2019-01-12: 100 mg via INTRAVENOUS

## 2019-01-12 MED ORDER — ONDANSETRON HCL 4 MG PO TABS: 4.0000 mg | ORAL_TABLET | Freq: Three times a day (TID) | ORAL | 1 refills | Status: AC | PRN

## 2019-01-12 MED ORDER — ROCURONIUM BROMIDE 50 MG/5ML IV SOSY
PREFILLED_SYRINGE | INTRAVENOUS | Status: DC | PRN
  Administered 2019-01-12: 50 mg via INTRAVENOUS

## 2019-01-12 MED ORDER — ACETAMINOPHEN 500 MG PO TABS: 1000.0000 mg | ORAL_TABLET | Freq: Three times a day (TID) | ORAL | 0 refills | Status: DC

## 2019-01-12 MED ORDER — FENTANYL CITRATE (PF) 250 MCG/5ML IJ SOLN
INTRAMUSCULAR | Status: AC
  Filled 2019-01-12: qty 5

## 2019-01-12 MED ORDER — EPHEDRINE SULFATE 50 MG/ML IJ SOLN
INTRAMUSCULAR | Status: DC | PRN
  Administered 2019-01-12 (×4): 10 mg via INTRAVENOUS

## 2019-01-12 MED ORDER — MORPHINE SULFATE (PF) 4 MG/ML IV SOLN
4.0000 mg | Freq: Once | INTRAVENOUS | Status: AC
  Administered 2019-01-12: 4 mg via INTRAVENOUS
  Filled 2019-01-12: qty 1

## 2019-01-12 MED ORDER — EPHEDRINE 5 MG/ML INJ
INTRAVENOUS | Status: AC
  Filled 2019-01-12: qty 10

## 2019-01-12 MED ORDER — MIDAZOLAM HCL 5 MG/5ML IJ SOLN
INTRAMUSCULAR | Status: DC | PRN
  Administered 2019-01-12: 1 mg via INTRAVENOUS

## 2019-01-12 MED ORDER — SUGAMMADEX SODIUM 500 MG/5ML IV SOLN
INTRAVENOUS | Status: AC
  Filled 2019-01-12: qty 5

## 2019-01-12 MED ORDER — MIDAZOLAM HCL 2 MG/2ML IJ SOLN
INTRAMUSCULAR | Status: AC
  Filled 2019-01-12: qty 2

## 2019-01-12 MED ORDER — FENTANYL CITRATE (PF) 100 MCG/2ML IJ SOLN: 25.0000 ug | INTRAMUSCULAR | Status: DC | PRN

## 2019-01-12 MED ORDER — LIDOCAINE 2% (20 MG/ML) 5 ML SYRINGE
INTRAMUSCULAR | Status: DC | PRN
  Administered 2019-01-12: 60 mg via INTRAVENOUS

## 2019-01-12 MED ORDER — PROMETHAZINE HCL 25 MG/ML IJ SOLN
INTRAMUSCULAR | Status: AC
  Filled 2019-01-12: qty 1

## 2019-01-12 MED ORDER — SUGAMMADEX SODIUM 200 MG/2ML IV SOLN
INTRAVENOUS | Status: DC | PRN
  Administered 2019-01-12: 150 mg via INTRAVENOUS

## 2019-01-12 MED ORDER — SUCCINYLCHOLINE CHLORIDE 200 MG/10ML IV SOSY
PREFILLED_SYRINGE | INTRAVENOUS | Status: AC
  Filled 2019-01-12: qty 10

## 2019-01-12 MED ORDER — ONDANSETRON HCL 4 MG/2ML IJ SOLN
INTRAMUSCULAR | Status: DC | PRN
  Administered 2019-01-12: 4 mg via INTRAVENOUS

## 2019-01-12 MED ORDER — ARTIFICIAL TEARS OPHTHALMIC OINT
TOPICAL_OINTMENT | OPHTHALMIC | Status: AC
  Filled 2019-01-12: qty 3.5

## 2019-01-12 MED ORDER — VANCOMYCIN HCL 1000 MG IV SOLR
INTRAVENOUS | Status: DC | PRN
  Administered 2019-01-12: 1000 mg via TOPICAL

## 2019-01-12 MED ORDER — OXYCODONE HCL 5 MG PO TABS: ORAL_TABLET | ORAL | 0 refills | Status: DC

## 2019-01-12 MED ORDER — BUPIVACAINE LIPOSOME 1.3 % IJ SUSP
INTRAMUSCULAR | Status: DC | PRN
  Administered 2019-01-12: 10 mL via PERINEURAL

## 2019-01-12 MED ORDER — ONDANSETRON HCL 4 MG PO TABS: 4.0000 mg | ORAL_TABLET | Freq: Three times a day (TID) | ORAL | 1 refills | Status: DC | PRN

## 2019-01-12 MED ORDER — DEXAMETHASONE SODIUM PHOSPHATE 10 MG/ML IJ SOLN
INTRAMUSCULAR | Status: AC
  Filled 2019-01-12: qty 1

## 2019-01-12 MED ORDER — ONDANSETRON HCL 4 MG/2ML IJ SOLN
4.0000 mg | Freq: Once | INTRAMUSCULAR | Status: AC
  Administered 2019-01-12: 4 mg via INTRAVENOUS
  Filled 2019-01-12: qty 2

## 2019-01-12 MED ORDER — CEFAZOLIN SODIUM-DEXTROSE 2-3 GM-%(50ML) IV SOLR
INTRAVENOUS | Status: DC | PRN
  Administered 2019-01-12: 2 g via INTRAVENOUS

## 2019-01-12 MED ORDER — CEFAZOLIN SODIUM-DEXTROSE 2-4 GM/100ML-% IV SOLN
INTRAVENOUS | Status: AC
  Filled 2019-01-12: qty 100

## 2019-01-12 MED ORDER — DEXAMETHASONE SODIUM PHOSPHATE 10 MG/ML IJ SOLN
INTRAMUSCULAR | Status: DC | PRN
  Administered 2019-01-12: 10 mg via INTRAVENOUS

## 2019-01-12 MED ORDER — PROPOFOL 10 MG/ML IV BOLUS
INTRAVENOUS | Status: DC | PRN
  Administered 2019-01-12: 140 mg via INTRAVENOUS

## 2019-01-12 SURGICAL SUPPLY — 62 items
AID PSTN UNV HD RSTRNT DISP (MISCELLANEOUS) ×1
BIT DRILL 3.2 (BIT) ×3
BIT DRILL 3.2XCALB NS DISP (BIT) IMPLANT
BIT DRILL CALIBRATED 2.7 (BIT) ×1 IMPLANT
BIT DRILL CALIBRATED 2.7MM (BIT) ×1
BIT DRL 3.2XCALB NS DISP (BIT) ×1
BONE CANC CHIPS 20CC PCAN1/4 (Bone Implant) ×3 IMPLANT
CHIPS CANC BONE 20CC PCAN1/4 (Bone Implant) ×1 IMPLANT
CHLORAPREP W/TINT 26ML (MISCELLANEOUS) ×3 IMPLANT
CLOSURE WOUND 1/2 X4 (GAUZE/BANDAGES/DRESSINGS) ×1
COVER SURGICAL LIGHT HANDLE (MISCELLANEOUS) ×4 IMPLANT
DRAPE C-ARM 42X72 X-RAY (DRAPES) ×3 IMPLANT
DRAPE HALF SHEET 40X57 (DRAPES) ×3 IMPLANT
DRAPE IMP U-DRAPE 54X76 (DRAPES) ×3 IMPLANT
DRAPE INCISE IOBAN 66X45 STRL (DRAPES) ×5 IMPLANT
DRAPE ORTHO SPLIT 77X108 STRL (DRAPES) ×6
DRAPE SURG ORHT 6 SPLT 77X108 (DRAPES) ×2 IMPLANT
DRAPE U-SHAPE 47X51 STRL (DRAPES) ×3 IMPLANT
DRSG AQUACEL AG ADV 3.5X 6 (GAUZE/BANDAGES/DRESSINGS) ×2 IMPLANT
ELECT REM PT RETURN 9FT ADLT (ELECTROSURGICAL) ×3
ELECTRODE REM PT RTRN 9FT ADLT (ELECTROSURGICAL) ×1 IMPLANT
GLOVE BIOGEL PI IND STRL 8 (GLOVE) ×1 IMPLANT
GLOVE BIOGEL PI INDICATOR 8 (GLOVE) ×2
GLOVE ECLIPSE 8.0 STRL XLNG CF (GLOVE) ×5 IMPLANT
GOWN STRL REUS W/ TWL LRG LVL3 (GOWN DISPOSABLE) ×3 IMPLANT
GOWN STRL REUS W/ TWL XL LVL3 (GOWN DISPOSABLE) ×1 IMPLANT
GOWN STRL REUS W/TWL LRG LVL3 (GOWN DISPOSABLE) ×6
GOWN STRL REUS W/TWL XL LVL3 (GOWN DISPOSABLE) ×3
GRAFT BNE CANC CHIPS 1-8 20CC (Bone Implant) IMPLANT
K-WIRE 2X5 SS THRDED S3 (WIRE) ×3
KIT BASIN OR (CUSTOM PROCEDURE TRAY) ×3 IMPLANT
KIT TURNOVER KIT B (KITS) ×3 IMPLANT
KWIRE 2X5 SS THRDED S3 (WIRE) IMPLANT
MANIFOLD NEPTUNE II (INSTRUMENTS) ×3 IMPLANT
NS IRRIG 1000ML POUR BTL (IV SOLUTION) ×3 IMPLANT
PACK SHOULDER (CUSTOM PROCEDURE TRAY) ×3 IMPLANT
PAD ARMBOARD 7.5X6 YLW CONV (MISCELLANEOUS) ×6 IMPLANT
PEG LOCKING 3.2X36 (Screw) ×4 IMPLANT
PEG LOCKING 3.2X38 (Screw) ×2 IMPLANT
PLATE PROX HUM HI R 3H 80 (Plate) ×2 IMPLANT
RESTRAINT HEAD UNIVERSAL NS (MISCELLANEOUS) ×3 IMPLANT
SCREW LOCK CORT STAR 3.5X24 (Screw) ×2 IMPLANT
SCREW LP NL T15 3.5X24 (Screw) ×2 IMPLANT
SLEEVE MEASURING 3.2 (BIT) ×2 IMPLANT
SPONGE LAP 18X18 RF (DISPOSABLE) ×4 IMPLANT
STRIP CLOSURE SKIN 1/2X4 (GAUZE/BANDAGES/DRESSINGS) ×1 IMPLANT
SUCTION FRAZIER HANDLE 10FR (MISCELLANEOUS) ×2
SUCTION TUBE FRAZIER 10FR DISP (MISCELLANEOUS) ×1 IMPLANT
SUT BROADBAND TAPE 2PK 1.5 (SUTURE) ×2 IMPLANT
SUT ETHIBOND NAB CT1 #1 30IN (SUTURE) ×2 IMPLANT
SUT ETHILON 4 0 FS 1 (SUTURE) ×2 IMPLANT
SUT MAXBRAID (SUTURE) ×2 IMPLANT
SUT MNCRL AB 4-0 PS2 18 (SUTURE) ×2 IMPLANT
SUT VIC AB 2-0 CT1 27 (SUTURE) ×6
SUT VIC AB 2-0 CT1 TAPERPNT 27 (SUTURE) ×2 IMPLANT
SUT VIC AB 3-0 CT1 27 (SUTURE) ×3
SUT VIC AB 3-0 CT1 TAPERPNT 27 (SUTURE) IMPLANT
TOWEL OR 17X26 10 PK STRL BLUE (TOWEL DISPOSABLE) ×3 IMPLANT
TUBE CONNECTING 12'X1/4 (SUCTIONS) ×1
TUBE CONNECTING 12X1/4 (SUCTIONS) ×2 IMPLANT
WATER STERILE IRR 1000ML POUR (IV SOLUTION) ×3 IMPLANT
YANKAUER SUCT BULB TIP NO VENT (SUCTIONS) ×3 IMPLANT

## 2019-01-12 NOTE — ED Provider Notes (Signed)
[facility] EMERGENCY DEPARTMENT Provider Note   CSN: [id number] Arrival date & time: 01/12/19  1448    History   Chief Complaint Chief Complaint  Patient presents with  . Shoulder Injury    HPI Quida Glasser is a 58 y.o. female here with R shoulder pain. Patient had a mechanical fall onto the right shoulder while going up the stairs yesterday. She had right shoulder pain afterwards and unable to move her shoulder. Denies any head injury or LOC. Went to [facility] office today and had xray that showed dislocation and possible fracture. The office attempted to perform reduction in the office and was unsuccessful and sent in for surgery with Dr. Griffin Basil. Patient's last meal is 7 am this morning.      The history is provided by the patient.    Past Medical History:  Diagnosis Date  . Bipolar disorder ([place])   . Depression   . Hearing impairment   . Sleep apnea    uses dental appliance    Patient Active Problem List   Diagnosis Date Noted  . Post-op pain 12/27/2016  . OSA (obstructive sleep apnea) 08/25/2014    Past Surgical History:  Procedure Laterality Date  . APPENDECTOMY    . COCHLEAR IMPLANT Left 12/27/2016   Procedure: COCHLEAR IMPLANT LEFT EAR;  Surgeon: Vicie Mutters, MD;  Location: [place];  Service: ENT;  Laterality: Left;  . RADIOACTIVE SEED GUIDED EXCISIONAL BREAST BIOPSY Right 11/07/2016   Procedure: RADIOACTIVE SEED GUIDED EXCISIONAL BREAST BIOPSY;  Surgeon: Rolm Bookbinder, MD;  Location: [place];  Service: General;  Laterality: Right;  . TUBAL LIGATION       OB History   No obstetric history on file.      Home Medications    Prior to Admission medications   Medication Sig Start Date End Date Taking? Authorizing Provider  buPROPion (WELLBUTRIN XL) 150 MG 24 hr tablet Take 300 mg by mouth daily.    [provider]  HYDROcodone-acetaminophen (NORCO) 10-325 MG tablet Take 1 tablet by  mouth every 6 (six) hours as needed. 11/07/16   Rolm Bookbinder, MD  lamoTRIgine (LAMICTAL) 150 MG tablet Take 150 mg by mouth daily.     [provider]  lisdexamfetamine (VYVANSE) 40 MG capsule Take 40 mg by mouth every morning.    [provider]  ziprasidone (GEODON) 40 MG capsule Take 20 mg by mouth every morning.     [provider]  ziprasidone (GEODON) 60 MG capsule Take 60 mg by mouth as directed.    [provider]    Family History Family History  Problem Relation Age of Onset  . Heart disease Maternal Grandfather   . Heart disease Paternal Grandmother     Social History Social History   Tobacco Use  . Smoking status: Never Smoker  . Smokeless tobacco: Never Used  Substance Use Topics  . Alcohol use: Yes    Comment: occasionally  . Drug use: No     Allergies   Sulfa antibiotics   Review of Systems Review of Systems  Musculoskeletal:       R shoulder pain   All other systems reviewed and are negative.    Physical Exam Updated Vital Signs Pulse (!) 57   Temp 97.7 F (36.5 C) (Oral)   Resp 18   Ht 5\' 2"  (1.575 m)   Wt 74.8 kg   SpO2 99%   BMI 30.18 kg/m   Physical Exam  Vitals signs and nursing note reviewed.  Constitutional:      Appearance: Normal appearance.  HENT:     Head: Normocephalic.     Right Ear: Tympanic membrane normal.     Left Ear: Tympanic membrane normal.     Nose: Nose normal.     Mouth/Throat:     Mouth: Mucous membranes are moist.  Eyes:     Extraocular Movements: Extraocular movements intact.     Pupils: Pupils are equal, round, and reactive to light.  Neck:     Musculoskeletal: Normal range of motion.  Cardiovascular:     Rate and Rhythm: Normal rate.  Pulmonary:     Effort: Pulmonary effort is normal.  Abdominal:     General: Abdomen is flat.  Musculoskeletal:     Comments: Obvious anterior R shoulder dislocation. 2+ radial pulse, no elbow or forearm tenderness. Able to hand  grasp   Skin:    General: Skin is warm.     Capillary Refill: Capillary refill takes less than 2 seconds.  Neurological:     General: No focal deficit present.     Mental Status: She is alert and oriented to person, place, and time.  Psychiatric:        Mood and Affect: Mood normal.        Behavior: Behavior normal.      ED Treatments / Results  Labs (all labs ordered are listed, but only abnormal results are displayed) Labs Reviewed  CBC WITH DIFFERENTIAL/PLATELET  BASIC METABOLIC PANEL    EKG None  Radiology No results found.  Procedures Procedures (including critical care time)  Medications Ordered in ED Medications  morphine 4 MG/ML injection 4 mg (has no administration in time range)  ondansetron (ZOFRAN) injection 4 mg (has no administration in time range)     Initial Impression / Assessment and Plan / ED Course  I have reviewed the triage vital signs and the nursing notes.  Pertinent labs & imaging results that were available during my care of the patient were reviewed by me and considered in my medical decision making (see chart for details).       Loreta Blouch is a 58 y.o. female here with R shoulder pain. I reviewed xray from office. Patient has anterior shoulder dislocation. Neurovascular intact. NPO since 7 am. Will give pain meds and she is planned for surgery.   3:18 PM OR called and patient to go up to PACU. Given pain meds.    Final Clinical Impressions(s) / ED Diagnoses   Final diagnoses:  Dislocation of right shoulder joint, initial encounter    ED Discharge Orders    None       Drenda Freeze, MD 01/12/19 [id number]

## 2019-01-12 NOTE — Consult Note (Signed)
ORTHOPAEDIC CONSULTATION  REQUESTING PHYSICIAN: Drenda Freeze, MD  Chief Complaint: Right proximal humerus fracture dislocation  HPI: Kimberly Haynes is a 58 y.o. female with  Fall last night resulting in R shoulder injury.  Evaluated in urgent care and determined to have a right proximal humerus fracture dislocation.  Sent to ER for management.  No other injuries.  Reports pain in R shoulder, failed closed reduction attempt in urgent care with local.    Past Medical History:  Diagnosis Date  . Bipolar disorder ([place])   . Depression   . Hearing impairment   . Sleep apnea    uses dental appliance   Past Surgical History:  Procedure Laterality Date  . APPENDECTOMY    . COCHLEAR IMPLANT Left 12/27/2016   Procedure: COCHLEAR IMPLANT LEFT EAR;  Surgeon: Vicie Mutters, MD;  Location: [facility];  Service: ENT;  Laterality: Left;  . RADIOACTIVE SEED GUIDED EXCISIONAL BREAST BIOPSY Right 11/07/2016   Procedure: RADIOACTIVE SEED GUIDED EXCISIONAL BREAST BIOPSY;  Surgeon: Rolm Bookbinder, MD;  Location: [facility];  Service: General;  Laterality: Right;  . TUBAL LIGATION     Social History   Socioeconomic History  . Marital status: Married    Spouse name: Not on file  . Number of children: Not on file  . Years of education: Not on file  . Highest education level: Not on file  Occupational History  . Occupation: Environmental health practitioner  Social Needs  . Financial resource strain: Not on file  . Food insecurity:    Worry: Not on file    Inability: Not on file  . Transportation needs:    Medical: Not on file    Non-medical: Not on file  Tobacco Use  . Smoking status: Never Smoker  . Smokeless tobacco: Never Used  Substance and Sexual Activity  . Alcohol use: Yes    Comment: occasionally  . Drug use: No  . Sexual activity: Yes    Birth control/protection: Post-menopausal  Lifestyle  . Physical activity:    Days per week: Not on file    Minutes  per session: Not on file  . Stress: Not on file  Relationships  . Social connections:    Talks on phone: Not on file    Gets together: Not on file    Attends religious service: Not on file    Active member of club or organization: Not on file    Attends meetings of clubs or organizations: Not on file    Relationship status: Not on file  Other Topics Concern  . Not on file  Social History Narrative  . Not on file   Family History  Problem Relation Age of Onset  . Heart disease Maternal Grandfather   . Heart disease Paternal Grandmother    Allergies  Allergen Reactions  . Sulfa Antibiotics Nausea Only   Prior to Admission medications   Medication Sig Start Date End Date Taking? Authorizing Provider  buPROPion (WELLBUTRIN XL) 150 MG 24 hr tablet Take 300 mg by mouth daily.    [provider]  HYDROcodone-acetaminophen (NORCO) 10-325 MG tablet Take 1 tablet by mouth every 6 (six) hours as needed. 11/07/16   Rolm Bookbinder, MD  lamoTRIgine (LAMICTAL) 150 MG tablet Take 150 mg by mouth daily.     [provider]  lisdexamfetamine (VYVANSE) 40 MG capsule Take 40 mg by mouth every morning.    [provider]  ziprasidone (GEODON) 40 MG capsule Take  20 mg by mouth every morning.     [provider]  ziprasidone (GEODON) 60 MG capsule Take 60 mg by mouth as directed.    [provider]   No results found. Family History Reviewed and non-contributory, no pertinent history of problems with bleeding or anesthesia      Review of Systems 14 system ROS conducted and negative except for that noted in HPI   OBJECTIVE  Vitals: Patient Vitals for the past 8 hrs:  Temp Temp src Pulse Resp SpO2 Height Weight  01/12/19 1501 97.7 F (36.5 C) Oral (!) 57 18 99 % - -  01/12/19 1459 - - - - - _0  (1.575 m) 74.8 kg   General: Alert, no acute distress Cardiovascular: Warm extremities noted Respiratory: No cyanosis, no use of accessory  musculature GI: No organomegaly, abdomen is soft and non-tender Skin: No lesions in the area of chief complaint other than those listed below in MSK exam.  Neurologic: Sensation intact distally save for the below mentioned MSK exam Psychiatric: Patient is competent for consent with normal mood and affect Lymphatic: No swelling obvious and reported other than the area involved in the exam below Extremities  XIH:WTUUE rom shoulder due to known fracture,  full and painless ROM throughout hand with [facility] of 0. Weak ulnar motor/sensory function preop, axillary nerve function appears normal currently.  Sensation intact in medial, radial distributions. Well perfused digits.      Test Results Imaging Imaging reviewed from Urgent care demonstrating proximal humerus fracture dislocation with comminuted tuberosity fractures, no obvious other shaft or head involvement  Labs cbc No results for input(s): WBC, HGB, HCT, PLT in the last 72 hours.  Labs inflam No results for input(s): CRP in the last 72 hours.  Invalid input(s): ESR  Labs coag No results for input(s): INR, PTT in the last 72 hours.  Invalid input(s): PT  No results for input(s): NA, K, CL, CO2, GLUCOSE, BUN, CREATININE, CALCIUM in the last 72 hours.   ASSESSMENT AND PLAN: 58 y.o. female with the following: R proximal humerus fracture dislocation with tuberosity involvement  Due to the delayed presentation and dislocation we cannot be sure about the patient's blood supply to the humeral head.  Requires urgent reduction and since in the OR plan for ORIF.  Talked about axillary nerve injury, non-union, malunion and other concerns.  Infection and need for further surgery also discussed.  All questions answered.  Plan for closed vs open reduction and ORIF today.  Ok for dc after vs overnight observation status.

## 2019-01-12 NOTE — Op Note (Signed)
Orthopaedic Surgery Operative Note (CSN: [id number])  Kimberly Haynes  [birth year] Date of Surgery: 01/12/2019   Diagnoses:  Right proximal humerus fracture dislocation  Procedure: Closed reduction right proximal humeral fracture dislocation Open reduction internal fixation with allograft bone grafting of the proximal humerus fracture   Operative Finding Successful completion of planned procedure.  She was quite unstable with a repeat dislocation while being prepped and draped.  We were again able to close reduce her and there was a large bony defect with loss of cancellus bone due to her extensive articulation with her fracture site and her anterior glenoid.  Subscapularis and posterior cuff appear to be intact in the form of the teres however the superior cuff was attached to the bony fragmented pieces.  These were relatively comminuted and not amenable to fixation with the plate itself.  We will do bone graft the area and reapproximate the cuff of the tuberosity fragments.  Post-operative plan: The patient will be nonweightbearing in a sling for 6 weeks with therapy starting after postop day 7.  The patient will be charged home in the care of her husband who is a physician.  DVT prophylaxis not indicated an ambulatory upper extremity patient.  Pain control with PRN pain medication preferring oral medicines.  Follow up plan will be scheduled in approximately 7 days for incision check and XR.  Post-Op Diagnosis: Same Surgeons:Primary: Hiram Gash, MD Assistants: None Location: [facility] OR ROOM 07 Anesthesia: General Antibiotics: Ancef 2g preop, Vancomycin 1000mg  locally  Tourniquet time: * No tourniquets in log * Estimated Blood Loss: 989 Complications: None Specimens: None Implants: Implant Name Type Inv. Item Serial No. Manufacturer Lot No. LRB No. Used Action  BONE CANC CHIPS 20CC - [id number] Bone Implant BONE [facility] CHIPS 20CC [id number] [employer]  Right 1 Implanted   PLATE PROX HUM HI R 3H 80 - [id number] Plate PLATE PROX HUM HI R 3H 80  ZIMMER RECON(ORTH,TRAU,BIO,SG)  Right 1 Implanted  SCREW LP NL T15 3.5X24 - [id number] Screw SCREW LP NL T15 3.5X24  ZIMMER RECON(ORTH,TRAU,BIO,SG)  Right 1 Implanted  SCREW LOCK CORT STAR 3.5X24 - [id number] Screw SCREW LOCK CORT STAR 3.5X24  ZIMMER RECON(ORTH,TRAU,BIO,SG)  Right 1 Implanted  PEG LOCKING 3.2X36 - [id number] Screw PEG LOCKING 3.2X36  ZIMMER RECON(ORTH,TRAU,BIO,SG)  Right 2 Implanted  PEG LOCKING 3.2X38 - [id number] Screw PEG LOCKING 3.2X38  ZIMMER RECON(ORTH,TRAU,BIO,SG)  Right 1 Implanted    Indications for Surgery:   Kimberly Haynes is a 58 y.o. female with fall 24 hours ago resulting in a fracture dislocation of her proximal humerus.  She had a close reduction attempt in an urgent care however this failed we felt that due to her displaced tuberosities and the extensive injury she would benefit from an urgent closed reduction and operative fixation of her tuberosities.  Benefits and risks of operative and nonoperative management were discussed prior to surgery with patient/guardian(s) and informed consent form was completed.  Specific risks including infection, need for additional surgery, loss of fixation, AVN, nonunion, malunion and instability.   Procedure:   The patient was identified in the preoperative holding area where the surgical site was marked. The patient was taken to the OR where a procedural timeout was called and the above noted anesthesia was induced.  We performed a closed reduction after a timeout was performed and demonstrated reduction on fluoroscopy.  There was another instability event that was again closed reduced secondary to the large unstable area encompassed by the fracture.  The patient was positioned beachchair on [employer] table.  Preoperative antibiotics were dosed.  The patient's right shoulder was prepped and draped in the usual sterile fashion.  A second preoperative timeout was called.       We began with a deltopectoral approach about 6 cm in length.  We dissected through the skin sharply achieving hemostasis as we progressed and identified the deltopectoral interval.  The vein was mobilized and taken laterally.  We identified the interval itself and open the clavipectoral fascia before placing a Cabbell retractor in the typical fashion.  At this point we are able to internally rotate the arm and noted the large defect in the proximal humerus.  The cuff catch to shell of cortical bone and these fragments are not amenable to fixation on their own with plate and screw construct and must of been reduced with suture.  Under live fluoroscopy we were able to identify that the defect in the tuberosities was extremely large and the cuff would be over reduced into this defect we worried that they would end up having continued instability or deficit in the function of the shoulder secondary to this large tuberosity defect.  We felt at that point that allograft with cancellus chips would be appropriate.  About 20 cc of cancellus chips were used to fill the defect and try and make the proximal humerus look more appropriate in contour.  This allowed plate fixation to try and pin the cortical fragments underneath.  We irrigated prior to bone grafting and we were able to reduce the fragments appropriately.  We then placed a short proximal Biomet Alps plate under fluoroscopic guidance.  Once this was positioned cortical screws were placed in the shaft and pegs were placed in the head using minimal fixation as the head was not a separate piece.  We then used the past max braid sutures and tapes that were in the cuff to tie back into our plate.  Were happy the final reduction and noted on the direct lateral of the shaft that there was some fragments of cortical bone that were still sitting posteriorly however we felt that the cuff was appropriately reduced and that that was the more important factor.  The  incision was thoroughly irrigated and closed in a multilayer fashion with absorbable sutures after local vancomycin was placed. A sterile dressing was placed.  Sling was placed the patient was awoken from general anesthesia and taken to the PACU in stable condition without complication.

## 2019-01-12 NOTE — Anesthesia Preprocedure Evaluation (Signed)
Anesthesia Evaluation  Patient identified by MRN, date of birth, ID band Patient awake    Reviewed: Allergy & Precautions, NPO status , Patient's Chart, lab work & pertinent test results  Airway Mallampati: III  TM Distance: >3 FB Neck ROM: Full    Dental  (+) Teeth Intact, Dental Advisory Given   Pulmonary sleep apnea and Continuous Positive Airway Pressure Ventilation ,    Pulmonary exam normal breath sounds clear to auscultation       Cardiovascular negative cardio ROS Normal cardiovascular exam Rhythm:Regular Rate:Normal     Neuro/Psych PSYCHIATRIC DISORDERS Depression Bipolar Disorder Cochlear implant     GI/Hepatic negative GI ROS, Neg liver ROS,   Endo/Other  Obesity   Renal/GU negative Renal ROS     Musculoskeletal negative musculoskeletal ROS (+)   Abdominal   Peds  Hematology negative hematology ROS (+)   Anesthesia Other Findings Day of surgery medications reviewed with the patient.  Reproductive/Obstetrics                             Anesthesia Physical Anesthesia Plan  ASA: II  Anesthesia Plan: General   Post-op Pain Management:  Regional for Post-op pain   Induction: Intravenous  PONV Risk Score and Plan: 3 and Midazolam, Dexamethasone and Ondansetron  Airway Management Planned: Oral ETT  Additional Equipment:   Intra-op Plan:   Post-operative Plan: Extubation in OR  Informed Consent: I have reviewed the patients History and Physical, chart, labs and discussed the procedure including the risks, benefits and alternatives for the proposed anesthesia with the patient or authorized representative who has indicated his/her understanding and acceptance.     Dental advisory given  Plan Discussed with: CRNA  Anesthesia Plan Comments:         Anesthesia Quick Evaluation

## 2019-01-12 NOTE — ED Triage Notes (Signed)
Pt reports falling while going up the stairs. Pt went to ortho Dr who tried to manipulate shoulder with no success. Pt states Dr.Varkey plans on doing surgery today.

## 2019-01-12 NOTE — Anesthesia Postprocedure Evaluation (Signed)
Anesthesia Post Note  Patient: Kimberly Haynes  Procedure(s) Performed: OPEN REDUCTION INTERNAL FIXATION (ORIF) PROXIMAL HUMERUS FRACTURE (Right )     Patient location during evaluation: PACU Anesthesia Type: General Level of consciousness: awake and alert, awake and oriented Pain management: pain level controlled Vital Signs Assessment: post-procedure vital signs reviewed and stable Respiratory status: spontaneous breathing, nonlabored ventilation, respiratory function stable and patient connected to nasal cannula oxygen Cardiovascular status: blood pressure returned to baseline and stable Postop Assessment: no apparent nausea or vomiting Anesthetic complications: no    Last Vitals:  Vitals:   01/12/19 1915 01/12/19 1942  BP: 116/64 113/70  Pulse: 70 70  Resp: 16 18  Temp:  (!) 36.1 C  SpO2: 92%     Last Pain:  Vitals:   01/12/19 1930  TempSrc:   PainSc: 0-No pain                 Catalina Gravel

## 2019-01-12 NOTE — Transfer of Care (Signed)
Immediate Anesthesia Transfer of Care Note  Patient: Kimberly Haynes  Procedure(s) Performed: OPEN REDUCTION INTERNAL FIXATION (ORIF) PROXIMAL HUMERUS FRACTURE (Right )  Patient Location: PACU  Anesthesia Type:General  Level of Consciousness: awake  Airway & Oxygen Therapy: Patient Spontanous Breathing and Patient connected to nasal cannula oxygen  Post-op Assessment: Report given to RN and Post -op Vital signs reviewed and stable  Post vital signs: Reviewed and stable  Last Vitals:  Vitals Value Taken Time  BP 109/62 01/12/2019  6:56 PM  Temp    Pulse 69 01/12/2019  6:59 PM  Resp 21 01/12/2019  6:59 PM  SpO2 95 % 01/12/2019  6:59 PM  Vitals shown include unvalidated device data.  Last Pain:  Vitals:   01/12/19 1501  TempSrc: Oral  PainSc:          Complications: No apparent anesthesia complications

## 2019-01-12 NOTE — Anesthesia Procedure Notes (Signed)
Procedure Name: Intubation Date/Time: 01/12/2019 4:38 PM Performed by: Suzy Bouchard, CRNA Pre-anesthesia Checklist: Patient identified, Emergency Drugs available, Suction available, Patient being monitored and Timeout performed Patient Re-evaluated:Patient Re-evaluated prior to induction Oxygen Delivery Method: Circle system utilized Preoxygenation: Pre-oxygenation with 100% oxygen Induction Type: IV induction and Rapid sequence Laryngoscope Size: Miller and 2 Grade View: Grade I Tube type: Oral Tube size: 7.5 mm Number of attempts: 1 Airway Equipment and Method: Stylet Placement Confirmation: ETT inserted through vocal cords under direct vision,  positive ETCO2 and breath sounds checked- equal and bilateral Secured at: 21 cm Tube secured with: Tape Dental Injury: Teeth and Oropharynx as per pre-operative assessment

## 2019-01-12 NOTE — Anesthesia Procedure Notes (Signed)
Anesthesia Regional Block: Interscalene brachial plexus block   Pre-Anesthetic Checklist: ,, timeout performed, Correct Patient, Correct Site, Correct Laterality, Correct Procedure, Correct Position, site marked, Risks and benefits discussed,  Surgical consent,  Pre-op evaluation,  At surgeon's request and post-op pain management  Laterality: Right  Prep: chloraprep       Needles:  Injection technique: Single-shot  Needle Type: Echogenic Stimulator Needle     Needle Length: 5cm  Needle Gauge: 22     Additional Needles:   Procedures:,,,, ultrasound used (permanent image in chart),,,,  Narrative:  Start time: 01/12/2019 3:56 PM End time: 01/12/2019 4:01 PM Injection made incrementally with aspirations every 5 mL.  Performed by: Personally  Anesthesiologist: Catalina Gravel, MD  Additional Notes: Functioning IV was confirmed and monitors were applied.  A 4mm 22ga Arrow echogenic stimulator needle was used. Sterile prep and drape, hand hygiene, and sterile gloves were used.  Negative aspiration and negative test dose prior to incremental administration of local anesthetic. The patient tolerated the procedure well.  Ultrasound guidance: relevent anatomy identified, needle position confirmed, local anesthetic spread visualized around nerve(s), vascular puncture avoided.  Image printed for medical record.

## 2019-01-13 ENCOUNTER — Encounter (HOSPITAL_COMMUNITY): Payer: Self-pay | Admitting: Orthopaedic Surgery

## 2019-01-17 DIAGNOSIS — S42291D Other displaced fracture of upper end of right humerus, subsequent encounter for fracture with routine healing: Secondary | ICD-10-CM | POA: Diagnosis not present

## 2019-01-17 MED FILL — traMADol HCL 50 MG TABS: 50 | 7 days supply | Qty: 30 | Fill #0

## 2019-01-17 MED FILL — CELECOXIB 200 MG CAP: 200 | 45 days supply | Qty: 90 | Fill #0

## 2019-01-21 DIAGNOSIS — G4733 Obstructive sleep apnea (adult) (pediatric): Secondary | ICD-10-CM | POA: Diagnosis not present

## 2019-01-23 ENCOUNTER — Other Ambulatory Visit: Payer: Self-pay

## 2019-01-23 ENCOUNTER — Ambulatory Visit: Payer: 59 | Attending: Orthopaedic Surgery | Admitting: Physical Therapy

## 2019-01-23 ENCOUNTER — Encounter: Payer: Self-pay | Admitting: Physical Therapy

## 2019-01-23 DIAGNOSIS — M25511 Pain in right shoulder: Secondary | ICD-10-CM | POA: Diagnosis not present

## 2019-01-23 DIAGNOSIS — M25611 Stiffness of right shoulder, not elsewhere classified: Secondary | ICD-10-CM | POA: Insufficient documentation

## 2019-01-23 DIAGNOSIS — M6281 Muscle weakness (generalized): Secondary | ICD-10-CM | POA: Diagnosis not present

## 2019-01-23 NOTE — Therapy (Signed)
[place], [place], [address] Phone: [phone]   Fax:  [phone]  Physical Therapy Evaluation  Patient Details  Name: Kimberly Haynes MRN: [id number] Date of Birth: September 19, 1961 Referring Provider (PT): Dr Ophelia Charter    Encounter Date: 01/23/2019  PT End of Session - 01/23/19 1351    Visit Number  1    Number of Visits  24    Date for PT Re-Evaluation  04/17/19    PT Start Time  1145    PT Stop Time  1240    PT Time Calculation (min)  55 min    Activity Tolerance  Patient tolerated treatment well    Behavior During Therapy  [facility] for tasks assessed/performed       Past Medical History:  Diagnosis Date  . Bipolar disorder ([place])   . Depression   . Hearing impairment   . Sleep apnea    uses dental appliance    Past Surgical History:  Procedure Laterality Date  . APPENDECTOMY    . 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 ORIF HUMERUS FRACTURE Right 01/12/2019   Procedure: OPEN REDUCTION INTERNAL FIXATION (ORIF) PROXIMAL HUMERUS FRACTURE;  Surgeon: Hiram Gash, MD;  Location: [place];  Service: Orthopedics;  Laterality: Right;  . RADIOACTIVE SEED GUIDED EXCISIONAL BREAST BIOPSY Right 11/07/2016   Procedure: RADIOACTIVE SEED GUIDED EXCISIONAL BREAST BIOPSY;  Surgeon: Rolm Bookbinder, MD;  Location: [place];  Service: General;  Laterality: Right;  . TUBAL LIGATION      There were no vitals filed for this visit.   Subjective Assessment - 01/23/19 1202    Subjective  Patient had a right shoulder ORIF on 01/12/2019. She is currently in a sling. Her order is for passive ROM only. Her pain is well controlled. She has family home to assist her at this time.     Pertinent History  depression, bipolar     Limitations  Lifting;Writing;House hold activities    Diagnostic tests  nothing post op     Patient Stated Goals  to have functional use of her right UE     Currently in Pain?  Yes    Pain Score  3     Pain Location  Shoulder    Pain Orientation  Right    Pain Descriptors / Indicators  Aching    Pain Type  Acute pain    Pain Onset  More than a month ago    Pain Frequency  Constant    Aggravating Factors   certain movements     Pain Relieving Factors  rest     Effect of Pain on Daily Activities  unable to use her dominant arm          [facility] PT Assessment - 01/23/19 0001      Assessment   Medical Diagnosis  Right shoulder Fx/ORIF    Referring Provider (PT)  Dr Ophelia Charter     Onset Date/Surgical Date  01/12/19    Hand Dominance  Right    Next MD Visit  3 weeks    Prior Therapy  None       Precautions   Precautions  None      Restrictions   Weight Bearing Restrictions  No      Balance Screen   Has the patient fallen in the  past 6 months  Yes    How many times?  1    Has the patient had a decrease in activity level because of a fear of falling?   No    Is the patient reluctant to leave their home because of a fear of falling?   No      Prior Function   Level of Independence  Independent      Cognition   Overall Cognitive Status  Within Functional Limits for tasks assessed    Attention  Focused    Focused Attention  Appears intact    Memory  Appears intact    Awareness  Appears intact      Observation/Other Assessments   Skin Integrity  well healing surgical scar    Focus on Therapeutic Outcomes (FOTO)   79% limitation       Sensation   Light Touch  Appears Intact    Additional Comments  is having some numbness in her fingers. Sling was adjusted slightly       Coordination   Gross Motor Movements are Fluid and Coordinated  Yes    Fine Motor Movements are Fluid and Coordinated  Yes      ROM / Strength   AROM / PROM / Strength  PROM;Strength;AROM      AROM   Overall AROM Comments  not tested       PROM    Overall PROM Comments  not teted     PROM Assessment Site  Shoulder    Right/Left Shoulder  Left    Left Shoulder Flexion  35 Degrees    Left Shoulder ABduction  25 Degrees    Left Shoulder Internal Rotation  --   to the body with no abduction    Left Shoulder External Rotation  0 Degrees      Strength   Strength Assessment Site  Shoulder      Palpation   Palpation comment  no unexpected tenderness to palpation                 Objective measurements completed on examination: See above findings.              PT Education - 01/23/19 1351    Education Details  symptom mangement; POC going forward     Person(s) Educated  Patient    Methods  Explanation;Demonstration;Tactile cues;Verbal cues    Comprehension  Verbalized understanding;Returned demonstration;Verbal cues required;Tactile cues required       PT Short Term Goals - 01/23/19 1357      PT SHORT TERM GOAL #1   Title  Patient will demonstrate passive shoulder flexion to 60 degrees     Time  4    Period  Weeks    Status  New    Target Date  02/20/19      PT SHORT TERM GOAL #2   Title  Patient will begin AAROM to the right shoulder ( per MD guidlines)    Time  4   or per MD    Period  Weeks    Status  New    Target Date  02/20/19      PT SHORT TERM GOAL #3   Title  Patient will dmeonstrate 30 degrees of passive ROM     Time  4    Period  Weeks    Status  New        PT Long Term Goals - 01/23/19 1359      PT  LONG TERM GOAL #1   Title  Patient will reach behind her head with right hand to brush her hair     Time  12    Period  Weeks    Status  New    Target Date  04/17/19      PT LONG TERM GOAL #2   Title  Patient will use her right arm to feed herself     Time  12    Period  Weeks    Status  New    Target Date  04/17/19      PT LONG TERM GOAL #3   Title  Patient will tuck her shirt in behind her without increased pain with her right arm     Time  12    Period  Weeks    Status   New    Target Date  04/17/19             Plan - 01/23/19 1352    Clinical Impression Statement  Patient is a 58 year old female S/P R humeral fx and ORIF on 01/12/2019. She presents with expected limiations in passive and active ROM as well as strength. She has no functional use of the shoulder. Per prescition PROM until her next follow up with MD. Her pain is well controlled at this time. She was given an HEP for grip and wrist strengthening.     Personal Factors and Comorbidities  Comorbidity 2    Comorbidities  depression, bipolar     Examination-Activity Limitations  Bathing;Hygiene/Grooming;Reach Overhead;Carry;Dressing;Lift;Caring for Others    Examination-Participation Restrictions  Cleaning;Driving;Laundry;Yard Work;Meal Prep    Stability/Clinical Decision Making  Evolving/Moderate complexity    Clinical Decision Making  Moderate    Rehab Potential  Good    PT Frequency  3x / week   3x a week for the first 2-3 weeks 2nd to PROM only then ween down when she can perfrom home strengthening    PT Duration  12 weeks    PT Treatment/Interventions  ADLs/Self Care Home Management;Electrical Stimulation;Cryotherapy;Canalith Repostioning;Moist Heat;Ultrasound;DME Instruction;Gait training;Functional mobility training;Stair training;Therapeutic activities;Therapeutic exercise;Neuromuscular re-education;Patient/family education;Manual techniques;Passive range of motion;Taping    PT Next Visit Plan  PROM until follow up in 4 weeks. Patient was shown pendulums but in MD office she was instructed to perfrom very little motion with them. wrst strengthening and elobo movement s tolerated.     PT Home Exercise Plan  pendulum; towel squeeze; elbow pronation/supination    Consulted and Agree with Plan of Care  Patient       Patient will benefit from skilled therapeutic intervention in order to improve the following deficits and impairments:  Pain, Decreased activity tolerance, Decreased endurance,  Decreased strength, Decreased range of motion, Impaired UE functional use, Decreased knowledge of precautions, Decreased safety awareness  Visit Diagnosis: Acute pain of right shoulder  Stiffness of right shoulder, not elsewhere classified  Muscle weakness (generalized)     Problem List Patient Active Problem List   Diagnosis Date Noted  . Post-op pain 12/27/2016  . OSA (obstructive sleep apnea) 08/25/2014    Carney Living 01/23/2019, 2:02 PM  [facility] [address] [place], [place], [address] Phone: [phone]   Fax:  [phone]  Name: Kimberly Haynes MRN: [id number] Date of Birth: [birth year]

## 2019-01-23 NOTE — Patient Instructions (Signed)
Access Code: [email or web address]  URL: [email or web address]  Date: 01/23/2019  Prepared by: Carolyne Littles   Program Notes  The picture of the pendulum has much more movement then I want you to perfrom. Remember just move your hips nice and easy to get a little motion into the shoulder. Don't worry about the elbow right now. We will work on tat next visit.   Exercises  Flexion-Extension Shoulder Pendulum with Table Support - 10 reps - 3 sets - 3x daily - 7x weekly  Wrist Flexion Extension AROM with Fingers Curled and Palm Down - 10 reps - 3 sets - 3x daily - 7x weekly  Towel Roll Squeeze - 10 reps - 3 sets - 3x daily - 7x weekly

## 2019-01-27 ENCOUNTER — Other Ambulatory Visit: Payer: Self-pay

## 2019-01-27 ENCOUNTER — Ambulatory Visit: Payer: 59 | Admitting: Physical Therapy

## 2019-01-27 DIAGNOSIS — M6281 Muscle weakness (generalized): Secondary | ICD-10-CM | POA: Diagnosis not present

## 2019-01-27 DIAGNOSIS — M25611 Stiffness of right shoulder, not elsewhere classified: Secondary | ICD-10-CM | POA: Diagnosis not present

## 2019-01-27 DIAGNOSIS — M25511 Pain in right shoulder: Secondary | ICD-10-CM

## 2019-01-27 NOTE — Therapy (Signed)
[place], [place], [address] Phone: [phone]   Fax:  [phone]  Physical Therapy Treatment  Patient Details  Name: Kimberly Haynes MRN: [id number] Date of Birth: [birth year] Referring Provider (PT): Dr Ophelia Charter    Encounter Date: 01/27/2019  PT End of Session - 01/27/19 1310    Visit Number  2    Number of Visits  24    Date for PT Re-Evaluation  04/17/19    PT Start Time  1230    PT Stop Time  1313    PT Time Calculation (min)  43 min    Activity Tolerance  Patient tolerated treatment well    Behavior During Therapy  [facility] for tasks assessed/performed       Past Medical History:  Diagnosis Date  . Bipolar disorder ([place])   . Depression   . Hearing impairment   . Sleep apnea    uses dental appliance    Past Surgical History:  Procedure Laterality Date  . APPENDECTOMY    . 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 ORIF HUMERUS FRACTURE Right 01/12/2019   Procedure: OPEN REDUCTION INTERNAL FIXATION (ORIF) PROXIMAL HUMERUS FRACTURE;  Surgeon: Hiram Gash, MD;  Location: [place];  Service: Orthopedics;  Laterality: Right;  . RADIOACTIVE SEED GUIDED EXCISIONAL BREAST BIOPSY Right 11/07/2016   Procedure: RADIOACTIVE SEED GUIDED EXCISIONAL BREAST BIOPSY;  Surgeon: Rolm Bookbinder, MD;  Location: [place];  Service: General;  Laterality: Right;  . TUBAL LIGATION      There were no vitals filed for this visit.  Subjective Assessment - 01/27/19 1244    Subjective  Patient has reports it has been feeling fine. She has been working on her exercises.     Pertinent History  depression, bipolar     Limitations  Lifting;Writing;House hold activities    Diagnostic tests  nothing post op     Patient Stated Goals  to have functional use of her right UE     Currently in Pain?  Yes    Pain Score  3     Pain Location  Shoulder    Pain Orientation  Right    Pain Descriptors / Indicators  Aching    Pain Type  Acute pain    Pain Onset  More than a month ago    Pain Frequency  Constant    Aggravating Factors   cetain movements make her shoulder hurt worse     Pain Relieving Factors  rest     Effect of Pain on Daily Activities  unable to use her dominant arm                                PT Education - 01/27/19 1309    Education Details  reviewed HEp and symptom mangement     Person(s) Educated  Patient    Methods  Explanation;Demonstration;Verbal cues;Tactile cues    Comprehension  Verbalized understanding;Returned demonstration;Verbal cues required;Tactile cues required       PT Short Term Goals - 01/27/19 1331      PT SHORT TERM GOAL #1   Title  Patient will demonstrate passive shoulder flexion to 60 degrees     Baseline  per visual inspection reached.     Time  4  Period  Weeks    Status  On-going      PT SHORT TERM GOAL #2   Title  Patient will begin AAROM to the right shoulder ( per MD guidlines)    Time  4    Period  Weeks    Status  On-going      PT SHORT TERM GOAL #3   Title  Patient will demonstrate 30 degrees of passive ROM     Time  4    Period  Weeks    Status  On-going        PT Long Term Goals - 01/23/19 1359      PT LONG TERM GOAL #1   Title  Patient will reach behind her head with right hand to brush her hair     Time  12    Period  Weeks    Status  New    Target Date  04/17/19      PT LONG TERM GOAL #2   Title  Patient will use her right arm to feed herself     Time  12    Period  Weeks    Status  New    Target Date  04/17/19      PT LONG TERM GOAL #3   Title  Patient will tuck her shirt in behind her without increased pain with her right arm     Time  12    Period  Weeks    Status  New    Target Date  04/17/19            Plan - 01/27/19 1329    Clinical  Impression Statement  Reviewed pendulums with paitent. Patient tolerated well. Pateint also perfromed bicpes flexion without pain. Therapy otherwise focused on padssive ROm and light joint mobiliztion. Per visual inspection the patient has improved passive shoulder flexion. Her extenral ROM is about the same.     PT Treatment/Interventions  ADLs/Self Care Home Management;Electrical Stimulation;Cryotherapy;Canalith Repostioning;Moist Heat;Ultrasound;DME Instruction;Gait training;Functional mobility training;Stair training;Therapeutic activities;Therapeutic exercise;Neuromuscular re-education;Patient/family education;Manual techniques;Passive range of motion;Taping    PT Next Visit Plan  PROM until follow up in 4 weeks. Patient was shown pendulums but in MD office she was instructed to perfrom very little motion with them. wrst strengthening and elobo movement s tolerated.     PT Home Exercise Plan  pendulum; towel squeeze; elbow pronation/supination    Consulted and Agree with Plan of Care  Patient       Patient will benefit from skilled therapeutic intervention in order to improve the following deficits and impairments:     Visit Diagnosis: Acute pain of right shoulder  Stiffness of right shoulder, not elsewhere classified  Muscle weakness (generalized)     Problem List Patient Active Problem List   Diagnosis Date Noted  . Post-op pain 12/27/2016  . OSA (obstructive sleep apnea) 08/25/2014    Carney Living PT DPT 01/27/2019, 1:32 PM  [place], [place], [address] Phone: [phone]   Fax:  [phone]  Name: Kimberly Haynes MRN: [id number] Date of Birth: 1961-03-22

## 2019-01-29 ENCOUNTER — Ambulatory Visit: Payer: 59 | Attending: Orthopaedic Surgery | Admitting: Physical Therapy

## 2019-01-29 ENCOUNTER — Other Ambulatory Visit: Payer: Self-pay

## 2019-01-29 DIAGNOSIS — M25511 Pain in right shoulder: Secondary | ICD-10-CM | POA: Diagnosis not present

## 2019-01-29 DIAGNOSIS — M25611 Stiffness of right shoulder, not elsewhere classified: Secondary | ICD-10-CM | POA: Diagnosis not present

## 2019-01-29 DIAGNOSIS — M6281 Muscle weakness (generalized): Secondary | ICD-10-CM | POA: Diagnosis not present

## 2019-01-29 NOTE — Patient Instructions (Signed)
Access Code: [email or web address]  URL: [email or web address]  Date: 01/29/2019  Prepared by: Elsie Ra   Exercises  Supine Shoulder Flexion PROM with Hands Clasped - 10 reps - 3 sets - 2x daily - 6x weekly  Seated Shoulder Scaption Slide at Table Top with Forearm in Neutral - 10 reps - 3 sets - 2x daily - 6x weekly  Seated Shoulder Flexion Towel Slide at Table Top - 10 reps - 1-2 sets - 2-3x daily - 6x weekly  Seated Shoulder External Rotation PROM on Table - 10 reps - 1-2 sets - 5 hold - 2-3x daily - 6x weekly

## 2019-01-29 NOTE — Therapy (Signed)
[place], [place], [address] Phone: [phone]   Fax:  [phone]  Physical Therapy Treatment  Patient Details  Name: Kimberly Haynes MRN: [id number] Date of Birth: 06-06-1961 Referring Provider (PT): Dr Ophelia Charter    Encounter Date: 01/29/2019  PT End of Session - 01/29/19 1204    Visit Number  3    Number of Visits  24    Date for PT Re-Evaluation  04/17/19    PT Start Time  1100    PT Stop Time  1150    PT Time Calculation (min)  50 min    Activity Tolerance  Patient tolerated treatment well    Behavior During Therapy  [facility] for tasks assessed/performed       Past Medical History:  Diagnosis Date  . Bipolar disorder ([place])   . Depression   . Hearing impairment   . Sleep apnea    uses dental appliance    Past Surgical History:  Procedure Laterality Date  . APPENDECTOMY    . 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 ORIF HUMERUS FRACTURE Right 01/12/2019   Procedure: OPEN REDUCTION INTERNAL FIXATION (ORIF) PROXIMAL HUMERUS FRACTURE;  Surgeon: Hiram Gash, MD;  Location: [place];  Service: Orthopedics;  Laterality: Right;  . RADIOACTIVE SEED GUIDED EXCISIONAL BREAST BIOPSY Right 11/07/2016   Procedure: RADIOACTIVE SEED GUIDED EXCISIONAL BREAST BIOPSY;  Surgeon: Rolm Bookbinder, MD;  Location: [place];  Service: General;  Laterality: Right;  . TUBAL LIGATION      There were no vitals filed for this visit.  Subjective Assessment - 01/29/19 1111    Subjective  Relays compliance staying in sling, and compliance with HEP. She wont see MD again until end of April.    Patient Stated Goals  to have functional use of her right UE     Currently in Pain?  Yes    Pain Score  2     Pain Location  Shoulder    Pain Orientation  Right    Pain Descriptors /  Indicators  Aching    Pain Type  Surgical pain                       [facility] Adult PT Treatment/Exercise - 01/29/19 0001      Exercises   Exercises  Shoulder      Shoulder Exercises: Supine   Other Supine Exercises  self PROM flexion, hands clasped 10 reps      Shoulder Exercises: Seated   Other Seated Exercises  gripping green D grip X 25, digit extenson  x25, wrist flex, ext pronation, supination, elbow flexon all 20 reps AROM    Other Seated Exercises  pendulums X 20 CW,CCW, ant-post, lateral then table slides PROM X 10 for flexion, scaption, ER      Modalities   Modalities  Cryotherapy      Cryotherapy   Number Minutes Cryotherapy  5 Minutes    Cryotherapy Location  Shoulder    Type of Cryotherapy  Ice pack      Manual Therapy   Joint Mobilization  gentle grade distraction glides    Passive ROM  flexion, ext, ER             PT Education - 01/29/19 1204    Education Details  updated HEP  for more self PROM at home    Person(s) Educated  Patient    Methods  Explanation;Demonstration;Verbal cues;Handout    Comprehension  Verbalized understanding;Returned demonstration       PT Short Term Goals - 01/27/19 1331      PT SHORT TERM GOAL #1   Title  Patient will demonstrate passive shoulder flexion to 60 degrees     Baseline  per visual inspection reached.     Time  4    Period  Weeks    Status  On-going      PT SHORT TERM GOAL #2   Title  Patient will begin AAROM to the right shoulder ( per MD guidlines)    Time  4    Period  Weeks    Status  On-going      PT SHORT TERM GOAL #3   Title  Patient will demonstrate 30 degrees of passive ROM     Time  4    Period  Weeks    Status  On-going        PT Long Term Goals - 01/23/19 1359      PT LONG TERM GOAL #1   Title  Patient will reach behind her head with right hand to brush her hair     Time  12    Period  Weeks    Status  New    Target Date  04/17/19      PT LONG TERM GOAL #2   Title   Patient will use her right arm to feed herself     Time  12    Period  Weeks    Status  New    Target Date  04/17/19      PT LONG TERM GOAL #3   Title  Patient will tuck her shirt in behind her without increased pain with her right arm     Time  12    Period  Weeks    Status  New    Target Date  04/17/19            Plan - 01/29/19 1206    Clinical Impression Statement  Session focused on PROM to her shoulder to increase ROM. HEP was updated to add tableslide PROM and shoulder PROM into her program and she showed good understanding of these. PT will progress shoulder PROM as able until MD clears her for AROM.    Rehab Potential  Good    PT Frequency  3x / week    PT Duration  12 weeks    PT Treatment/Interventions  ADLs/Self Care Home Management;Electrical Stimulation;Cryotherapy;Canalith Repostioning;Moist Heat;Ultrasound;DME Instruction;Gait training;Functional mobility training;Stair training;Therapeutic activities;Therapeutic exercise;Neuromuscular re-education;Patient/family education;Manual techniques;Passive range of motion;Taping    PT Next Visit Plan  progress PROM for shoulder, okay for elbow/wrist AROM    PT Home Exercise Plan  pendulum; towel squeeze; elbow pronation/supination, self PROM supine, tableslide PROM (sliding body away opposed to sliding arm fwd)    Consulted and Agree with Plan of Care  Patient       Patient will benefit from skilled therapeutic intervention in order to improve the following deficits and impairments:  Pain, Decreased activity tolerance, Decreased endurance, Decreased strength, Decreased range of motion, Impaired UE functional use, Decreased knowledge of precautions, Decreased safety awareness  Visit Diagnosis: Acute pain of right shoulder  Stiffness of right shoulder, not elsewhere classified  Muscle weakness (generalized)     Problem List Patient Active Problem List   Diagnosis Date Noted  .  Post-op pain 12/27/2016  . OSA  (obstructive sleep apnea) 08/25/2014    [name] 01/29/2019, 12:19 PM  [place], [place], [address] Phone: [phone]   Fax:  [phone]  Name: Kimberly Haynes MRN: [id number] Date of Birth: 1961-10-05

## 2019-01-31 ENCOUNTER — Other Ambulatory Visit: Payer: Self-pay

## 2019-01-31 ENCOUNTER — Ambulatory Visit: Payer: 59 | Admitting: Physical Therapy

## 2019-01-31 DIAGNOSIS — M6281 Muscle weakness (generalized): Secondary | ICD-10-CM

## 2019-01-31 DIAGNOSIS — M25511 Pain in right shoulder: Secondary | ICD-10-CM

## 2019-01-31 DIAGNOSIS — M25611 Stiffness of right shoulder, not elsewhere classified: Secondary | ICD-10-CM

## 2019-01-31 MED FILL — OXcarbazepine 300 MG TABS: 300 | 30 days supply | Qty: 220 | Fill #3

## 2019-01-31 NOTE — Therapy (Signed)
[place], [place], [address] Phone: [phone]   Fax:  [phone]  Physical Therapy Treatment  Patient Details  Name: Kimberly Haynes MRN: [id number] Date of Birth: 24-Jun-1961 Referring Provider (PT): Dr Ophelia Charter    Encounter Date: 01/31/2019  PT End of Session - 01/31/19 1122    Visit Number  4    Number of Visits  24    Date for PT Re-Evaluation  04/17/19    PT Start Time  1102    PT Stop Time  1153    PT Time Calculation (min)  51 min    Activity Tolerance  Patient tolerated treatment well    Behavior During Therapy  [facility] for tasks assessed/performed;Flat affect       Past Medical History:  Diagnosis Date  . Bipolar disorder ([place])   . Depression   . Hearing impairment   . Sleep apnea    uses dental appliance    Past Surgical History:  Procedure Laterality Date  . APPENDECTOMY    . 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 ORIF HUMERUS FRACTURE Right 01/12/2019   Procedure: OPEN REDUCTION INTERNAL FIXATION (ORIF) PROXIMAL HUMERUS FRACTURE;  Surgeon: Hiram Gash, MD;  Location: [place];  Service: Orthopedics;  Laterality: Right;  . RADIOACTIVE SEED GUIDED EXCISIONAL BREAST BIOPSY Right 11/07/2016   Procedure: RADIOACTIVE SEED GUIDED EXCISIONAL BREAST BIOPSY;  Surgeon: Rolm Bookbinder, MD;  Location: [place];  Service: General;  Laterality: Right;  . TUBAL LIGATION      There were no vitals filed for this visit.  Subjective Assessment - 01/31/19 1109    Subjective  No pain at rest.  Sling off only for shower and exercise.  Has been doing the exercises.      Currently in Pain?  No/denies          [facility] Adult PT Treatment/Exercise - 01/31/19 0001      Elbow Exercises   Elbow Flexion  AROM;Right   seated at table      Shoulder  Exercises: Supine   External Rotation  AAROM;Right;10 reps    External Rotation Weight (lbs)  cane     Flexion  AAROM;Right;5 reps   and chest press cane x 10    Shoulder Flexion Weight (lbs)  used Lt UE     Other Supine Exercises  retraction x 10       Shoulder Exercises: Standing   Other Standing Exercises  pendulum 4 way with cuing to use her hips and legs to produce movement       Shoulder Exercises: Stretch   Table Stretch - Flexion  10 seconds    Table Stretch -Flexion Limitations  x 10 ensured passive    Table Stretch - Abduction  10 seconds    Table Stretch - ABduction Limitations  x 10 elbow bent     Table Stretch - External Rotation  10 seconds    Table Stretch - External Rotation Limitations  x 10       Wrist Exercises   Wrist Flexion  Strengthening;Right;20 reps    Bar Weights/Barbell (Wrist Flexion)  1 lb    Wrist Extension  Strengthening;Right;20 reps;Seated    Bar Weights/Barbell (Wrist Extension)  1 lb    Other wrist exercises  putty yellow, given to patient  Cryotherapy   Number Minutes Cryotherapy  8 Minutes    Cryotherapy Location  Shoulder    Type of Cryotherapy  Ice pack      Manual Therapy   Joint Mobilization  gentle grade distraction glides    Passive ROM  flexion, ext, ER             PT Education - 01/31/19 1119    Education Details  ensured she was doing all exercises correctly and assessing response     Person(s) Educated  Patient    Methods  Explanation    Comprehension  Verbalized understanding;Returned demonstration;Need further instruction       PT Short Term Goals - 01/27/19 1331      PT SHORT TERM GOAL #1   Title  Patient will demonstrate passive shoulder flexion to 60 degrees     Baseline  per visual inspection reached.     Time  4    Period  Weeks    Status  On-going      PT SHORT TERM GOAL #2   Title  Patient will begin AAROM to the right shoulder ( per MD guidlines)    Time  4    Period  Weeks    Status   On-going      PT SHORT TERM GOAL #3   Title  Patient will demonstrate 30 degrees of passive ROM     Time  4    Period  Weeks    Status  On-going        PT Long Term Goals - 01/23/19 1359      PT LONG TERM GOAL #1   Title  Patient will reach behind her head with right hand to brush her hair     Time  12    Period  Weeks    Status  New    Target Date  04/17/19      PT LONG TERM GOAL #2   Title  Patient will use her right arm to feed herself     Time  12    Period  Weeks    Status  New    Target Date  04/17/19      PT LONG TERM GOAL #3   Title  Patient will tuck her shirt in behind her without increased pain with her right arm     Time  12    Period  Weeks    Status  New    Target Date  04/17/19            Plan - 01/31/19 1124    Clinical Impression Statement  Focused on safe PROM, AAROM exercises for wrist, elbow and shoulder.  Needed min cues for technique of table slides.  PROM up to 100 deg flexion today.     Comorbidities  depression, bipolar     Examination-Activity Limitations  Bathing;Hygiene/Grooming;Reach Overhead;Carry;Dressing;Lift;Caring for Others    Examination-Participation Restrictions  Cleaning;Driving;Laundry;Yard Work;Meal Prep    PT Treatment/Interventions  ADLs/Self Care Home Management;Electrical Stimulation;Cryotherapy;Canalith Repostioning;Moist Heat;Ultrasound;DME Instruction;Gait training;Functional mobility training;Stair training;Therapeutic activities;Therapeutic exercise;Neuromuscular re-education;Patient/family education;Manual techniques;Passive range of motion;Taping    PT Next Visit Plan  progress PROM for shoulder, okay for elbow/wrist AROM. try pulleys and PT led isometrics     PT Home Exercise Plan  pendulum; towel squeeze; elbow pronation/supination, self PROM supine, tableslide PROM (sliding body away opposed to sliding arm fwd)    Consulted and Agree with Plan of Care  Patient  Patient will benefit from skilled  therapeutic intervention in order to improve the following deficits and impairments:  Pain, Decreased activity tolerance, Decreased endurance, Decreased strength, Decreased range of motion, Impaired UE functional use, Decreased knowledge of precautions, Decreased safety awareness  Visit Diagnosis: Acute pain of right shoulder  Stiffness of right shoulder, not elsewhere classified  Muscle weakness (generalized)     Problem List Patient Active Problem List   Diagnosis Date Noted  . Post-op pain 12/27/2016  . OSA (obstructive sleep apnea) 08/25/2014    , 01/31/2019, 11:54 AM  [facility] [address] [place], [place], [address] Phone: [phone]   Fax:  [phone]  Name: Kimberly Haynes MRN: [id number] Date of Birth: 1961/03/12  Raeford Razor, PT 01/31/19 11:55 AM Phone: [phone] Fax: [phone]

## 2019-02-03 ENCOUNTER — Telehealth: Payer: Self-pay | Admitting: Physical Therapy

## 2019-02-03 ENCOUNTER — Ambulatory Visit: Payer: 59 | Admitting: Physical Therapy

## 2019-02-03 ENCOUNTER — Other Ambulatory Visit: Payer: Self-pay

## 2019-02-03 DIAGNOSIS — M6281 Muscle weakness (generalized): Secondary | ICD-10-CM

## 2019-02-03 DIAGNOSIS — M25611 Stiffness of right shoulder, not elsewhere classified: Secondary | ICD-10-CM | POA: Diagnosis not present

## 2019-02-03 DIAGNOSIS — M25511 Pain in right shoulder: Secondary | ICD-10-CM

## 2019-02-03 NOTE — Telephone Encounter (Signed)
Opening in error

## 2019-02-03 NOTE — Telephone Encounter (Signed)
Called her surgeon to ask if it  Is okay to progress to pulleys. Right now only instructions are for PROM but she won't see MD until end of April.. Left message with office staff at MD office to call back PT office to inform what MD wants.  Elsie Ra, PT, DPT 02/03/19 2:44 PM

## 2019-02-03 NOTE — Therapy (Signed)
Kimberly Haynes, [place], [address] Phone: [phone]   Fax:  [phone]  Physical Therapy Treatment  Patient Details  Name: Kimberly Haynes MRN: [id number] Date of Birth: 01-15-1961 Referring Provider (PT): Dr Ophelia Charter    Encounter Date: 02/03/2019  PT End of Session - 02/03/19 1158    Visit Number  5    Number of Visits  24    Date for PT Re-Evaluation  04/17/19    PT Start Time  1105    PT Stop Time  1202   last 10 min on ice   PT Time Calculation (min)  57 min    Activity Tolerance  Patient tolerated treatment well    Behavior During Therapy  [facility] for tasks assessed/performed;Flat affect       Past Medical History:  Diagnosis Date  . Bipolar disorder ([place])   . Depression   . Hearing impairment   . Sleep apnea    uses dental appliance    Past Surgical History:  Procedure Laterality Date  . APPENDECTOMY    . 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 ORIF HUMERUS FRACTURE Right 01/12/2019   Procedure: OPEN REDUCTION INTERNAL FIXATION (ORIF) PROXIMAL HUMERUS FRACTURE;  Surgeon: Hiram Gash, MD;  Location: [place];  Service: Orthopedics;  Laterality: Right;  . RADIOACTIVE SEED GUIDED EXCISIONAL BREAST BIOPSY Right 11/07/2016   Procedure: RADIOACTIVE SEED GUIDED EXCISIONAL BREAST BIOPSY;  Surgeon: Rolm Bookbinder, MD;  Location: [place];  Service: General;  Laterality: Right;  . TUBAL LIGATION      There were no vitals filed for this visit.  Subjective Assessment - 02/03/19 1151    Subjective  My shoulder is a little sore but no pain upon arrival.    Currently in Pain?  No/denies                       [facility] Adult PT Treatment/Exercise - 02/03/19 0001      Elbow Exercises   Elbow Flexion  AROM;Right   30 reps, seated      Shoulder Exercises: Supine   Flexion  PROM;15 reps   hands clasped PROM     Shoulder Exercises: Seated   Other Seated Exercises  gripping green D grip X 25, digit  then wrist flex, ext pronation, supination all with 2 lbs arm supported      Shoulder Exercises: Standing   Other Standing Exercises  pendulum 4 way 20 ea with cuing to use her hips and legs to produce movement       Shoulder Exercises: Stretch   Other Shoulder Stretches  PROM table stretch PROM (sliding body away from table, not sliding arm) 3 sec  x20 for abd, flexion, ER      Cryotherapy   Number Minutes Cryotherapy  10 Minutes    Cryotherapy Location  Shoulder    Type of Cryotherapy  Ice pack      Manual Therapy   Joint Mobilization  gentle grade distraction glides    Passive ROM  flexion, ext, ER               PT Short Term Goals - 01/27/19 1331      PT SHORT TERM GOAL #1   Title  Patient will demonstrate passive shoulder flexion to 60 degrees  Baseline  per visual inspection reached.     Time  4    Period  Weeks    Status  On-going      PT SHORT TERM GOAL #2   Title  Patient will begin AAROM to the right shoulder ( per MD guidlines)    Time  4    Period  Weeks    Status  On-going      PT SHORT TERM GOAL #3   Title  Patient will demonstrate 30 degrees of passive ROM     Time  4    Period  Weeks    Status  On-going        PT Long Term Goals - 01/23/19 1359      PT LONG TERM GOAL #1   Title  Patient will reach behind her head with right hand to brush her hair     Time  12    Period  Weeks    Status  New    Target Date  04/17/19      PT LONG TERM GOAL #2   Title  Patient will use her right arm to feed herself     Time  12    Period  Weeks    Status  New    Target Date  04/17/19      PT LONG TERM GOAL #3   Title  Patient will tuck her shirt in behind her without increased pain with her right arm     Time  12    Period  Weeks    Status  New    Target Date  04/17/19             Plan - 02/03/19 1159    Clinical Impression Statement  Continued PROM on Rt shoulder per MD instructions and she is progressing well with this. Increased resistance for wrist strength with good tolerance. PT will continue to progress PROM as able.     Rehab Potential  Good    PT Frequency  3x / week    PT Duration  12 weeks    PT Treatment/Interventions  ADLs/Self Care Home Management;Electrical Stimulation;Cryotherapy;Canalith Repostioning;Moist Heat;Ultrasound;DME Instruction;Gait training;Functional mobility training;Stair training;Therapeutic activities;Therapeutic exercise;Neuromuscular re-education;Patient/family education;Manual techniques;Passive range of motion;Taping    PT Next Visit Plan  progress PROM for shoulder, okay for elbow/wrist AROM, not sure about pulleys     PT Home Exercise Plan  pendulum; towel squeeze; elbow pronation/supination, self PROM supine, tableslide PROM (sliding body away opposed to sliding arm fwd)    Consulted and Agree with Plan of Care  Patient       Patient will benefit from skilled therapeutic intervention in order to improve the following deficits and impairments:     Visit Diagnosis: Acute pain of right shoulder  Stiffness of right shoulder, not elsewhere classified  Muscle weakness (generalized)     Problem List Patient Active Problem List   Diagnosis Date Noted  . Post-op pain 12/27/2016  . OSA (obstructive sleep apnea) 08/25/2014    [name], PT,DPT 02/03/2019, 12:05 PM  [facility] [address] [place], [place], [address] Phone: [phone]   Fax:  [phone]  Name: Kimberly Haynes MRN: [id number] Date of Birth: 04-Jul-1961

## 2019-02-04 ENCOUNTER — Encounter: Payer: Self-pay | Admitting: Physical Therapy

## 2019-02-04 ENCOUNTER — Ambulatory Visit: Payer: 59 | Admitting: Physical Therapy

## 2019-02-04 DIAGNOSIS — M25611 Stiffness of right shoulder, not elsewhere classified: Secondary | ICD-10-CM

## 2019-02-04 DIAGNOSIS — M25511 Pain in right shoulder: Secondary | ICD-10-CM | POA: Diagnosis not present

## 2019-02-04 DIAGNOSIS — M6281 Muscle weakness (generalized): Secondary | ICD-10-CM | POA: Diagnosis not present

## 2019-02-05 ENCOUNTER — Encounter: Payer: Self-pay | Admitting: Physical Therapy

## 2019-02-05 ENCOUNTER — Other Ambulatory Visit: Payer: Self-pay

## 2019-02-05 ENCOUNTER — Ambulatory Visit: Payer: 59 | Admitting: Physical Therapy

## 2019-02-05 DIAGNOSIS — M25511 Pain in right shoulder: Secondary | ICD-10-CM | POA: Diagnosis not present

## 2019-02-05 DIAGNOSIS — M25611 Stiffness of right shoulder, not elsewhere classified: Secondary | ICD-10-CM | POA: Diagnosis not present

## 2019-02-05 DIAGNOSIS — M6281 Muscle weakness (generalized): Secondary | ICD-10-CM

## 2019-02-05 NOTE — Therapy (Signed)
[place], [place], [address] Phone: [phone]   Fax:  [phone]  Physical Therapy Treatment  Patient Details  Name: Kimberly Haynes MRN: [id number] Date of Birth: [birth year] Referring Provider (PT): Dr Ophelia Charter    Encounter Date: 02/05/2019  PT End of Session - 02/05/19 1205    Visit Number  7    Number of Visits  24    Date for PT Re-Evaluation  04/17/19    PT Start Time  1018    PT Stop Time  1059    PT Time Calculation (min)  41 min    Activity Tolerance  Patient tolerated treatment well    Behavior During Therapy  [facility] for tasks assessed/performed       Past Medical History:  Diagnosis Date  . Bipolar disorder ([place])   . Depression   . Hearing impairment   . Sleep apnea    uses dental appliance    Past Surgical History:  Procedure Laterality Date  . APPENDECTOMY    . 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 ORIF HUMERUS FRACTURE Right 01/12/2019   Procedure: OPEN REDUCTION INTERNAL FIXATION (ORIF) PROXIMAL HUMERUS FRACTURE;  Surgeon: Hiram Gash, MD;  Location: [place];  Service: Orthopedics;  Laterality: Right;  . RADIOACTIVE SEED GUIDED EXCISIONAL BREAST BIOPSY Right 11/07/2016   Procedure: RADIOACTIVE SEED GUIDED EXCISIONAL BREAST BIOPSY;  Surgeon: Rolm Bookbinder, MD;  Location: [place];  Service: General;  Laterality: Right;  . TUBAL LIGATION      There were no vitals filed for this visit.  Subjective Assessment - 02/05/19 1203    Subjective  Patient reports her shoulder felt good after her session yesterdya. She has no complaints this morning.     Pertinent History  depression, bipolar     Limitations  Lifting;Writing;House hold activities    Diagnostic tests  nothing post op     Patient Stated Goals  to have functional use of  her right UE     Currently in Pain?  No/denies                       [facility] Adult PT Treatment/Exercise - 02/05/19 1207      Shoulder Exercises: Supine   Other Supine Exercises  wand ER 2x10       Shoulder Exercises: Seated   Other Seated Exercises  table slides 2x10     Other Seated Exercises  scap retraction x20       Shoulder Exercises: Pulleys   Flexion Limitations  2 min       Manual Therapy   Joint Mobilization  Grade 1 and 2 inferior and posterior glides.     Soft tissue mobilization  to right upper trap and posterior shoulder     Passive ROM  flexion, abduction , ER             PT Education - 02/05/19 1204    Education Details  reviewed current HEP and technique     Person(s) Educated  Patient    Methods  Explanation;Demonstration;Tactile cues;Verbal cues;Handout    Comprehension  Verbalized understanding;Returned demonstration;Verbal cues required;Tactile cues required       PT Short Term Goals - 01/27/19 1331      PT SHORT TERM GOAL #1   Title  Patient will  demonstrate passive shoulder flexion to 60 degrees     Baseline  per visual inspection reached.     Time  4    Period  Weeks    Status  On-going      PT SHORT TERM GOAL #2   Title  Patient will begin AAROM to the right shoulder ( per MD guidlines)    Time  4    Period  Weeks    Status  On-going      PT SHORT TERM GOAL #3   Title  Patient will demonstrate 30 degrees of passive ROM     Time  4    Period  Weeks    Status  On-going        PT Long Term Goals - 01/23/19 1359      PT LONG TERM GOAL #1   Title  Patient will reach behind her head with right hand to brush her hair     Time  12    Period  Weeks    Status  New    Target Date  04/17/19      PT LONG TERM GOAL #2   Title  Patient will use her right arm to feed herself     Time  12    Period  Weeks    Status  New    Target Date  04/17/19      PT LONG TERM GOAL #3   Title  Patient will tuck her shirt in behind  her without increased pain with her right arm     Time  12    Period  Weeks    Status  New    Target Date  04/17/19            Plan - 02/05/19 1205    Clinical Impression Statement  Reviewed the exercises therapy wants her to focus on over the weekend. She will perform self PROM into ER. She was educated that it is very important that she doesnt push her shoulder. She is still healing. She will also continue with scpaular retraction. Herpassive ER was measured at 12 degrees today. Flexion was measured at 100 degrees.     Personal Factors and Comorbidities  Comorbidity 2    Comorbidities  depression, bipolar     Examination-Activity Limitations  Bathing;Hygiene/Grooming;Reach Overhead;Carry;Dressing;Lift;Caring for Others    Examination-Participation Restrictions  Cleaning;Driving;Laundry;Yard Work;Meal Prep    Stability/Clinical Decision Making  Evolving/Moderate complexity    Clinical Decision Making  Moderate    Rehab Potential  Good    PT Frequency  3x / week    PT Duration  12 weeks    PT Treatment/Interventions  ADLs/Self Care Home Management;Electrical Stimulation;Cryotherapy;Canalith Repostioning;Moist Heat;Ultrasound;DME Instruction;Gait training;Functional mobility training;Stair training;Therapeutic activities;Therapeutic exercise;Neuromuscular re-education;Patient/family education;Manual techniques;Passive range of motion;Taping    PT Next Visit Plan  progress PROM for shoulder, okay for elbow/wrist AROM, not sure about pulleys     PT Home Exercise Plan  pendulum; towel squeeze; elbow pronation/supination, self PROM supine, tableslide PROM (sliding body away opposed to sliding arm fwd)    Consulted and Agree with Plan of Care  Patient       Patient will benefit from skilled therapeutic intervention in order to improve the following deficits and impairments:  Pain, Decreased activity tolerance, Decreased endurance, Decreased strength, Decreased range of motion, Impaired UE  functional use, Decreased knowledge of precautions, Decreased safety awareness  Visit Diagnosis: Acute pain of right shoulder  Stiffness of right shoulder, not elsewhere classified  Muscle weakness (generalized)     Problem List Patient Active Problem List   Diagnosis Date Noted  . Post-op pain 12/27/2016  . OSA (obstructive sleep apnea) 08/25/2014    Carney Living PT DPT  02/05/2019, 12:08 PM  [facility] [address] [place], [place], [address] Phone: [phone]   Fax:  [phone]  Name: Kimberly Haynes MRN: [id number] Date of Birth: 09/09/1961

## 2019-02-05 NOTE — Therapy (Signed)
[place], [place], [address] Phone: [phone]   Fax:  [phone]  Physical Therapy Treatment  Patient Details  Name: Kimberly Haynes MRN: [id number] Date of Birth: [birth year] Referring Provider (PT): Dr Ophelia Charter    Encounter Date: 02/04/2019  PT End of Session - 02/05/19 1020    Visit Number  6    Number of Visits  24    Date for PT Re-Evaluation  04/17/19    PT Start Time  1100    PT Stop Time  1140    PT Time Calculation (min)  40 min    Activity Tolerance  Patient tolerated treatment well    Behavior During Therapy  [facility] for tasks assessed/performed       Past Medical History:  Diagnosis Date  . Bipolar disorder ([place])   . Depression   . Hearing impairment   . Sleep apnea    uses dental appliance    Past Surgical History:  Procedure Laterality Date  . APPENDECTOMY    . 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 ORIF HUMERUS FRACTURE Right 01/12/2019   Procedure: OPEN REDUCTION INTERNAL FIXATION (ORIF) PROXIMAL HUMERUS FRACTURE;  Surgeon: Hiram Gash, MD;  Location: [place];  Service: Orthopedics;  Laterality: Right;  . RADIOACTIVE SEED GUIDED EXCISIONAL BREAST BIOPSY Right 11/07/2016   Procedure: RADIOACTIVE SEED GUIDED EXCISIONAL BREAST BIOPSY;  Surgeon: Rolm Bookbinder, MD;  Location: [place];  Service: General;  Laterality: Right;  . TUBAL LIGATION      There were no vitals filed for this visit.  Subjective Assessment - 02/04/19 1106    Subjective  Patient reports that her shoulder  did not get too sore after the last visit. She reports about a 2/10 today.     Pertinent History  depression, bipolar     Limitations  Lifting;Writing;House hold activities    Diagnostic tests  nothing post op     Patient Stated Goals  to have functional use  of her right UE     Currently in Pain?  Yes    Pain Score  2     Pain Location  Shoulder    Pain Orientation  Right    Pain Descriptors / Indicators  Aching    Pain Type  Surgical pain    Pain Onset  More than a month ago    Pain Frequency  Constant    Aggravating Factors   certain movements     Pain Relieving Factors  rest     Effect of Pain on Daily Activities  unable to use her dominant arm                        [facility] Adult PT Treatment/Exercise - 02/05/19 1158      Shoulder Exercises: Supine   Other Supine Exercises  wand ER 2x10       Shoulder Exercises: Seated   Other Seated Exercises  table slides 2x10     Other Seated Exercises  scap retraction x20       Shoulder Exercises: Pulleys   Flexion Limitations  2 min       Manual Therapy   Joint Mobilization  Grade 1 and 2 inferior and posterior glides.     Soft tissue mobilization  to right upper trap and  posterior shoulder     Passive ROM  flexion, abduction , ER             PT Education - 02/04/19 1107    Education Details  reviewed HEP and symptom mangement     Person(s) Educated  Patient    Methods  Explanation;Demonstration;Tactile cues;Verbal cues;Handout    Comprehension  Verbalized understanding;Verbal cues required;Tactile cues required;Returned demonstration       PT Short Term Goals - 01/27/19 1331      PT SHORT TERM GOAL #1   Title  Patient will demonstrate passive shoulder flexion to 60 degrees     Baseline  per visual inspection reached.     Time  4    Period  Weeks    Status  On-going      PT SHORT TERM GOAL #2   Title  Patient will begin AAROM to the right shoulder ( per MD guidlines)    Time  4    Period  Weeks    Status  On-going      PT SHORT TERM GOAL #3   Title  Patient will demonstrate 30 degrees of passive ROM     Time  4    Period  Weeks    Status  On-going        PT Long Term Goals - 01/23/19 1359      PT LONG TERM GOAL #1   Title  Patient will reach  behind her head with right hand to brush her hair     Time  12    Period  Weeks    Status  New    Target Date  04/17/19      PT LONG TERM GOAL #2   Title  Patient will use her right arm to feed herself     Time  12    Period  Weeks    Status  New    Target Date  04/17/19      PT LONG TERM GOAL #3   Title  Patient will tuck her shirt in behind her without increased pain with her right arm     Time  12    Period  Weeks    Status  New    Target Date  04/17/19            Plan - 02/05/19 1020    Clinical Impression Statement  Therapy added pulleys per MD. She had no increase in pain. Per visual inspection her motion is improving. She was also given scap retractions sitting. Therapy also reviewed self er with a wand.,     Examination-Activity Limitations  Bathing;Hygiene/Grooming;Reach Overhead;Carry;Dressing;Lift;Caring for Others    Examination-Participation Restrictions  Cleaning;Driving;Laundry;Yard Work;Meal Prep    Stability/Clinical Decision Making  Evolving/Moderate complexity    Clinical Decision Making  Moderate    Rehab Potential  Good    PT Frequency  3x / week    PT Duration  12 weeks    PT Treatment/Interventions  ADLs/Self Care Home Management;Electrical Stimulation;Cryotherapy;Canalith Repostioning;Moist Heat;Ultrasound;DME Instruction;Gait training;Functional mobility training;Stair training;Therapeutic activities;Therapeutic exercise;Neuromuscular re-education;Patient/family education;Manual techniques;Passive range of motion;Taping    PT Next Visit Plan  progress PROM for shoulder, okay for elbow/wrist AROM, not sure about pulleys     PT Home Exercise Plan  pendulum; towel squeeze; elbow pronation/supination, self PROM supine, tableslide PROM (sliding body away opposed to sliding arm fwd)    Consulted and Agree with Plan of Care  Patient       Patient will  benefit from skilled therapeutic intervention in order to improve the following deficits and impairments:   Pain, Decreased activity tolerance, Decreased endurance, Decreased strength, Decreased range of motion, Impaired UE functional use, Decreased knowledge of precautions, Decreased safety awareness  Visit Diagnosis: Acute pain of right shoulder  Stiffness of right shoulder, not elsewhere classified  Muscle weakness (generalized)     Problem List Patient Active Problem List   Diagnosis Date Noted  . Post-op pain 12/27/2016  . OSA (obstructive sleep apnea) 08/25/2014    Carney Living  PT DPT  02/05/2019, 11:59 AM  [facility] [address] [place], [place], [address] Phone: [phone]   Fax:  [phone]  Name: Leiya Keesey MRN: [id number] Date of Birth: 1961-02-22

## 2019-02-10 ENCOUNTER — Ambulatory Visit: Payer: 59 | Admitting: Physical Therapy

## 2019-02-10 ENCOUNTER — Other Ambulatory Visit: Payer: Self-pay

## 2019-02-10 DIAGNOSIS — M25511 Pain in right shoulder: Secondary | ICD-10-CM

## 2019-02-10 DIAGNOSIS — M25611 Stiffness of right shoulder, not elsewhere classified: Secondary | ICD-10-CM | POA: Diagnosis not present

## 2019-02-10 DIAGNOSIS — M6281 Muscle weakness (generalized): Secondary | ICD-10-CM

## 2019-02-10 NOTE — Therapy (Signed)
[place], [place], [address] Phone: [phone]   Fax:  [phone]  Physical Therapy Treatment  Patient Details  Name: Kimberly Haynes MRN: [id number] Date of Birth: [birth year] Referring Provider (PT): Dr Ophelia Charter    Encounter Date: 02/10/2019  PT End of Session - 02/10/19 1104    Visit Number  8    Number of Visits  24    Date for PT Re-Evaluation  04/17/19    Authorization Type  [employer]    PT Start Time  1015    PT Stop Time  1106   last 10 min on ice   PT Time Calculation (min)  51 min    Activity Tolerance  Patient tolerated treatment well       Past Medical History:  Diagnosis Date  . Bipolar disorder ([place])   . Depression   . Hearing impairment   . Sleep apnea    uses dental appliance    Past Surgical History:  Procedure Laterality Date  . APPENDECTOMY    . 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 ORIF HUMERUS FRACTURE Right 01/12/2019   Procedure: OPEN REDUCTION INTERNAL FIXATION (ORIF) PROXIMAL HUMERUS FRACTURE;  Surgeon: Hiram Gash, MD;  Location: [place];  Service: Orthopedics;  Laterality: Right;  . RADIOACTIVE SEED GUIDED EXCISIONAL BREAST BIOPSY Right 11/07/2016   Procedure: RADIOACTIVE SEED GUIDED EXCISIONAL BREAST BIOPSY;  Surgeon: Rolm Bookbinder, MD;  Location: [place];  Service: General;  Laterality: Right;  . TUBAL LIGATION      There were no vitals filed for this visit.  Subjective Assessment - 02/10/19 1024    Subjective  No complaints about her shoulder today    Currently in Pain?  No/denies                       [facility] Adult PT Treatment/Exercise - 02/10/19 0001      Shoulder Exercises: Supine   Flexion  PROM;20 reps   hands clasped   Other Supine Exercises  wand ER 2x10       Shoulder  Exercises: Seated   Other Seated Exercises  table slides X15 ea    Other Seated Exercises  scap retraction x20, gripping green DGrip x50      Shoulder Exercises: Standing   Other Standing Exercises  pendulum 4 way 20 ea with cuing to use her hips and legs to produce movement       Shoulder Exercises: Pulleys   Flexion Limitations  2 min     Scaption  1 minute      Modalities   Modalities  Cryotherapy      Cryotherapy   Number Minutes Cryotherapy  10 Minutes    Cryotherapy Location  Shoulder    Type of Cryotherapy  Ice pack      Manual Therapy   Joint Mobilization  Grade 1 and 2 inferior and posterior glides.     Soft tissue mobilization  Scar massage and STM to anterior shoulder    Passive ROM  flexion, abduction , ER             PT Education - 02/10/19 1104    Education Details  scar massage to perform at home    Person(s) Educated  Patient    Methods  Explanation;Demonstration  Comprehension  Verbalized understanding       PT Short Term Goals - 01/27/19 1331      PT SHORT TERM GOAL #1   Title  Patient will demonstrate passive shoulder flexion to 60 degrees     Baseline  per visual inspection reached.     Time  4    Period  Weeks    Status  On-going      PT SHORT TERM GOAL #2   Title  Patient will begin AAROM to the right shoulder ( per MD guidlines)    Time  4    Period  Weeks    Status  On-going      PT SHORT TERM GOAL #3   Title  Patient will demonstrate 30 degrees of passive ROM     Time  4    Period  Weeks    Status  On-going        PT Long Term Goals - 01/23/19 1359      PT LONG TERM GOAL #1   Title  Patient will reach behind her head with right hand to brush her hair     Time  12    Period  Weeks    Status  New    Target Date  04/17/19      PT LONG TERM GOAL #2   Title  Patient will use her right arm to feed herself     Time  12    Period  Weeks    Status  New    Target Date  04/17/19      PT LONG TERM GOAL #3   Title  Patient  will tuck her shirt in behind her without increased pain with her right arm     Time  12    Period  Weeks    Status  New    Target Date  04/17/19            Plan - 02/10/19 1105    Clinical Impression Statement  Session continued to focus on PROM progression with good tolerance and she continues to make progress in this area. PT will continue to progress this as able.     Rehab Potential  Good    PT Frequency  3x / week    PT Duration  12 weeks    PT Treatment/Interventions  ADLs/Self Care Home Management;Electrical Stimulation;Cryotherapy;Canalith Repostioning;Moist Heat;Ultrasound;DME Instruction;Gait training;Functional mobility training;Stair training;Therapeutic activities;Therapeutic exercise;Neuromuscular re-education;Patient/family education;Manual techniques;Passive range of motion;Taping    PT Next Visit Plan  progress PROM for shoulder, okay for pulleys per MD    PT Home Exercise Plan  pendulum; towel squeeze; elbow pronation/supination, self PROM supine, tableslide PROM (sliding body away opposed to sliding arm fwd)    Consulted and Agree with Plan of Care  Patient       Patient will benefit from skilled therapeutic intervention in order to improve the following deficits and impairments:  Pain, Decreased activity tolerance, Decreased endurance, Decreased strength, Decreased range of motion, Impaired UE functional use, Decreased knowledge of precautions, Decreased safety awareness  Visit Diagnosis: Acute pain of right shoulder  Stiffness of right shoulder, not elsewhere classified  Muscle weakness (generalized)     Problem List Patient Active Problem List   Diagnosis Date Noted  . Post-op pain 12/27/2016  . OSA (obstructive sleep apnea) 08/25/2014    [name] 02/10/2019, 11:07 AM  [facility] [address] [place], [place], [address] Phone: [phone]  Fax:  [phone]  Name: Kimberly Haynes MRN: [id number] Date of Birth: 03/05/1961

## 2019-02-12 ENCOUNTER — Other Ambulatory Visit: Payer: Self-pay

## 2019-02-12 ENCOUNTER — Ambulatory Visit: Payer: 59 | Admitting: Physical Therapy

## 2019-02-12 DIAGNOSIS — M6281 Muscle weakness (generalized): Secondary | ICD-10-CM | POA: Diagnosis not present

## 2019-02-12 DIAGNOSIS — M25511 Pain in right shoulder: Secondary | ICD-10-CM | POA: Diagnosis not present

## 2019-02-12 DIAGNOSIS — M25611 Stiffness of right shoulder, not elsewhere classified: Secondary | ICD-10-CM

## 2019-02-12 NOTE — Therapy (Signed)
[place], [place], [address] Phone: [phone]   Fax:  [phone]  Physical Therapy Treatment  Patient Details  Name: Kimberly Haynes MRN: [id number] Date of Birth: [birth year] Referring Provider (PT): Dr Ophelia Charter    Encounter Date: 02/12/2019  PT End of Session - 02/12/19 1323    Visit Number  9    Number of Visits  24    Date for PT Re-Evaluation  04/17/19    Authorization Type  [employer]    PT Start Time  1230    PT Stop Time  1326   last 10 min on ice   PT Time Calculation (min)  56 min    Activity Tolerance  Patient tolerated treatment well    Behavior During Therapy  [facility] for tasks assessed/performed       Past Medical History:  Diagnosis Date  . Bipolar disorder ([place])   . Depression   . Hearing impairment   . Sleep apnea    uses dental appliance    Past Surgical History:  Procedure Laterality Date  . APPENDECTOMY    . 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 ORIF HUMERUS FRACTURE Right 01/12/2019   Procedure: OPEN REDUCTION INTERNAL FIXATION (ORIF) PROXIMAL HUMERUS FRACTURE;  Surgeon: Hiram Gash, MD;  Location: [place];  Service: Orthopedics;  Laterality: Right;  . RADIOACTIVE SEED GUIDED EXCISIONAL BREAST BIOPSY Right 11/07/2016   Procedure: RADIOACTIVE SEED GUIDED EXCISIONAL BREAST BIOPSY;  Surgeon: Rolm Bookbinder, MD;  Location: [place];  Service: General;  Laterality: Right;  . TUBAL LIGATION      There were no vitals filed for this visit.  Subjective Assessment - 02/12/19 1234    Subjective  Pt relays some pain when got out car to come to appointment but it has resolved now    Currently in Pain?  No/denies                       [facility] Adult PT Treatment/Exercise - 02/12/19 1235      Shoulder Exercises: Supine    Flexion  PROM;20 reps   hands clasped   Other Supine Exercises  wand ER 2x10       Shoulder Exercises: Seated   Other Seated Exercises  table slides X15 ea (did flexion and scaption with small red ball in standing, did ER in sitting no ball, arm on table and leaning fwd)    Other Seated Exercises  scap retraction 2X15, gripping green DGrip x50      Shoulder Exercises: Standing   Other Standing Exercises  pendulum 4 way 20 ea with cuing to use her hips and legs to produce movement       Shoulder Exercises: Pulleys   Flexion Limitations  2 min     Scaption  2 minutes      Modalities   Modalities  Cryotherapy      Cryotherapy   Number Minutes Cryotherapy  10 Minutes    Cryotherapy Location  Shoulder    Type of Cryotherapy  Ice pack      Manual Therapy   Joint Mobilization  Grade 1 and 2 inferior and posterior glides.     Soft tissue mobilization  Scar massage and STM to anterior shoulder    Passive ROM  flexion, abduction , ER  PT Short Term Goals - 01/27/19 1331      PT SHORT TERM GOAL #1   Title  Patient will demonstrate passive shoulder flexion to 60 degrees     Baseline  per visual inspection reached.     Time  4    Period  Weeks    Status  On-going      PT SHORT TERM GOAL #2   Title  Patient will begin AAROM to the right shoulder ( per MD guidlines)    Time  4    Period  Weeks    Status  On-going      PT SHORT TERM GOAL #3   Title  Patient will demonstrate 30 degrees of passive ROM     Time  4    Period  Weeks    Status  On-going        PT Long Term Goals - 01/23/19 1359      PT LONG TERM GOAL #1   Title  Patient will reach behind her head with right hand to brush her hair     Time  12    Period  Weeks    Status  New    Target Date  04/17/19      PT LONG TERM GOAL #2   Title  Patient will use her right arm to feed herself     Time  12    Period  Weeks    Status  New    Target Date  04/17/19      PT LONG TERM GOAL #3    Title  Patient will tuck her shirt in behind her without increased pain with her right arm     Time  12    Period  Weeks    Status  New    Target Date  04/17/19            Plan - 02/12/19 1324    Clinical Impression Statement  Pt continues to make slow but steady progress with her PROM and shows good effort with PT. She still lacks ROM and PT will continue to progress this as able. She will have MD follow up around 02/25/19    Rehab Potential  Good    PT Frequency  3x / week    PT Duration  12 weeks    PT Treatment/Interventions  ADLs/Self Care Home Management;Electrical Stimulation;Cryotherapy;Canalith Repostioning;Moist Heat;Ultrasound;DME Instruction;Gait training;Functional mobility training;Stair training;Therapeutic activities;Therapeutic exercise;Neuromuscular re-education;Patient/family education;Manual techniques;Passive range of motion;Taping    PT Next Visit Plan  progress PROM for shoulder, okay for pulleys per MD    PT Home Exercise Plan  pendulum; towel squeeze; elbow pronation/supination, self PROM supine, tableslide PROM (sliding body away opposed to sliding arm fwd)    Consulted and Agree with Plan of Care  Patient       Patient will benefit from skilled therapeutic intervention in order to improve the following deficits and impairments:     Visit Diagnosis: Acute pain of right shoulder  Stiffness of right shoulder, not elsewhere classified  Muscle weakness (generalized)     Problem List Patient Active Problem List   Diagnosis Date Noted  . Post-op pain 12/27/2016  . OSA (obstructive sleep apnea) 08/25/2014    [name] 02/12/2019, 1:28 PM  [facility] [address] [place], [place], [address] Phone: [phone]   Fax:  [phone]  Name: Kimberly Haynes MRN: [id number] Date of Birth: 1960/12/04

## 2019-02-14 ENCOUNTER — Ambulatory Visit: Payer: 59

## 2019-02-14 ENCOUNTER — Other Ambulatory Visit: Payer: Self-pay

## 2019-02-14 DIAGNOSIS — M6281 Muscle weakness (generalized): Secondary | ICD-10-CM | POA: Diagnosis not present

## 2019-02-14 DIAGNOSIS — M25511 Pain in right shoulder: Secondary | ICD-10-CM | POA: Diagnosis not present

## 2019-02-14 DIAGNOSIS — M25611 Stiffness of right shoulder, not elsewhere classified: Secondary | ICD-10-CM

## 2019-02-14 MED FILL — AMLODIPINE BESYLATE 5 MG TA: 5 | 90 days supply | Qty: 90 | Fill #0

## 2019-02-14 MED FILL — METOPROLOL SUCCINATE ER 100: 100 | 90 days supply | Qty: 90 | Fill #0

## 2019-02-14 MED FILL — SUBVENITE 100 MG TABS: 100 | 90 days supply | Qty: 90 | Fill #0

## 2019-02-14 MED FILL — LITHIUM CARBONATE 150 MG CA: 150 | 90 days supply | Qty: 90 | Fill #0

## 2019-02-14 NOTE — Therapy (Signed)
[place], [place], [address] Phone: [phone]   Fax:  [phone]  Physical Therapy Treatment  Patient Details  Name: Kimberly Haynes MRN: [id number] Date of Birth: 06-27-1961 Referring Provider (PT): Dr Ophelia Charter    Encounter Date: 02/14/2019  PT End of Session - 02/14/19 1059    Visit Number  10    Number of Visits  24    Date for PT Re-Evaluation  04/17/19    Authorization Type  [employer]    PT Start Time  1100    PT Stop Time  1200    PT Time Calculation (min)  60 min    Activity Tolerance  Patient tolerated treatment well    Behavior During Therapy  [facility] for tasks assessed/performed       Past Medical History:  Diagnosis Date  . Bipolar disorder ([place])   . Depression   . Hearing impairment   . Sleep apnea    uses dental appliance    Past Surgical History:  Procedure Laterality Date  . APPENDECTOMY    . 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 ORIF HUMERUS FRACTURE Right 01/12/2019   Procedure: OPEN REDUCTION INTERNAL FIXATION (ORIF) PROXIMAL HUMERUS FRACTURE;  Surgeon: Hiram Gash, MD;  Location: [place];  Service: Orthopedics;  Laterality: Right;  . RADIOACTIVE SEED GUIDED EXCISIONAL BREAST BIOPSY Right 11/07/2016   Procedure: RADIOACTIVE SEED GUIDED EXCISIONAL BREAST BIOPSY;  Surgeon: Rolm Bookbinder, MD;  Location: [place];  Service: General;  Laterality: Right;  . TUBAL LIGATION      There were no vitals filed for this visit.  Subjective Assessment - 02/14/19 1100    Subjective  No significant pain today.     Currently in Pain?  No/denies                       [facility] Adult PT Treatment/Exercise - 02/14/19 0001      Shoulder Exercises: Supine   External Rotation  Right;20 reps    External Rotation Limitations   dowel    Flexion  PROM;20 reps   hands clasped   Other Supine Exercises  wand ER 2x10       Shoulder Exercises: Seated   Other Seated Exercises  table slides X20 ea (did flexion and scaption with small red ball in standing, ,    Other Seated Exercises  scap retraction 2X15, gripping green DGrip x50      Shoulder Exercises: Standing   Other Standing Exercises  pendulum 4 way 20 ea with cuing to use her hips and legs to produce movement       Shoulder Exercises: Pulleys   Flexion  2 minutes    Scaption  2 minutes      Cryotherapy   Number Minutes Cryotherapy  10 Minutes    Cryotherapy Location  Shoulder    Type of Cryotherapy  Ice pack      Manual Therapy   Joint Mobilization  Grade 1 and 2 inferior and posterior glides.     Soft tissue mobilization  Scar massage and STM to anterior shoulder    Passive ROM  flexion, abduction , ER               PT Short Term Goals - 01/27/19 1331      PT  SHORT TERM GOAL #1   Title  Patient will demonstrate passive shoulder flexion to 60 degrees     Baseline  per visual inspection reached.     Time  4    Period  Weeks    Status  On-going      PT SHORT TERM GOAL #2   Title  Patient will begin AAROM to the right shoulder ( per MD guidlines)    Time  4    Period  Weeks    Status  On-going      PT SHORT TERM GOAL #3   Title  Patient will demonstrate 30 degrees of passive ROM     Time  4    Period  Weeks    Status  On-going        PT Long Term Goals - 01/23/19 1359      PT LONG TERM GOAL #1   Title  Patient will reach behind her head with right hand to brush her hair     Time  12    Period  Weeks    Status  New    Target Date  04/17/19      PT LONG TERM GOAL #2   Title  Patient will use her right arm to feed herself     Time  12    Period  Weeks    Status  New    Target Date  04/17/19      PT LONG TERM GOAL #3   Title  Patient will tuck her shirt in behind her without increased pain with her right arm     Time  12     Period  Weeks    Status  New    Target Date  04/17/19            Plan - 02/14/19 1059    Clinical Impression Statement  Mild soreness post session eases with cold. Good tolerance with strethcing though I went easy to not incr pain.   Continue to tolerance  with ROM     PT Treatment/Interventions  ADLs/Self Care Home Management;Electrical Stimulation;Cryotherapy;Canalith Repostioning;Moist Heat;Ultrasound;DME Instruction;Gait training;Functional mobility training;Stair training;Therapeutic activities;Therapeutic exercise;Neuromuscular re-education;Patient/family education;Manual techniques;Passive range of motion;Taping    PT Next Visit Plan  progress PROM for shoulder, okay for pulleys per MD    PT Home Exercise Plan  pendulum; towel squeeze; elbow pronation/supination, self PROM supine, tableslide PROM (sliding body away opposed to sliding arm fwd)    Consulted and Agree with Plan of Care  Patient       Patient will benefit from skilled therapeutic intervention in order to improve the following deficits and impairments:  Pain, Decreased activity tolerance, Decreased endurance, Decreased strength, Decreased range of motion, Impaired UE functional use, Decreased knowledge of precautions, Decreased safety awareness  Visit Diagnosis: Acute pain of right shoulder  Stiffness of right shoulder, not elsewhere classified  Muscle weakness (generalized)     Problem List Patient Active Problem List   Diagnosis Date Noted  . Post-op pain 12/27/2016  . OSA (obstructive sleep apnea) 08/25/2014    Kimberly Haynes  PT 02/14/2019, 11:47 AM  [facility] [address] [place], [place], [address] Phone: [phone]   Fax:  [phone]  Name: Kimberly Haynes MRN: [id number] Date of Birth: August 02, 1961

## 2019-02-18 ENCOUNTER — Ambulatory Visit: Payer: 59 | Admitting: Physical Therapy

## 2019-02-18 ENCOUNTER — Other Ambulatory Visit: Payer: Self-pay

## 2019-02-18 ENCOUNTER — Encounter: Payer: Self-pay | Admitting: Physical Therapy

## 2019-02-18 DIAGNOSIS — M6281 Muscle weakness (generalized): Secondary | ICD-10-CM

## 2019-02-18 DIAGNOSIS — M25611 Stiffness of right shoulder, not elsewhere classified: Secondary | ICD-10-CM

## 2019-02-18 DIAGNOSIS — M25511 Pain in right shoulder: Secondary | ICD-10-CM

## 2019-02-18 NOTE — Therapy (Signed)
[place], [place], [address] Phone: [phone]   Fax:  [phone]  Physical Therapy Treatment  Patient Details  Name: Kimberly Haynes MRN: [id number] Date of Birth: 11/03/1960 Referring Provider (PT): Dr Ophelia Charter    Encounter Date: 02/18/2019  PT End of Session - 02/18/19 1241    Visit Number  11    Number of Visits  24    Date for PT Re-Evaluation  04/17/19    Authorization Type  [employer]    PT Start Time  1147    PT Stop Time  1238    PT Time Calculation (min)  51 min    Activity Tolerance  Patient tolerated treatment well    Behavior During Therapy  [facility] for tasks assessed/performed       Past Medical History:  Diagnosis Date  . Bipolar disorder ([place])   . Depression   . Hearing impairment   . Sleep apnea    uses dental appliance    Past Surgical History:  Procedure Laterality Date  . APPENDECTOMY    . 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 ORIF HUMERUS FRACTURE Right 01/12/2019   Procedure: OPEN REDUCTION INTERNAL FIXATION (ORIF) PROXIMAL HUMERUS FRACTURE;  Surgeon: Hiram Gash, MD;  Location: [place];  Service: Orthopedics;  Laterality: Right;  . RADIOACTIVE SEED GUIDED EXCISIONAL BREAST BIOPSY Right 11/07/2016   Procedure: RADIOACTIVE SEED GUIDED EXCISIONAL BREAST BIOPSY;  Surgeon: Rolm Bookbinder, MD;  Location: [place];  Service: General;  Laterality: Right;  . TUBAL LIGATION      There were no vitals filed for this visit.  Subjective Assessment - 02/18/19 1155    Subjective  Patient is sore this morning abut she reports it always is when she first starts off. Her pain level is ablut a 4-5/10. She had no significant increase in pain after her last visit.     Limitations  Lifting;Writing;House hold activities    Diagnostic tests   nothing post op     Patient Stated Goals  to have functional use of her right UE     Currently in Pain?  Yes    Pain Score  5     Pain Location  Shoulder    Pain Orientation  Right    Pain Descriptors / Indicators  Aching    Pain Type  Chronic pain    Pain Onset  More than a month ago                       [facility] Adult PT Treatment/Exercise - 02/18/19 0001      Shoulder Exercises: Supine   External Rotation  Right;20 reps    External Rotation Limitations  dowel      Shoulder Exercises: Seated   Other Seated Exercises  scap retraction 2x20; Seated rreach with ranger on the ground x20; horizontal abduction/ addution in pain free range with rnager.       Shoulder Exercises: Pulleys   Flexion  2 minutes    Scaption  2 minutes      Cryotherapy   Number Minutes Cryotherapy  10 Minutes    Cryotherapy Location  Shoulder    Type of Cryotherapy  Ice pack      Manual Therapy   Joint Mobilization  Grade 1 and 2 inferior and  posterior glides.     Soft tissue mobilization  Scar massage and STM to anterior shoulder    Passive ROM  flexion, abduction , ER             PT Education - 02/18/19 1241    Education Details  reviewed gentle slef ER stretching.     Person(s) Educated  Patient    Methods  Explanation;Demonstration;Tactile cues;Verbal cues    Comprehension  Verbalized understanding;Returned demonstration;Tactile cues required;Verbal cues required       PT Short Term Goals - 01/27/19 1331      PT SHORT TERM GOAL #1   Title  Patient will demonstrate passive shoulder flexion to 60 degrees     Baseline  per visual inspection reached.     Time  4    Period  Weeks    Status  On-going      PT SHORT TERM GOAL #2   Title  Patient will begin AAROM to the right shoulder ( per MD guidlines)    Time  4    Period  Weeks    Status  On-going      PT SHORT TERM GOAL #3   Title  Patient will demonstrate 30 degrees of passive ROM     Time  4    Period  Weeks     Status  On-going        PT Long Term Goals - 01/23/19 1359      PT LONG TERM GOAL #1   Title  Patient will reach behind her head with right hand to brush her hair     Time  12    Period  Weeks    Status  New    Target Date  04/17/19      PT LONG TERM GOAL #2   Title  Patient will use her right arm to feed herself     Time  12    Period  Weeks    Status  New    Target Date  04/17/19      PT LONG TERM GOAL #3   Title  Patient will tuck her shirt in behind her without increased pain with her right arm     Time  12    Period  Weeks    Status  New    Target Date  04/17/19            Plan - 02/18/19 1243    Clinical Impression Statement  Per vidsual inspection the patients flexion is improving. Her ER appears to be about the same. She was encouraged to continue ER stretching at home using her wand. She sees the MD on the 28th.     Examination-Activity Limitations  Bathing;Hygiene/Grooming;Reach Overhead;Carry;Dressing;Lift;Caring for Others    Examination-Participation Restrictions  Cleaning;Driving;Laundry;Yard Work;Meal Prep    Stability/Clinical Decision Making  Evolving/Moderate complexity    Rehab Potential  Good    PT Frequency  3x / week    PT Duration  12 weeks    PT Treatment/Interventions  ADLs/Self Care Home Management;Electrical Stimulation;Cryotherapy;Canalith Repostioning;Moist Heat;Ultrasound;DME Instruction;Gait training;Functional mobility training;Stair training;Therapeutic activities;Therapeutic exercise;Neuromuscular re-education;Patient/family education;Manual techniques;Passive range of motion;Taping    PT Next Visit Plan  progress PROM for shoulder, okay for pulleys per MD    PT Home Exercise Plan  pendulum; towel squeeze; elbow pronation/supination, self PROM supine, tableslide PROM (sliding body away opposed to sliding arm fwd)       Patient will benefit from skilled therapeutic intervention in order to  improve the following deficits and  impairments:  Pain, Decreased activity tolerance, Decreased endurance, Decreased strength, Decreased range of motion, Impaired UE functional use, Decreased knowledge of precautions, Decreased safety awareness  Visit Diagnosis: Acute pain of right shoulder  Stiffness of right shoulder, not elsewhere classified  Muscle weakness (generalized)     Problem List Patient Active Problem List   Diagnosis Date Noted  . Post-op pain 12/27/2016  . OSA (obstructive sleep apnea) 08/25/2014    Carney Living PT DPT  02/18/2019, 2:40 PM  [facility] [address] [place], [place], [address] Phone: [phone]   Fax:  [phone]  Name: Kimberly Haynes MRN: [id number] Date of Birth: 08/01/1961

## 2019-02-20 ENCOUNTER — Encounter: Payer: Self-pay | Admitting: Physical Therapy

## 2019-02-20 ENCOUNTER — Other Ambulatory Visit: Payer: Self-pay

## 2019-02-20 ENCOUNTER — Ambulatory Visit: Payer: 59 | Admitting: Physical Therapy

## 2019-02-20 DIAGNOSIS — M25611 Stiffness of right shoulder, not elsewhere classified: Secondary | ICD-10-CM | POA: Diagnosis not present

## 2019-02-20 DIAGNOSIS — M6281 Muscle weakness (generalized): Secondary | ICD-10-CM

## 2019-02-20 DIAGNOSIS — M25511 Pain in right shoulder: Secondary | ICD-10-CM | POA: Diagnosis not present

## 2019-02-20 NOTE — Therapy (Addendum)
[place], [place], [address] Phone: [phone]   Fax:  [phone]  Physical Therapy Treatment/Progress Note   Patient Details  Name: Kimberly Haynes MRN: [id number] Date of Birth: 1960-11-03 Referring Provider (PT): Dr Ophelia Charter    Encounter Date: 02/20/2019  PT End of Session - 02/20/19 1025    Visit Number  12    Number of Visits  24    Date for PT Re-Evaluation  04/17/19    Authorization Type  [employer]    PT Start Time  1018    PT Stop Time  1110    PT Time Calculation (min)  52 min    Activity Tolerance  Patient tolerated treatment well    Behavior During Therapy  [facility] for tasks assessed/performed       Past Medical History:  Diagnosis Date  . Bipolar disorder ([place])   . Depression   . Hearing impairment   . Sleep apnea    uses dental appliance    Past Surgical History:  Procedure Laterality Date  . APPENDECTOMY    . 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 ORIF HUMERUS FRACTURE Right 01/12/2019   Procedure: OPEN REDUCTION INTERNAL FIXATION (ORIF) PROXIMAL HUMERUS FRACTURE;  Surgeon: Hiram Gash, MD;  Location: [place];  Service: Orthopedics;  Laterality: Right;  . RADIOACTIVE SEED GUIDED EXCISIONAL BREAST BIOPSY Right 11/07/2016   Procedure: RADIOACTIVE SEED GUIDED EXCISIONAL BREAST BIOPSY;  Surgeon: Rolm Bookbinder, MD;  Location: [place];  Service: General;  Laterality: Right;  . TUBAL LIGATION      There were no vitals filed for this visit.  Subjective Assessment - 02/20/19 1023    Subjective  Patient reports no increase in soreness after the last visit. She is having very little pain this morning.     Pertinent History  depression, bipolar     Limitations  Lifting;Writing;House hold activities    Diagnostic tests  nothing post op      Patient Stated Goals  to have functional use of her right UE     Currently in Pain?  Yes    Pain Score  2     Pain Location  Shoulder    Pain Orientation  Right    Pain Descriptors / Indicators  Aching    Pain Type  Chronic pain    Pain Onset  More than a month ago    Pain Frequency  Constant    Aggravating Factors   certain movement    Pain Relieving Factors  rest    Effect of Pain on Daily Activities  unabke to use dominant arm          [facility] PT Assessment - 02/20/19 0001      PROM   Left Shoulder Flexion  96 Degrees    Left Shoulder ABduction  78 Degrees    Left Shoulder External Rotation  17 Degrees                   [facility] Adult PT Treatment/Exercise - 02/20/19 0001      Self-Care   Self-Care  Other Self-Care Comments    Other Self-Care Comments   reviewed importance of putty for the hand and wrist strengthening. She feels like her wrist and hand are weak.       Shoulder Exercises: Supine  External Rotation  Right;20 reps;10 reps    External Rotation Limitations  dowel 5 sec hold at end range       Shoulder Exercises: Seated   Other Seated Exercises  scap retraction 2x20; Seated rreach with ranger on the ground x20; horizontal abduction/ addution in pain free range with rnager.       Shoulder Exercises: Pulleys   Flexion  2 minutes    Scaption  2 minutes      Cryotherapy   Number Minutes Cryotherapy  10 Minutes    Cryotherapy Location  Shoulder    Type of Cryotherapy  Ice pack      Manual Therapy   Joint Mobilization  Grade 1 and 2 inferior and posterior glides.     Soft tissue mobilization  Scar massage and STM to anterior shoulder; trigger point release o upper trap    Passive ROM  flexion, abduction , ER             PT Education - 02/20/19 1024    Education Details  reviewed self stretching at home     Person(s) Educated  Patient    Methods  Explanation;Demonstration;Tactile cues;Verbal cues    Comprehension  Verbalized  understanding;Returned demonstration;Verbal cues required;Tactile cues required       PT Short Term Goals - 02/20/19 1221      PT SHORT TERM GOAL #1   Title  Patient will demonstrate passive shoulder flexion to 60 degrees     Baseline  96    Time  4    Period  Weeks    Status  Achieved    Target Date  02/20/19      PT SHORT TERM GOAL #2   Title  Patient will begin AAROM to the right shoulder ( per MD guidlines)    Baseline  will go to the MD next week     Time  4    Period  Weeks    Status  On-going    Target Date  02/20/19      PT SHORT TERM GOAL #3   Title  Patient will demonstrate 30 degrees of passive ROM     Baseline  17 degrees     Time  4    Period  Weeks    Status  On-going        PT Long Term Goals - 01/23/19 1359      PT LONG TERM GOAL #1   Title  Patient will reach behind her head with right hand to brush her hair     Time  12    Period  Weeks    Status  New    Target Date  04/17/19      PT LONG TERM GOAL #2   Title  Patient will use her right arm to feed herself     Time  12    Period  Weeks    Status  New    Target Date  04/17/19      PT LONG TERM GOAL #3   Title  Patient will tuck her shirt in behind her without increased pain with her right arm     Time  12    Period  Weeks    Status  New    Target Date  04/17/19            Plan - 02/20/19 1144    Clinical Impression Statement  Patients ROM has improved in all planes. Her flexion and abduction  are progressing well. Her ER is still limited. She has progressed from 0-17 degrees. Her flexion was measured at 96. Her pain has been well controlled. Therapy will proceed with functional strengthening per MD recommendations.      Comorbidities  depression, bipolar     Examination-Activity Limitations  Bathing;Hygiene/Grooming;Reach Overhead;Carry;Dressing;Lift;Caring for Others    Examination-Participation Restrictions  Cleaning;Driving;Laundry;Yard Work;Meal Prep    Stability/Clinical Decision  Making  Evolving/Moderate complexity    Clinical Decision Making  Moderate    Rehab Potential  Good    PT Frequency  3x / week    PT Duration  12 weeks    PT Treatment/Interventions  ADLs/Self Care Home Management;Electrical Stimulation;Cryotherapy;Canalith Repostioning;Moist Heat;Ultrasound;DME Instruction;Gait training;Functional mobility training;Stair training;Therapeutic activities;Therapeutic exercise;Neuromuscular re-education;Patient/family education;Manual techniques;Passive range of motion;Taping    PT Next Visit Plan  progress PROM for shoulder, okay for pulleys per MD    PT Home Exercise Plan  pendulum; towel squeeze; elbow pronation/supination, self PROM supine, tableslide PROM (sliding body away opposed to sliding arm fwd)    Consulted and Agree with Plan of Care  Patient       Patient will benefit from skilled therapeutic intervention in order to improve the following deficits and impairments:  Pain, Decreased activity tolerance, Decreased endurance, Decreased strength, Decreased range of motion, Impaired UE functional use, Decreased knowledge of precautions, Decreased safety awareness  Visit Diagnosis: Acute pain of right shoulder  Stiffness of right shoulder, not elsewhere classified  Muscle weakness (generalized)     Problem List Patient Active Problem List   Diagnosis Date Noted  . Post-op pain 12/27/2016  . OSA (obstructive sleep apnea) 08/25/2014    Carney Living PT DPT  02/20/2019, 1:51 PM  [facility] [address] [place], [place], [address] Phone: [phone]   Fax:  [phone]  Name: Kimberly Haynes MRN: [id number] Date of Birth: [birth year]

## 2019-02-21 ENCOUNTER — Ambulatory Visit: Payer: 59 | Admitting: Physical Therapy

## 2019-02-21 DIAGNOSIS — M25611 Stiffness of right shoulder, not elsewhere classified: Secondary | ICD-10-CM | POA: Diagnosis not present

## 2019-02-21 DIAGNOSIS — M6281 Muscle weakness (generalized): Secondary | ICD-10-CM | POA: Diagnosis not present

## 2019-02-21 DIAGNOSIS — M25511 Pain in right shoulder: Secondary | ICD-10-CM | POA: Diagnosis not present

## 2019-02-21 NOTE — Therapy (Signed)
[place], [place], [address] Phone: [phone]   Fax:  [phone]  Physical Therapy Treatment  Patient Details  Name: Kimberly Haynes MRN: [id number] Date of Birth: October 10, 1961 Referring Provider (PT): Dr Ophelia Charter    Encounter Date: 02/21/2019  PT End of Session - 02/21/19 1234    Visit Number  13    Number of Visits  24    Date for PT Re-Evaluation  04/17/19    Authorization Type  [employer]    PT Start Time  1015    PT Stop Time  1110   last 10 min on ice   PT Time Calculation (min)  55 min    Activity Tolerance  Patient tolerated treatment well    Behavior During Therapy  [facility] for tasks assessed/performed       Past Medical History:  Diagnosis Date  . Bipolar disorder ([place])   . Depression   . Hearing impairment   . Sleep apnea    uses dental appliance    Past Surgical History:  Procedure Laterality Date  . APPENDECTOMY    . 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 ORIF HUMERUS FRACTURE Right 01/12/2019   Procedure: OPEN REDUCTION INTERNAL FIXATION (ORIF) PROXIMAL HUMERUS FRACTURE;  Surgeon: Hiram Gash, MD;  Location: [place];  Service: Orthopedics;  Laterality: Right;  . RADIOACTIVE SEED GUIDED EXCISIONAL BREAST BIOPSY Right 11/07/2016   Procedure: RADIOACTIVE SEED GUIDED EXCISIONAL BREAST BIOPSY;  Surgeon: Rolm Bookbinder, MD;  Location: [place];  Service: General;  Laterality: Right;  . TUBAL LIGATION      There were no vitals filed for this visit.  Subjective Assessment - 02/21/19 1126    Subjective  Patient reports no increase in soreness after the last visit. She is not reporting pain upon arrival    Pertinent History  depression, bipolar     Currently in Pain?  No/denies                       [facility] Adult PT  Treatment/Exercise - 02/21/19 0001      Shoulder Exercises: Supine   External Rotation  Right;20 reps;10 reps    External Rotation Limitations  dowel 5 sec hold at end range     Flexion  PROM;20 reps   hands clasped     Shoulder Exercises: Seated   Other Seated Exercises  scap retraction 2x20; gripping green Dgrip 2X25      Shoulder Exercises: Standing   Other Standing Exercises  pendulum 4 way 20 ea    Other Standing Exercises  tableslides for AAROM/PROM flexion (arm on small red ball tall table), scaption and ER (arm on small red ball on lower mat table slightly raised) all with cues to stay in pain free ROM for 15 reps      Shoulder Exercises: Pulleys   Flexion  2 minutes    Scaption  2 minutes      Cryotherapy   Number Minutes Cryotherapy  10 Minutes    Cryotherapy Location  Shoulder    Type of Cryotherapy  Ice pack      Manual Therapy   Joint Mobilization  Grade 1 and 2 inferior and posterior glides.     Soft tissue mobilization  Scar massage and STM to anterior shoulder; trigger point release  o upper trap    Passive ROM  flexion, abduction , ER, scaption, elbow extension               PT Short Term Goals - 02/20/19 1221      PT SHORT TERM GOAL #1   Title  Patient will demonstrate passive shoulder flexion to 60 degrees     Baseline  96    Time  4    Period  Weeks    Status  Achieved    Target Date  02/20/19      PT SHORT TERM GOAL #2   Title  Patient will begin AAROM to the right shoulder ( per MD guidlines)    Baseline  will go to the MD next week     Time  4    Period  Weeks    Status  On-going    Target Date  02/20/19      PT SHORT TERM GOAL #3   Title  Patient will demonstrate 30 degrees of passive ROM     Baseline  17 degrees     Time  4    Period  Weeks    Status  On-going        PT Long Term Goals - 01/23/19 1359      PT LONG TERM GOAL #1   Title  Patient will reach behind her head with right hand to brush her hair     Time  12     Period  Weeks    Status  New    Target Date  04/17/19      PT LONG TERM GOAL #2   Title  Patient will use her right arm to feed herself     Time  12    Period  Weeks    Status  New    Target Date  04/17/19      PT LONG TERM GOAL #3   Title  Patient will tuck her shirt in behind her without increased pain with her right arm     Time  12    Period  Weeks    Status  New    Target Date  04/17/19            Plan - 02/21/19 1235    Clinical Impression Statement  Pt continues to show steady improvments in ROM. She continues to show good motivation and effort with PT. PT will continue to progress as able, she will see MD next week and PT will await further recommendations if it is safe to begin AAROM.     Comorbidities  depression, bipolar     Examination-Activity Limitations  Bathing;Hygiene/Grooming;Reach Overhead;Carry;Dressing;Lift;Caring for Others    Examination-Participation Restrictions  Cleaning;Driving;Laundry;Yard Work;Meal Prep    Stability/Clinical Decision Making  Evolving/Moderate complexity    Rehab Potential  Good    PT Frequency  3x / week    PT Duration  12 weeks    PT Treatment/Interventions  ADLs/Self Care Home Management;Electrical Stimulation;Cryotherapy;Canalith Repostioning;Moist Heat;Ultrasound;DME Instruction;Gait training;Functional mobility training;Stair training;Therapeutic activities;Therapeutic exercise;Neuromuscular re-education;Patient/family education;Manual techniques;Passive range of motion;Taping    PT Next Visit Plan  progress PROM for shoulder, okay for pulleys per MD    PT Home Exercise Plan  pendulum; towel squeeze; elbow pronation/supination, self PROM supine, tableslide PROM (sliding body away opposed to sliding arm fwd)    Consulted and Agree with Plan of Care  Patient       Patient will benefit from skilled therapeutic intervention in order  to improve the following deficits and impairments:  Pain, Decreased activity tolerance, Decreased  endurance, Decreased strength, Decreased range of motion, Impaired UE functional use, Decreased knowledge of precautions, Decreased safety awareness  Visit Diagnosis: Acute pain of right shoulder  Stiffness of right shoulder, not elsewhere classified  Muscle weakness (generalized)     Problem List Patient Active Problem List   Diagnosis Date Noted  . Post-op pain 12/27/2016  . OSA (obstructive sleep apnea) 08/25/2014    [name] 02/21/2019, 12:38 PM  [facility] [address] [place], [place], [address] Phone: [phone]   Fax:  [phone]  Name: Cory Rama MRN: [id number] Date of Birth: 1961-03-20

## 2019-02-24 ENCOUNTER — Other Ambulatory Visit: Payer: Self-pay

## 2019-02-24 ENCOUNTER — Encounter: Payer: Self-pay | Admitting: Physical Therapy

## 2019-02-24 ENCOUNTER — Ambulatory Visit: Payer: 59 | Admitting: Physical Therapy

## 2019-02-24 DIAGNOSIS — M6281 Muscle weakness (generalized): Secondary | ICD-10-CM | POA: Diagnosis not present

## 2019-02-24 DIAGNOSIS — M25611 Stiffness of right shoulder, not elsewhere classified: Secondary | ICD-10-CM | POA: Diagnosis not present

## 2019-02-24 DIAGNOSIS — M25511 Pain in right shoulder: Secondary | ICD-10-CM | POA: Diagnosis not present

## 2019-02-24 MED FILL — ZALEPLON 10 MG CAPS: 10 | 75 days supply | Qty: 150 | Fill #1

## 2019-02-24 NOTE — Therapy (Signed)
[place], [place], [address] Phone: [phone]   Fax:  [phone]  Physical Therapy Treatment  Patient Details  Name: Kimberly Haynes MRN: [id number] Date of Birth: 12/31/1960 Referring Provider (PT): Dr Ophelia Charter    Encounter Date: 02/24/2019  PT End of Session - 02/24/19 1149    Visit Number  14    Number of Visits  24    Date for PT Re-Evaluation  04/17/19    Authorization Type  [employer]    PT Start Time  1145    PT Stop Time  1234    PT Time Calculation (min)  49 min    Activity Tolerance  Patient tolerated treatment well    Behavior During Therapy  [facility] for tasks assessed/performed       Past Medical History:  Diagnosis Date  . Bipolar disorder ([place])   . Depression   . Hearing impairment   . Sleep apnea    uses dental appliance    Past Surgical History:  Procedure Laterality Date  . APPENDECTOMY    . 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 ORIF HUMERUS FRACTURE Right 01/12/2019   Procedure: OPEN REDUCTION INTERNAL FIXATION (ORIF) PROXIMAL HUMERUS FRACTURE;  Surgeon: Hiram Gash, MD;  Location: [place];  Service: Orthopedics;  Laterality: Right;  . RADIOACTIVE SEED GUIDED EXCISIONAL BREAST BIOPSY Right 11/07/2016   Procedure: RADIOACTIVE SEED GUIDED EXCISIONAL BREAST BIOPSY;  Surgeon: Rolm Bookbinder, MD;  Location: [place];  Service: General;  Laterality: Right;  . TUBAL LIGATION      There were no vitals filed for this visit.  Subjective Assessment - 02/24/19 1149    Subjective  "I am doing okay, I may have been using the R arm alittle with reaching but I don't do alot"    Patient Stated Goals  to have functional use of her right UE     Currently in Pain?  No/denies    Pain Score  0-No pain    Pain Orientation  Right    Pain  Descriptors / Indicators  Aching    Pain Type  Chronic pain    Pain Onset  More than a month ago    Pain Frequency  Intermittent    Aggravating Factors   certain movements    Pain Relieving Factors  rest         [facility] PT Assessment - 02/24/19 0001      Assessment   Medical Diagnosis  Right shoulder Fx/ORIF    Referring Provider (PT)  Dr Ophelia Charter     Onset Date/Surgical Date  01/12/19    Next MD Visit  02/25/2019      AROM   Overall AROM Comments  not tested       PROM   Left Shoulder Flexion  93 Degrees   100 end of session   Left Shoulder ABduction  78 Degrees   90 degrees following treatment   Left Shoulder External Rotation  17 Degrees                   [facility] Adult PT Treatment/Exercise - 02/24/19 0001      Shoulder Exercises: Seated   Other Seated Exercises  scap retraction 2x20; gripping green Dgrip 2X25      Shoulder Exercises: Pulleys   Flexion  2  minutes   at end of session   Scaption  2 minutes   at end of session     Cryotherapy   Number Minutes Cryotherapy  10 Minutes    Cryotherapy Location  Shoulder    Type of Cryotherapy  Ice pack   in sitting     Manual Therapy   Manual therapy comments  MTPR along middle deltoid and upper trap    Joint Mobilization  Grade 1 and 2 inferior and posterior glides.     Soft tissue mobilization  Scar massage and STM to anterior shoulder    Passive ROM  flexion, abduction , ER, scaption, elbow extension with gentle distraction oscillations to promote pain relief               PT Short Term Goals - 02/20/19 1221      PT SHORT TERM GOAL #1   Title  Patient will demonstrate passive shoulder flexion to 60 degrees     Baseline  96    Time  4    Period  Weeks    Status  Achieved    Target Date  02/20/19      PT SHORT TERM GOAL #2   Title  Patient will begin AAROM to the right shoulder ( per MD guidlines)    Baseline  will go to the MD next week     Time  4    Period  Weeks    Status   On-going    Target Date  02/20/19      PT SHORT TERM GOAL #3   Title  Patient will demonstrate 30 degrees of passive ROM     Baseline  17 degrees     Time  4    Period  Weeks    Status  On-going        PT Long Term Goals - 01/23/19 1359      PT LONG TERM GOAL #1   Title  Patient will reach behind her head with right hand to brush her hair     Time  12    Period  Weeks    Status  New    Target Date  04/17/19      PT LONG TERM GOAL #2   Title  Patient will use her right arm to feed herself     Time  12    Period  Weeks    Status  New    Target Date  04/17/19      PT LONG TERM GOAL #3   Title  Patient will tuck her shirt in behind her without increased pain with her right arm     Time  12    Period  Weeks    Status  New    Target Date  04/17/19            Plan - 02/24/19 1228    Clinical Impression Statement  pt is making progress with PROM, avoiding AROM / strengthening per MD restrictions. Focused session on PROM and STW to promote shoulder ROM. Pt reports she plans to see MD tomorrow. continued Ice at end of session. shge would benefit from continued physical therapy and plan to progress as restrictions allow.     PT Treatment/Interventions  ADLs/Self Care Home Management;Electrical Stimulation;Cryotherapy;Canalith Repostioning;Moist Heat;Ultrasound;DME Instruction;Gait training;Functional mobility training;Stair training;Therapeutic activities;Therapeutic exercise;Neuromuscular re-education;Patient/family education;Manual techniques;Passive range of motion;Taping    PT Next Visit Plan  progress PROM for shoulder, okay for pulleys per MD, how  was MD's visit    PT Home Exercise Plan  pendulum; towel squeeze; elbow pronation/supination, self PROM supine, tableslide PROM (sliding body away opposed to sliding arm fwd)    Consulted and Agree with Plan of Care  Patient       Patient will benefit from skilled therapeutic intervention in order to improve the following  deficits and impairments:  Pain, Decreased activity tolerance, Decreased endurance, Decreased strength, Decreased range of motion, Impaired UE functional use, Decreased knowledge of precautions, Decreased safety awareness  Visit Diagnosis: Acute pain of right shoulder  Stiffness of right shoulder, not elsewhere classified  Muscle weakness (generalized)     Problem List Patient Active Problem List   Diagnosis Date Noted  . Post-op pain 12/27/2016  . OSA (obstructive sleep apnea) 08/25/2014   Starr Lake PT, DPT, LAT, ATC  02/24/19  12:31 PM      [facility] [address] [place], [place], [address] Phone: [phone]   Fax:  [phone]  Name: Kimberly Haynes MRN: [id number] Date of Birth: 04-30-1961

## 2019-02-25 DIAGNOSIS — S42291D Other displaced fracture of upper end of right humerus, subsequent encounter for fracture with routine healing: Secondary | ICD-10-CM | POA: Diagnosis not present

## 2019-02-26 DIAGNOSIS — G4733 Obstructive sleep apnea (adult) (pediatric): Secondary | ICD-10-CM | POA: Diagnosis not present

## 2019-02-26 DIAGNOSIS — Z6833 Body mass index (BMI) 33.0-33.9, adult: Secondary | ICD-10-CM | POA: Diagnosis not present

## 2019-02-26 DIAGNOSIS — F900 Attention-deficit hyperactivity disorder, predominantly inattentive type: Secondary | ICD-10-CM | POA: Diagnosis not present

## 2019-02-26 DIAGNOSIS — F419 Anxiety disorder, unspecified: Secondary | ICD-10-CM | POA: Diagnosis not present

## 2019-02-26 DIAGNOSIS — I10 Essential (primary) hypertension: Secondary | ICD-10-CM | POA: Diagnosis not present

## 2019-02-26 DIAGNOSIS — F3181 Bipolar II disorder: Secondary | ICD-10-CM | POA: Diagnosis not present

## 2019-02-26 DIAGNOSIS — G4700 Insomnia, unspecified: Secondary | ICD-10-CM | POA: Diagnosis not present

## 2019-02-27 ENCOUNTER — Other Ambulatory Visit: Payer: Self-pay

## 2019-02-27 ENCOUNTER — Encounter: Payer: Self-pay | Admitting: Physical Therapy

## 2019-02-27 ENCOUNTER — Ambulatory Visit: Payer: 59 | Admitting: Physical Therapy

## 2019-02-27 DIAGNOSIS — M25511 Pain in right shoulder: Secondary | ICD-10-CM

## 2019-02-27 DIAGNOSIS — M6281 Muscle weakness (generalized): Secondary | ICD-10-CM | POA: Diagnosis not present

## 2019-02-27 DIAGNOSIS — M25611 Stiffness of right shoulder, not elsewhere classified: Secondary | ICD-10-CM | POA: Diagnosis not present

## 2019-02-27 NOTE — Therapy (Signed)
[place], [place], [address] Phone: [phone]   Fax:  [phone]  Physical Therapy Treatment  Patient Details  Name: Kimberly Haynes MRN: [id number] Date of Birth: Mar 31, 1961 Referring Provider (PT): Dr Ophelia Charter    Encounter Date: 02/27/2019  PT End of Session - 02/27/19 1128    Visit Number  15    Number of Visits  24    Date for PT Re-Evaluation  04/17/19    Authorization Type  [employer]    PT Start Time  1018    PT Stop Time  1110    PT Time Calculation (min)  52 min    Activity Tolerance  Patient tolerated treatment well    Behavior During Therapy  [facility] for tasks assessed/performed       Past Medical History:  Diagnosis Date  . Bipolar disorder ([place])   . Depression   . Hearing impairment   . Sleep apnea    uses dental appliance    Past Surgical History:  Procedure Laterality Date  . APPENDECTOMY    . 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 ORIF HUMERUS FRACTURE Right 01/12/2019   Procedure: OPEN REDUCTION INTERNAL FIXATION (ORIF) PROXIMAL HUMERUS FRACTURE;  Surgeon: Hiram Gash, MD;  Location: [place];  Service: Orthopedics;  Laterality: Right;  . RADIOACTIVE SEED GUIDED EXCISIONAL BREAST BIOPSY Right 11/07/2016   Procedure: RADIOACTIVE SEED GUIDED EXCISIONAL BREAST BIOPSY;  Surgeon: Rolm Bookbinder, MD;  Location: [place];  Service: General;  Laterality: Right;  . TUBAL LIGATION      There were no vitals filed for this visit.  Subjective Assessment - 02/27/19 1026    Subjective  Patient has been to the MD. The MD advised her she can be more agressive with strengthening. Her pain has been well controlled. The MD is happy with her progress. She is out of the sling. Her biggest problem right now is not being able to use her pinkie finger.      Pertinent History  depression, bipolar     Limitations  Lifting;Writing;House hold activities    Diagnostic tests  nothing post op     Patient Stated Goals  to have functional use of her right UE     Currently in Pain?  No/denies                       [facility] Adult PT Treatment/Exercise - 02/27/19 0001      Shoulder Exercises: Supine   Flexion Limitations  supine AAROM using the wand; mod cuing for ho far to go back; press to get back up and down       Shoulder Exercises: Standing   Row Limitations  given band with nadles, She is having some troble with grip; 3x10 yellow band       Shoulder Exercises: Isometric Strengthening   External Rotation Limitations  x10 5 sec hold min cuing for technique       Cryotherapy   Number Minutes Cryotherapy  10 Minutes    Cryotherapy Location  Shoulder    Type of Cryotherapy  Ice pack      Manual Therapy   Manual therapy comments  MTPR along middle deltoid and upper trap    Joint Mobilization  Grade II and II AP and inferior glides to improve  ER;     Passive ROM  flexion, abduction , ER, scaption, elbow extension with gentle distraction oscillations to promote pain relief             PT Education - 02/27/19 1046    Education Details  reviewed HEp and symptom management     Person(s) Educated  Patient    Methods  Explanation;Demonstration;Tactile cues;Verbal cues    Comprehension  Verbalized understanding;Verbal cues required;Tactile cues required;Returned demonstration       PT Short Term Goals - 02/27/19 1153      PT SHORT TERM GOAL #1   Title  Patient will demonstrate passive shoulder flexion to 60 degrees     Baseline  96    Time  4    Period  Weeks    Status  Achieved    Target Date  02/20/19      PT SHORT TERM GOAL #2   Title  Patient will begin AAROM to the right shoulder ( per MD guidlines)    Baseline  will go to the MD next week     Time  4    Period  Weeks    Status  Achieved    Target Date   02/20/19      PT SHORT TERM GOAL #3   Title  Patient will demonstrate 30 degrees of passive ROM     Baseline  17 degrees     Time  4    Period  Weeks    Status  Achieved        PT Long Term Goals - 02/27/19 1107      PT LONG TERM GOAL #1   Title  Patient will reach behind her head with right hand to brush her hair     Time  12    Period  Weeks    Status  On-going      PT LONG TERM GOAL #2   Title  Patient will use her right arm to feed herself     Time  12    Period  Weeks    Status  On-going      PT LONG TERM GOAL #3   Title  Patient will tuck her shirt in behind her without increased pain with her right arm     Time  12    Period  Weeks    Status  On-going            Plan - 02/27/19 1107    Clinical Impression Statement  The patient was given updated scapualr strengthening exercises and AAROM. She was also given isometric ER f for home. She had no increase in pain. She continues to have limitations in passive ER. She was encouraged to continue with her wand strengthening at home.     Comorbidities  depression, bipolar     Examination-Activity Limitations  Bathing;Hygiene/Grooming;Reach Overhead;Carry;Dressing;Lift;Caring for Others    Examination-Participation Restrictions  Cleaning;Driving;Laundry;Yard Work;Meal Prep    Stability/Clinical Decision Making  Evolving/Moderate complexity    Clinical Decision Making  Moderate    Rehab Potential  Good    PT Treatment/Interventions  ADLs/Self Care Home Management;Electrical Stimulation;Cryotherapy;Canalith Repostioning;Moist Heat;Ultrasound;DME Instruction;Gait training;Functional mobility training;Stair training;Therapeutic activities;Therapeutic exercise;Neuromuscular re-education;Patient/family education;Manual techniques;Passive range of motion;Taping    PT Next Visit Plan  continue with ROM; consder figer ladder. Assess New HEP    PT Home Exercise Plan  pendulum; towel squeeze; elbow pronation/supination, self PROM  supine, tableslide PROM (sliding body away opposed to sliding arm fwd)  Patient will benefit from skilled therapeutic intervention in order to improve the following deficits and impairments:  Pain, Decreased activity tolerance, Decreased endurance, Decreased strength, Decreased range of motion, Impaired UE functional use, Decreased knowledge of precautions, Decreased safety awareness  Visit Diagnosis: Acute pain of right shoulder  Stiffness of right shoulder, not elsewhere classified  Muscle weakness (generalized)     Problem List Patient Active Problem List   Diagnosis Date Noted  . Post-op pain 12/27/2016  . OSA (obstructive sleep apnea) 08/25/2014    Carney Living PT DPT  02/27/2019, 11:54 AM  [facility] [address] [place], [place], [address] Phone: [phone]   Fax:  [phone]  Name: Kimberly Haynes MRN: [id number] Date of Birth: 03-11-1961

## 2019-03-01 MED FILL — VYVANSE 60 MG CAPSULE: 60 | 90 days supply | Qty: 90 | Fill #0

## 2019-03-03 MED FILL — OXcarbazepine 300 MG TABS: 300 | 30 days supply | Qty: 220 | Fill #4

## 2019-03-04 ENCOUNTER — Ambulatory Visit: Payer: 59 | Attending: Orthopaedic Surgery

## 2019-03-04 ENCOUNTER — Other Ambulatory Visit: Payer: Self-pay

## 2019-03-04 DIAGNOSIS — M79641 Pain in right hand: Secondary | ICD-10-CM | POA: Diagnosis not present

## 2019-03-04 DIAGNOSIS — M25611 Stiffness of right shoulder, not elsewhere classified: Secondary | ICD-10-CM | POA: Insufficient documentation

## 2019-03-04 DIAGNOSIS — M25511 Pain in right shoulder: Secondary | ICD-10-CM | POA: Insufficient documentation

## 2019-03-04 DIAGNOSIS — M6281 Muscle weakness (generalized): Secondary | ICD-10-CM | POA: Diagnosis not present

## 2019-03-04 NOTE — Therapy (Signed)
[place], [place], [address] Phone: [phone]   Fax:  [phone]  Physical Therapy Treatment  Patient Details  Name: Kimberly Haynes MRN: [id number] Date of Birth: 03-12-1961 Referring Provider (PT): Dr Ophelia Charter    Encounter Date: 03/04/2019  PT End of Session - 03/04/19 1102    Visit Number  16    Number of Visits  24    Date for PT Re-Evaluation  04/17/19    Authorization Type  [employer]    PT Start Time  1100    PT Stop Time  1200    PT Time Calculation (min)  60 min    Activity Tolerance  Patient tolerated treatment well    Behavior During Therapy  [facility] for tasks assessed/performed       Past Medical History:  Diagnosis Date  . Bipolar disorder ([place])   . Depression   . Hearing impairment   . Sleep apnea    uses dental appliance    Past Surgical History:  Procedure Laterality Date  . APPENDECTOMY    . 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 ORIF HUMERUS FRACTURE Right 01/12/2019   Procedure: OPEN REDUCTION INTERNAL FIXATION (ORIF) PROXIMAL HUMERUS FRACTURE;  Surgeon: Hiram Gash, MD;  Location: [place];  Service: Orthopedics;  Laterality: Right;  . RADIOACTIVE SEED GUIDED EXCISIONAL BREAST BIOPSY Right 11/07/2016   Procedure: RADIOACTIVE SEED GUIDED EXCISIONAL BREAST BIOPSY;  Surgeon: Rolm Bookbinder, MD;  Location: [place];  Service: General;  Laterality: Right;  . TUBAL LIGATION      There were no vitals filed for this visit.  Subjective Assessment - 03/04/19 1106    Subjective  She reports doing better. Doing more with arm now. Hand swollen and stiff. Has not been icing hand .     Currently in Pain?  No/denies         [facility] PT Assessment - 03/04/19 0001      PROM   Left Shoulder Flexion  106 Degrees   post stretch   Left  Shoulder ABduction  93 Degrees   post stretch                  [facility] Adult PT Treatment/Exercise - 03/04/19 0001      Shoulder Exercises: Seated   Row  Right;12 reps    Theraband Level (Shoulder Row)  Level 1 (Yellow)    Other Seated Exercises  AAROM flexion and abduction x 10 each    Other Seated Exercises  scap retraction with yellow band x 25 reps; gripping green Dgrip 2X25      Shoulder Exercises: Standing   Row  Right;10 reps    Theraband Level (Shoulder Row)  Level 1 (Yellow)      Shoulder Exercises: Pulleys   Flexion  2 minutes    Scaption  2 minutes      Cryotherapy   Number Minutes Cryotherapy  10 Minutes    Cryotherapy Location  Shoulder    Type of Cryotherapy  Ice pack      Manual Therapy   Manual therapy comments  MTPR along middle deltoid and upper trap    Joint Mobilization  Grade II and II AP and inferior glides to improve ER; axilla     Passive ROM  flexion, abduction , ER, scaption, elbow extension with  gentle distraction oscillations to promote pain relief             PT Education - 03/04/19 1147    Education Details  discussed joint mechanics and why stiffness may change mechanics and cause pain    Person(s) Educated  Patient    Methods  Explanation    Comprehension  Verbalized understanding       PT Short Term Goals - 02/27/19 1153      PT SHORT TERM GOAL #1   Title  Patient will demonstrate passive shoulder flexion to 60 degrees     Baseline  96    Time  4    Period  Weeks    Status  Achieved    Target Date  02/20/19      PT SHORT TERM GOAL #2   Title  Patient will begin AAROM to the right shoulder ( per MD guidlines)    Baseline  will go to the MD next week     Time  4    Period  Weeks    Status  Achieved    Target Date  02/20/19      PT SHORT TERM GOAL #3   Title  Patient will demonstrate 30 degrees of passive ROM     Baseline  17 degrees     Time  4    Period  Weeks    Status  Achieved        PT Long Term  Goals - 02/27/19 1107      PT LONG TERM GOAL #1   Title  Patient will reach behind her head with right hand to brush her hair     Time  12    Period  Weeks    Status  On-going      PT LONG TERM GOAL #2   Title  Patient will use her right arm to feed herself     Time  12    Period  Weeks    Status  On-going      PT LONG TERM GOAL #3   Title  Patient will tuck her shirt in behind her without increased pain with her right arm     Time  12    Period  Weeks    Status  On-going            Plan - 03/04/19 1109    Clinical Impression Statement  Range slowly improveing.   Pain improved . Still going gently with resistance and ROM.     PT Treatment/Interventions  ADLs/Self Care Home Management;Electrical Stimulation;Cryotherapy;Canalith Repostioning;Moist Heat;Ultrasound;DME Instruction;Gait training;Functional mobility training;Stair training;Therapeutic activities;Therapeutic exercise;Neuromuscular re-education;Patient/family education;Manual techniques;Passive range of motion;Taping    PT Next Visit Plan  continue with ROM; consder finger ladder.    PT Home Exercise Plan  pendulum; towel squeeze; elbow pronation/supination, self PROM supine, tableslide PROM (sliding body away opposed to sliding arm fwd)    Consulted and Agree with Plan of Care  Patient       Patient will benefit from skilled therapeutic intervention in order to improve the following deficits and impairments:  Pain, Decreased activity tolerance, Decreased endurance, Decreased strength, Decreased range of motion, Impaired UE functional use, Decreased knowledge of precautions, Decreased safety awareness  Visit Diagnosis: Acute pain of right shoulder  Muscle weakness (generalized)  Stiffness of right shoulder, not elsewhere classified     Problem List Patient Active Problem List   Diagnosis Date Noted  . Post-op pain 12/27/2016  . OSA (obstructive  sleep apnea) 08/25/2014    Darrel Hoover  PT 03/04/2019,  11:49 AM  [facility] [address] [place], [place], [address] Phone: [phone]   Fax:  [phone]  Name: Kimberly Haynes MRN: [id number] Date of Birth: 1960/11/04

## 2019-03-06 ENCOUNTER — Other Ambulatory Visit: Payer: Self-pay

## 2019-03-06 ENCOUNTER — Ambulatory Visit: Payer: 59

## 2019-03-06 DIAGNOSIS — M25611 Stiffness of right shoulder, not elsewhere classified: Secondary | ICD-10-CM | POA: Diagnosis not present

## 2019-03-06 DIAGNOSIS — M79641 Pain in right hand: Secondary | ICD-10-CM | POA: Diagnosis not present

## 2019-03-06 DIAGNOSIS — M6281 Muscle weakness (generalized): Secondary | ICD-10-CM

## 2019-03-06 DIAGNOSIS — M25511 Pain in right shoulder: Secondary | ICD-10-CM | POA: Diagnosis not present

## 2019-03-06 NOTE — Therapy (Signed)
[place], [place], [address] Phone: [phone]   Fax:  [phone]  Physical Therapy Treatment  Patient Details  Name: Kimberly Haynes MRN: [id number] Date of Birth: [birth year] Referring Provider (PT): Dr Ophelia Charter    Encounter Date: 03/06/2019  PT End of Session - 03/06/19 1022    Visit Number  17    Number of Visits  24    Date for PT Re-Evaluation  04/17/19    Authorization Type  [employer]    PT Start Time  1015    PT Stop Time  1105    PT Time Calculation (min)  50 min    Activity Tolerance  Patient tolerated treatment well    Behavior During Therapy  [facility] for tasks assessed/performed       Past Medical History:  Diagnosis Date  . Bipolar disorder ([place])   . Depression   . Hearing impairment   . Sleep apnea    uses dental appliance    Past Surgical History:  Procedure Laterality Date  . APPENDECTOMY    . 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 ORIF HUMERUS FRACTURE Right 01/12/2019   Procedure: OPEN REDUCTION INTERNAL FIXATION (ORIF) PROXIMAL HUMERUS FRACTURE;  Surgeon: Hiram Gash, MD;  Location: [place];  Service: Orthopedics;  Laterality: Right;  . RADIOACTIVE SEED GUIDED EXCISIONAL BREAST BIOPSY Right 11/07/2016   Procedure: RADIOACTIVE SEED GUIDED EXCISIONAL BREAST BIOPSY;  Surgeon: Rolm Bookbinder, MD;  Location: [place];  Service: General;  Laterality: Right;  . TUBAL LIGATION      There were no vitals filed for this visit.  Subjective Assessment - 03/06/19 1021    Subjective  Doing well no pain at rest    Currently in Pain?  No/denies                       [facility] Adult PT Treatment/Exercise - 03/06/19 0001      Shoulder Exercises: Supine   Other Supine Exercises  AAROM flexion /rotation /abduction.  12-15 reps   and diagonals  ant LT hip to abduct/ER RT shoulder.       Shoulder Exercises: Standing   Protraction  12 reps;Right    Theraband Level (Shoulder Protraction)  Level 1 (Yellow)    Internal Rotation  Right;12 reps    Theraband Level (Shoulder Internal Rotation)  Level 1 (Yellow)    Flexion  Right;12 reps    Theraband Level (Shoulder Flexion)  Level 1 (Yellow)    ABduction  Right;12 reps    ABduction Limitations  elbow bent    Extension  Right    Extension Weight (lbs)  0    Extension Limitations  20     Row  Right;12 reps      Shoulder Exercises: Pulleys   Flexion  2 minutes    Scaption  2 minutes      Cryotherapy   Number Minutes Cryotherapy  10 Minutes    Cryotherapy Location  Shoulder    Type of Cryotherapy  Ice pack      Manual Therapy   Manual therapy comments  MTPR along middle deltoid and upper trap    Joint Mobilization  Grade II and III AP and inferior glides to improve ER; axilla     Passive ROM  flexion, abduction , ER,  scaption, with gentle distraction oscillations to promote pain relief, also scapula ROm  all side lye and supine.                PT Short Term Goals - 02/27/19 1153      PT SHORT TERM GOAL #1   Title  Patient will demonstrate passive shoulder flexion to 60 degrees     Baseline  96    Time  4    Period  Weeks    Status  Achieved    Target Date  02/20/19      PT SHORT TERM GOAL #2   Title  Patient will begin AAROM to the right shoulder ( per MD guidlines)    Baseline  will go to the MD next week     Time  4    Period  Weeks    Status  Achieved    Target Date  02/20/19      PT SHORT TERM GOAL #3   Title  Patient will demonstrate 30 degrees of passive ROM     Baseline  17 degrees     Time  4    Period  Weeks    Status  Achieved        PT Long Term Goals - 03/06/19 1055      PT LONG TERM GOAL #1   Title  Patient will reach behind her head with right hand to brush her hair     Status  On-going      PT LONG TERM GOAL #2    Title  Patient will use her right arm to feed herself     Baseline  limited more by hand pain.     Status  Partially Met      PT LONG TERM GOAL #3   Title  Patient will tuck her shirt in behind her without increased pain with her right arm     Status  On-going            Plan - 03/06/19 1022    Clinical Impression Statement  She is actively doing more with ROM needing only minor assist within available ROM without significant  pain.     PT Treatment/Interventions  ADLs/Self Care Home Management;Electrical Stimulation;Cryotherapy;Canalith Repostioning;Moist Heat;Ultrasound;DME Instruction;Gait training;Functional mobility training;Stair training;Therapeutic activities;Therapeutic exercise;Neuromuscular re-education;Patient/family education;Manual techniques;Passive range of motion;Taping    PT Next Visit Plan  continue with ROM, gentle strengthening ; consider finger ladder.    PT Home Exercise Plan  pendulum; towel squeeze; elbow pronation/supination, self PROM supine, tableslide PROM (sliding body away opposed to sliding arm fwd)    Consulted and Agree with Plan of Care  Patient       Patient will benefit from skilled therapeutic intervention in order to improve the following deficits and impairments:  Pain, Decreased activity tolerance, Decreased endurance, Decreased strength, Decreased range of motion, Impaired UE functional use, Decreased knowledge of precautions, Decreased safety awareness  Visit Diagnosis: Acute pain of right shoulder  Muscle weakness (generalized)  Stiffness of right shoulder, not elsewhere classified     Problem List Patient Active Problem List   Diagnosis Date Noted  . Post-op pain 12/27/2016  . OSA (obstructive sleep apnea) 08/25/2014    Darrel Hoover  PT 03/06/2019, 10:57 AM  [facility] [address] [place], [place], [address] Phone: [phone]   Fax:  [phone]  Name: Sydnee Lamour MRN: [id number] Date of Birth: [birth year]

## 2019-03-11 ENCOUNTER — Ambulatory Visit: Payer: 59 | Admitting: Physical Therapy

## 2019-03-11 ENCOUNTER — Other Ambulatory Visit: Payer: Self-pay

## 2019-03-11 DIAGNOSIS — M79641 Pain in right hand: Secondary | ICD-10-CM | POA: Diagnosis not present

## 2019-03-11 DIAGNOSIS — M25611 Stiffness of right shoulder, not elsewhere classified: Secondary | ICD-10-CM | POA: Diagnosis not present

## 2019-03-11 DIAGNOSIS — M25511 Pain in right shoulder: Secondary | ICD-10-CM

## 2019-03-11 DIAGNOSIS — M6281 Muscle weakness (generalized): Secondary | ICD-10-CM | POA: Diagnosis not present

## 2019-03-11 NOTE — Therapy (Signed)
[place], [place], [address] Phone: [phone]   Fax:  [phone]  Physical Therapy Treatment  Patient Details  Name: Kimberly Haynes MRN: [id number] Date of Birth: August 15, 1961 Referring Provider (PT): Dr Ophelia Charter    Encounter Date: 03/11/2019  PT End of Session - 03/11/19 0936    Visit Number  18    Number of Visits  24    Date for PT Re-Evaluation  04/17/19    Authorization Type  cone [employer]    PT Start Time  0845    PT Stop Time  0940   last 10 min on ice   PT Time Calculation (min)  55 min    Activity Tolerance  Patient tolerated treatment well    Behavior During Therapy  [facility] for tasks assessed/performed       Past Medical History:  Diagnosis Date  . Bipolar disorder ([place])   . Depression   . Hearing impairment   . Sleep apnea    uses dental appliance    Past Surgical History:  Procedure Laterality Date  . APPENDECTOMY    . 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 ORIF HUMERUS FRACTURE Right 01/12/2019   Procedure: OPEN REDUCTION INTERNAL FIXATION (ORIF) PROXIMAL HUMERUS FRACTURE;  Surgeon: Hiram Gash, MD;  Location: [place];  Service: Orthopedics;  Laterality: Right;  . RADIOACTIVE SEED GUIDED EXCISIONAL BREAST BIOPSY Right 11/07/2016   Procedure: RADIOACTIVE SEED GUIDED EXCISIONAL BREAST BIOPSY;  Surgeon: Rolm Bookbinder, MD;  Location: [place];  Service: General;  Laterality: Right;  . TUBAL LIGATION      There were no vitals filed for this visit.  Subjective Assessment - 03/11/19 0855    Subjective  Pt reports some soreness after last session, requests to back down a little today.    Currently in Pain?  Yes    Pain Score  5     Pain Location  Shoulder    Pain Orientation  Right    Pain Descriptors / Indicators  Aching;Sore    Pain  Type  Chronic pain;Surgical pain                       [facility] Adult PT Treatment/Exercise - 03/11/19 0001      Shoulder Exercises: Supine   Diagonals Limitations  AROM D1 and D2 flexion supine    Other Supine Exercises  AAROM flexion, abd, ER with 2 lbs on dow.       Shoulder Exercises: Standing   Protraction  12 reps;Right    Theraband Level (Shoulder Protraction)  Level 1 (Yellow)    Internal Rotation  Right;12 reps    Theraband Level (Shoulder Internal Rotation)  Level 1 (Yellow)    Flexion  AROM;Right;15 reps    ABduction  AROM;Right;15 reps    Extension  Both;15 reps    Theraband Level (Shoulder Extension)  Level 1 (Yellow)    Row  Both;15 reps    Theraband Level (Shoulder Row)  Level 1 (Yellow)    Other Standing Exercises  elbow ext and elbow flexion resisted with yellow X 15 ea      Shoulder Exercises: Pulleys   Flexion  2 minutes    Scaption  2 minutes      Shoulder Exercises: ROM/Strengthening   Other ROM/Strengthening Exercises  wall ladder  flexion and scaption X 10 ea      Cryotherapy   Number Minutes Cryotherapy  10 Minutes    Cryotherapy Location  Shoulder    Type of Cryotherapy  Ice pack      Manual Therapy   Manual therapy comments  MTPR along middle deltoid and upper trap    Joint Mobilization  Grade II and III AP and inferior glides to improve ER; axilla     Passive ROM  flexion, abduction , ER, scaption, with gentle distraction oscillations to promote pain relief, also scapula ROm  all side lye and supine.                PT Short Term Goals - 02/27/19 1153      PT SHORT TERM GOAL #1   Title  Patient will demonstrate passive shoulder flexion to 60 degrees     Baseline  96    Time  4    Period  Weeks    Status  Achieved    Target Date  02/20/19      PT SHORT TERM GOAL #2   Title  Patient will begin AAROM to the right shoulder ( per MD guidlines)    Baseline  will go to the MD next week     Time  4    Period  Weeks     Status  Achieved    Target Date  02/20/19      PT SHORT TERM GOAL #3   Title  Patient will demonstrate 30 degrees of passive ROM     Baseline  17 degrees     Time  4    Period  Weeks    Status  Achieved        PT Long Term Goals - 03/06/19 1055      PT LONG TERM GOAL #1   Title  Patient will reach behind her head with right hand to brush her hair     Status  On-going      PT LONG TERM GOAL #2   Title  Patient will use her right arm to feed herself     Baseline  limited more by hand pain.     Status  Partially Met      PT LONG TERM GOAL #3   Title  Patient will tuck her shirt in behind her without increased pain with her right arm     Status  On-going            Plan - 03/11/19 0936    Clinical Impression Statement  She had some soreness today so resistance not applied to standing flexion and abduction today. Other than that she had good tolerance to ROM and strength program today without complaints. She is slowing gaining PROM and she will continue to benefit from PT.    PT Treatment/Interventions  ADLs/Self Care Home Management;Electrical Stimulation;Cryotherapy;Canalith Repostioning;Moist Heat;Ultrasound;DME Instruction;Gait training;Functional mobility training;Stair training;Therapeutic activities;Therapeutic exercise;Neuromuscular re-education;Patient/family education;Manual techniques;Passive range of motion;Taping    PT Next Visit Plan  continue with ROM, gentle strengthening ; consider finger ladder.    PT Home Exercise Plan  pendulum; towel squeeze; elbow pronation/supination, self PROM supine, tableslide PROM (sliding body away opposed to sliding arm fwd)    Consulted and Agree with Plan of Care  Patient       Patient will benefit from skilled therapeutic intervention in order to improve the following deficits and impairments:  Pain, Decreased activity tolerance, Decreased endurance, Decreased strength, Decreased range of  motion, Impaired UE functional use,  Decreased knowledge of precautions, Decreased safety awareness  Visit Diagnosis: Acute pain of right shoulder  Muscle weakness (generalized)  Stiffness of right shoulder, not elsewhere classified     Problem List Patient Active Problem List   Diagnosis Date Noted  . Post-op pain 12/27/2016  . OSA (obstructive sleep apnea) 08/25/2014    [name] 03/11/2019, 9:38 AM  [facility] [address] [place], [place], [address] Phone: [phone]   Fax:  [phone]  Name: Kimberly Haynes MRN: [id number] Date of Birth: 23-Aug-1961

## 2019-03-14 ENCOUNTER — Encounter: Payer: Self-pay | Admitting: Physical Therapy

## 2019-03-14 ENCOUNTER — Telehealth: Payer: Self-pay | Admitting: Physical Therapy

## 2019-03-14 ENCOUNTER — Ambulatory Visit: Payer: 59 | Admitting: Physical Therapy

## 2019-03-14 ENCOUNTER — Other Ambulatory Visit: Payer: Self-pay

## 2019-03-14 DIAGNOSIS — M25611 Stiffness of right shoulder, not elsewhere classified: Secondary | ICD-10-CM | POA: Diagnosis not present

## 2019-03-14 DIAGNOSIS — M6281 Muscle weakness (generalized): Secondary | ICD-10-CM

## 2019-03-14 DIAGNOSIS — M25511 Pain in right shoulder: Secondary | ICD-10-CM

## 2019-03-14 DIAGNOSIS — M79641 Pain in right hand: Secondary | ICD-10-CM | POA: Diagnosis not present

## 2019-03-14 NOTE — Therapy (Signed)
[place], [place], [address] Phone: [phone]   Fax:  [phone]  Physical Therapy Treatment  Patient Details  Name: Kimberly Haynes MRN: [id number] Date of Birth: [birth year] Referring Provider (PT): Dr Ophelia Charter    Encounter Date: 03/14/2019  PT End of Session - 03/14/19 1439    Visit Number  19    Number of Visits  24    Date for PT Re-Evaluation  04/17/19    Authorization Type  [employer]    PT Start Time  1145    PT Stop Time  1237    PT Time Calculation (min)  52 min    Activity Tolerance  Patient tolerated treatment well    Behavior During Therapy  [facility] for tasks assessed/performed       Past Medical History:  Diagnosis Date  . Bipolar disorder ([place])   . Depression   . Hearing impairment   . Sleep apnea    uses dental appliance    Past Surgical History:  Procedure Laterality Date  . APPENDECTOMY    . 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 ORIF HUMERUS FRACTURE Right 01/12/2019   Procedure: OPEN REDUCTION INTERNAL FIXATION (ORIF) PROXIMAL HUMERUS FRACTURE;  Surgeon: Hiram Gash, MD;  Location: [place];  Service: Orthopedics;  Laterality: Right;  . RADIOACTIVE SEED GUIDED EXCISIONAL BREAST BIOPSY Right 11/07/2016   Procedure: RADIOACTIVE SEED GUIDED EXCISIONAL BREAST BIOPSY;  Surgeon: Rolm Bookbinder, MD;  Location: [place];  Service: General;  Laterality: Right;  . TUBAL LIGATION      There were no vitals filed for this visit.  Subjective Assessment - 03/14/19 1435    Subjective  Pt. received referral for hand therapy as PT order from last MD visit. Referral for PT hand therapy requested from MD via phone message today. Pt. continues with shoulder soreness as well as hand stiffness limiting wrist and finger flexion ROM.    Currently in  Pain?  Yes    Pain Score  4     Pain Location  Shoulder    Pain Orientation  Right    Pain Descriptors / Indicators  Aching;Sore    Pain Type  Chronic pain;Surgical pain    Pain Onset  More than a month ago    Pain Frequency  Intermittent    Aggravating Factors   activity, reaching    Pain Relieving Factors  rest    Effect of Pain on Daily Activities  limits tolerance and ability for reaching ADLs.                       [place] Adult PT Treatment/Exercise - 03/14/19 0001      Shoulder Exercises: Supine   Diagonals Limitations  AROM D1, D2 x 10 ea.    Other Supine Exercises  AAROM flexion with 2 lb. on dowel x10, ER x10      Shoulder Exercises: Standing   Protraction  Right;12 reps    Theraband Level (Shoulder Protraction)  Level 1 (Yellow)    Internal Rotation  Right;20 reps    Theraband Level (Shoulder Internal Rotation)  Level 1 (Yellow)    Flexion  --   [place] flexion yellow x 12 reps   Flexion Limitations  also performed AROM to 90 deg  x 15 reps  ABduction  --   partial range [place] abd to 30 deg yellow x 12 reps   Extension  Right;20 reps    Theraband Level (Shoulder Extension)  Level 1 (Yellow)    Row  Both;20 reps    Theraband Level (Shoulder Row)  Level 1 (Yellow)      Shoulder Exercises: Pulleys   Flexion  2 minutes    Scaption  2 minutes      Cryotherapy   Number Minutes Cryotherapy  10 Minutes    Cryotherapy Location  Shoulder    Type of Cryotherapy  Ice pack      Manual Therapy   Soft tissue mobilization  STM right posterior shoulder, subscapularis and added extensor digitorum/wrist extensor region    Passive ROM  Right shoulder flexion, abduction, ER emphasis             PT Education - 03/14/19 1439    Education Details  POC, mechanics for wrist and finger motion    Person(s) Educated  Patient    Methods  Explanation;Demonstration    Comprehension  Verbalized understanding       PT Short Term Goals - 02/27/19 1153       PT SHORT TERM GOAL #1   Title  Patient will demonstrate passive shoulder flexion to 60 degrees     Baseline  96    Time  4    Period  Weeks    Status  Achieved    Target Date  02/20/19      PT SHORT TERM GOAL #2   Title  Patient will begin AAROM to the right shoulder ( per MD guidlines)    Baseline  will go to the MD next week     Time  4    Period  Weeks    Status  Achieved    Target Date  02/20/19      PT SHORT TERM GOAL #3   Title  Patient will demonstrate 30 degrees of passive ROM     Baseline  17 degrees     Time  4    Period  Weeks    Status  Achieved        PT Long Term Goals - 03/06/19 1055      PT LONG TERM GOAL #1   Title  Patient will reach behind her head with right hand to brush her hair     Status  On-going      PT LONG TERM GOAL #2   Title  Patient will use her right arm to feed herself     Baseline  limited more by hand pain.     Status  Partially Met      PT LONG TERM GOAL #3   Title  Patient will tuck her shirt in behind her without increased pain with her right arm     Status  On-going            Plan - 03/14/19 1441    Clinical Impression Statement  Discussed status with hand with ongoing limitations and pt. wishes to see hand therapist-MD referral requested for OT hand therapy order. Fair status with shoulder with continued stiffness and weakness limiting reaching ability but making gradual progress with this.    Rehab Potential  Good    PT Frequency  3x / week    PT Duration  12 weeks    PT Treatment/Interventions  ADLs/Self Care Home Management;Electrical Stimulation;Cryotherapy;Canalith Repostioning;Moist Heat;Ultrasound;DME Instruction;Gait training;Functional mobility training;Stair training;Therapeutic activities;Therapeutic  exercise;Neuromuscular re-education;Patient/family education;Manual techniques;Passive range of motion;Taping    PT Next Visit Plan  continue with ROM, gentle strengthening ; consider finger ladder.    PT Home  Exercise Plan  pendulum; towel squeeze; elbow pronation/supination, self PROM supine, tableslide PROM (sliding body away opposed to sliding arm fwd)    Consulted and Agree with Plan of Care  Patient       Patient will benefit from skilled therapeutic intervention in order to improve the following deficits and impairments:  Pain, Decreased activity tolerance, Decreased endurance, Decreased strength, Decreased range of motion, Impaired UE functional use, Decreased knowledge of precautions, Decreased safety awareness  Visit Diagnosis: Acute pain of right shoulder  Muscle weakness (generalized)  Stiffness of right shoulder, not elsewhere classified     Problem List Patient Active Problem List   Diagnosis Date Noted  . Post-op pain 12/27/2016  . OSA (obstructive sleep apnea) 08/25/2014    Beaulah Dinning, PT, DPT 03/14/19 2:44 PM  [place] [facility] [address] [place], [place], [address] Phone: [phone]   Fax:  [phone]  Name: Kimberly Haynes MRN: [id number] Date of Birth: Apr 24, 1961

## 2019-03-14 NOTE — Telephone Encounter (Signed)
Called [facility] and left message for Dr. Griffin Basil to request referral for OT hand therapy to be faxed to hand therapy at [place] [facility] office.

## 2019-03-17 ENCOUNTER — Other Ambulatory Visit: Payer: Self-pay

## 2019-03-17 ENCOUNTER — Ambulatory Visit: Payer: 59 | Admitting: Physical Therapy

## 2019-03-17 DIAGNOSIS — M25511 Pain in right shoulder: Secondary | ICD-10-CM | POA: Diagnosis not present

## 2019-03-17 DIAGNOSIS — M79641 Pain in right hand: Secondary | ICD-10-CM | POA: Diagnosis not present

## 2019-03-17 DIAGNOSIS — M6281 Muscle weakness (generalized): Secondary | ICD-10-CM

## 2019-03-17 DIAGNOSIS — M25611 Stiffness of right shoulder, not elsewhere classified: Secondary | ICD-10-CM | POA: Diagnosis not present

## 2019-03-17 NOTE — Patient Instructions (Signed)
Access Code: [email or web address]  URL: [email or web address]  Date: 03/17/2019  Prepared by: Elsie Ra   Exercises  Tip Pinch with Putty - 10 reps - 3 sets - 2x daily - 6x weekly  Finger Lumbricals with Putty - 10 reps - 3 sets - 2x daily - 6x weekly  Key Pinch with Putty - 10 reps - 3 sets - 2x daily - 6x weekly  Rolling Putty on Table - 10 reps - 3 sets - 2x daily - 6x weekly  Hook Fist with Putty - 10 reps - 3 sets - 2x daily - 6x weekly  Finger Adduction with Putty - 10 reps - 3 sets - 2x daily - 6x weekly  Seated Finger Extension with Putty - 10 reps - 3 sets - 2x daily - 6x weekly  Seated Wrist Supination Pronation with Can - 10 reps - 1-3 sets - 2x daily - 6x weekly  Wrist Extension with Dumbbell - 10 reps - 3 sets - 2x daily - 6x weekly  Wrist Extension with Resistance Bar - 10 reps - 3 sets - 2x daily - 6x weekly

## 2019-03-17 NOTE — Therapy (Signed)
[place], [place], [address] Phone: [phone]   Fax:  [phone]  Physical Therapy Treatment  Patient Details  Name: Kimberly Haynes MRN: [id number] Date of Birth: 09-29-1961 Referring Provider (PT): Dr Ophelia Charter    Encounter Date: 03/17/2019  PT End of Session - 03/17/19 1009    Visit Number  20    Number of Visits  24    Date for PT Re-Evaluation  04/17/19    Authorization Type  [employer]    PT Start Time  0900    PT Stop Time  0955   last 10 min on ice   PT Time Calculation (min)  55 min    Activity Tolerance  Patient tolerated treatment well    Behavior During Therapy  [facility] for tasks assessed/performed       Past Medical History:  Diagnosis Date  . Bipolar disorder ([place])   . Depression   . Hearing impairment   . Sleep apnea    uses dental appliance    Past Surgical History:  Procedure Laterality Date  . APPENDECTOMY    . 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 ORIF HUMERUS FRACTURE Right 01/12/2019   Procedure: OPEN REDUCTION INTERNAL FIXATION (ORIF) PROXIMAL HUMERUS FRACTURE;  Surgeon: Hiram Gash, MD;  Location: [place];  Service: Orthopedics;  Laterality: Right;  . RADIOACTIVE SEED GUIDED EXCISIONAL BREAST BIOPSY Right 11/07/2016   Procedure: RADIOACTIVE SEED GUIDED EXCISIONAL BREAST BIOPSY;  Surgeon: Rolm Bookbinder, MD;  Location: [place];  Service: General;  Laterality: Right;  . TUBAL LIGATION      There were no vitals filed for this visit.  Subjective Assessment - 03/17/19 1007    Subjective  She is still having some hand/wrist pain, PT screened this today but does recommend OT consult and MD office was contacted last week about faxing referral over to neuro rehab office for OT     Currently in Pain?  Yes    Pain Score  4     Pain Location  Shoulder   and hand   Pain Orientation  Left                       [facility] Adult PT Treatment/Exercise - 03/17/19 0001      Shoulder Exercises: Supine   Diagonals Limitations  AROM D1, D2 x 10 ea.      Shoulder Exercises: Standing   External Rotation  Right;20 reps;Theraband    Theraband Level (Shoulder External Rotation)  Level 1 (Yellow)    Internal Rotation  Right;20 reps    Theraband Level (Shoulder Internal Rotation)  Level 1 (Yellow)    Flexion  Right;20 reps   rockwood flexion   Theraband Level (Shoulder Flexion)  Level 1 (Yellow)    Extension  Right;20 reps    Theraband Level (Shoulder Extension)  Level 1 (Yellow)    Row  Both;20 reps    Theraband Level (Shoulder Row)  Level 1 (Yellow)      Shoulder Exercises: Pulleys   Flexion  2 minutes    Scaption  2 minutes      Modalities   Modalities  Cryotherapy;Moist Heat      Moist Heat Therapy   Number Minutes Moist Heat  10 Minutes   with fist wrapped tightly  Moist Heat Location  Hand      Cryotherapy   Number Minutes Cryotherapy  10 Minutes    Cryotherapy Location  Shoulder    Type of Cryotherapy  Ice pack      Manual Therapy   Passive ROM  Right shoulder flexion, abduction, ER emphasis , then wrist mobs distraction, flexion, extension,       wrist/handExercises  Tip Pinch with Putty - 10 reps  Finger Lumbricals with Putty - 10 reps -   Key Pinch with Putty - 10 reps  Rolling Putty on Table for wrist ext - 10 reps  Hook Fist with Putty - 10 reps   Finger Adduction with Putty - 10 reps eekly  Seated Finger Extension with Putty 10 reps Seated Wrist Supination Pronation with 2 lb- 10 reps   Wrist Extension with Dumbbell  2 lbs - 10 reps  Wrist flexion/Extension with yellow Resistance Bar - 10 reps        PT Education - 03/17/19 1008    Education Details  HEP for wrist and fingers    Person(s) Educated  Patient    Methods  Explanation;Demonstration;Verbal cues;Handout     Comprehension  Verbalized understanding;Returned demonstration;Need further instruction       PT Short Term Goals - 02/27/19 1153      PT SHORT TERM GOAL #1   Title  Patient will demonstrate passive shoulder flexion to 60 degrees     Baseline  96    Time  4    Period  Weeks    Status  Achieved    Target Date  02/20/19      PT SHORT TERM GOAL #2   Title  Patient will begin AAROM to the right shoulder ( per MD guidlines)    Baseline  will go to the MD next week     Time  4    Period  Weeks    Status  Achieved    Target Date  02/20/19      PT SHORT TERM GOAL #3   Title  Patient will demonstrate 30 degrees of passive ROM     Baseline  17 degrees     Time  4    Period  Weeks    Status  Achieved        PT Long Term Goals - 03/06/19 1055      PT LONG TERM GOAL #1   Title  Patient will reach behind her head with right hand to brush her hair     Status  On-going      PT LONG TERM GOAL #2   Title  Patient will use her right arm to feed herself     Baseline  limited more by hand pain.     Status  Partially Met      PT LONG TERM GOAL #3   Title  Patient will tuck her shirt in behind her without increased pain with her right arm     Status  On-going            Plan - 03/17/19 1010    Clinical Impression Statement  Session focused on shoulder ROM and strength along with adding wrist and hand exercises for HEP until she receives OT consult. She does lack finger opposition and can not make a full fist along with weakness in wrist extension and pronation/supination.     Rehab Potential  Good    PT Frequency  3x / week    PT  Duration  12 weeks    PT Treatment/Interventions  ADLs/Self Care Home Management;Electrical Stimulation;Cryotherapy;Canalith Repostioning;Moist Heat;Ultrasound;DME Instruction;Gait training;Functional mobility training;Stair training;Therapeutic activities;Therapeutic exercise;Neuromuscular re-education;Patient/family education;Manual techniques;Passive  range of motion;Taping    PT Next Visit Plan  continue with ROM, gentle strengthening ; consider finger ladder.    PT Home Exercise Plan  pendulum; towel squeeze; elbow pronation/supination, self PROM supine, tableslide PROM (sliding body away opposed to sliding arm fwd) added putty exercises and wrist flex/ext, pro/sup    Consulted and Agree with Plan of Care  Patient       Patient will benefit from skilled therapeutic intervention in order to improve the following deficits and impairments:  Pain, Decreased activity tolerance, Decreased endurance, Decreased strength, Decreased range of motion, Impaired UE functional use, Decreased knowledge of precautions, Decreased safety awareness  Visit Diagnosis: Acute pain of right shoulder  Muscle weakness (generalized)  Stiffness of right shoulder, not elsewhere classified  Pain in right hand     Problem List Patient Active Problem List   Diagnosis Date Noted  . Post-op pain 12/27/2016  . OSA (obstructive sleep apnea) 08/25/2014    [name] 03/17/2019, 10:31 AM  [facility] [address] [place], [place], [address] Phone: [phone]   Fax:  [phone]  Name: Zaakirah Kistner MRN: [id number] Date of Birth: [birth year]

## 2019-03-19 ENCOUNTER — Other Ambulatory Visit: Payer: Self-pay

## 2019-03-19 ENCOUNTER — Ambulatory Visit: Payer: 59 | Admitting: Physical Therapy

## 2019-03-19 ENCOUNTER — Other Ambulatory Visit: Payer: Self-pay | Admitting: Family Medicine

## 2019-03-19 DIAGNOSIS — M6281 Muscle weakness (generalized): Secondary | ICD-10-CM

## 2019-03-19 DIAGNOSIS — Z1231 Encounter for screening mammogram for malignant neoplasm of breast: Secondary | ICD-10-CM

## 2019-03-19 DIAGNOSIS — M79641 Pain in right hand: Secondary | ICD-10-CM | POA: Diagnosis not present

## 2019-03-19 DIAGNOSIS — M25611 Stiffness of right shoulder, not elsewhere classified: Secondary | ICD-10-CM | POA: Diagnosis not present

## 2019-03-19 DIAGNOSIS — M25511 Pain in right shoulder: Secondary | ICD-10-CM | POA: Diagnosis not present

## 2019-03-19 NOTE — Therapy (Signed)
[place], [place], [address] Phone: [phone]   Fax:  [phone]  Physical Therapy Treatment  Patient Details  Name: Kimberly Haynes MRN: [id number] Date of Birth: [birth year] Referring Provider (PT): Dr Ophelia Charter    Encounter Date: 03/19/2019  PT End of Session - 03/19/19 1019    Visit Number  21    Number of Visits  24    Date for PT Re-Evaluation  04/17/19    Authorization Type  [employer], no VL    PT Start Time  0900    PT Stop Time  0955   last 10 min on ice   PT Time Calculation (min)  55 min    Activity Tolerance  Patient tolerated treatment well    Behavior During Therapy   [facility] for tasks assessed/performed       Past Medical History:  Diagnosis Date  . Bipolar disorder ([place])   . Depression   . Hearing impairment   . Sleep apnea    uses dental appliance    Past Surgical History:  Procedure Laterality Date  . APPENDECTOMY    . 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 ORIF HUMERUS FRACTURE Right 01/12/2019   Procedure: OPEN REDUCTION INTERNAL FIXATION (ORIF) PROXIMAL HUMERUS FRACTURE;  Surgeon: Hiram Gash, MD;  Location: [place];  Service: Orthopedics;  Laterality: Right;  . RADIOACTIVE SEED GUIDED EXCISIONAL BREAST BIOPSY Right 11/07/2016   Procedure: RADIOACTIVE SEED GUIDED EXCISIONAL BREAST BIOPSY;  Surgeon: Rolm Bookbinder, MD;  Location: [place];  Service: General;  Laterality: Right;  . TUBAL LIGATION      There were no vitals filed for this visit.  Subjective Assessment - 03/19/19 0954    Subjective  "The shoulder is doing a little better, the hand is very swollen today, I need to call the other place back they called me this morning about OT eval."    Patient Stated Goals  to have functional use of her right UE     Currently in  Pain?  Yes    Pain Score  5     Pain Location  Shoulder   and hand   Pain Orientation  Right    Pain Descriptors / Indicators  Aching;Sore    Pain Type  Chronic pain;Surgical pain                       [facility] Adult PT Treatment/Exercise - 03/19/19 0001      Shoulder Exercises: Supine   Diagonals Limitations  AROM D1, D2 x 15 ea.    Other Supine Exercises  wand ER X 20, wand chest press into shoulder flexon 2 lbs  x15      Shoulder Exercises: Standing   Protraction  Right;20 reps    Theraband Level (Shoulder Protraction)  Level 1 (Yellow)    Protraction Limitations  with rockwood flexion     External Rotation  Right;20 reps;Theraband    Theraband Level (Shoulder External Rotation)  Level 1 (Yellow)    Internal Rotation  Right;20 reps    Theraband Level (Shoulder Internal Rotation)  Level 1 (Yellow)    ABduction  AROM;Right   2X10   Extension  Right;20 reps    Theraband Level (Shoulder Extension)  Level 2 (Red)    Row  Both;20 reps  Theraband Level (Shoulder Row)  Level 2 (Red)      Shoulder Exercises: Pulleys   Flexion  2 minutes    Scaption  2 minutes      Shoulder Exercises: ROM/Strengthening   UBE (Upper Arm Bike)  2 min fwd, 2 min retro, no reistance    Ranger  flexion and scaption and circles X 15 ea      Modalities   Modalities  Cryotherapy      Cryotherapy   Number Minutes Cryotherapy  10 Minutes    Cryotherapy Location  Shoulder    Type of Cryotherapy  Ice pack      Manual Therapy   Passive ROM  Right shoulder flexion, abduction, ER emphasis, GH mobs gentle grade 1-2  inferior, distraction, A-P               PT Short Term Goals - 02/27/19 1153      PT SHORT TERM GOAL #1   Title  Patient will demonstrate passive shoulder flexion to 60 degrees     Baseline  96    Time  4    Period  Weeks    Status  Achieved    Target Date  02/20/19      PT SHORT TERM GOAL #2   Title  Patient will begin AAROM to the right shoulder ( per MD  guidlines)    Baseline  will go to the MD next week     Time  4    Period  Weeks    Status  Achieved    Target Date  02/20/19      PT SHORT TERM GOAL #3   Title  Patient will demonstrate 30 degrees of passive ROM     Baseline  17 degrees     Time  4    Period  Weeks    Status  Achieved        PT Long Term Goals - 03/06/19 1055      PT LONG TERM GOAL #1   Title  Patient will reach behind her head with right hand to brush her hair     Status  On-going      PT LONG TERM GOAL #2   Title  Patient will use her right arm to feed herself     Baseline  limited more by hand pain.     Status  Partially Met      PT LONG TERM GOAL #3   Title  Patient will tuck her shirt in behind her without increased pain with her right arm     Status  On-going            Plan - 03/19/19 1020    Clinical Impression Statement  Pt able to progress her therex some adding in UBE, UE ranger, and to increase from yellow band to red band with some scapular strengthening exercises. She shows good tolrance today and says her shoulder feels better after session. She says she will call neuo rehab office today about OT consult for her hand.     Rehab Potential  Good    PT Frequency  3x / week    PT Duration  12 weeks    PT Treatment/Interventions  ADLs/Self Care Home Management;Electrical Stimulation;Cryotherapy;Canalith Repostioning;Moist Heat;Ultrasound;DME Instruction;Gait training;Functional mobility training;Stair training;Therapeutic activities;Therapeutic exercise;Neuromuscular re-education;Patient/family education;Manual techniques;Passive range of motion;Taping    PT Next Visit Plan  continue with ROM, gentle strengthening ; consider finger ladder.    PT Home Exercise Plan  pendulum; towel squeeze; elbow pronation/supination, self PROM supine, tableslide PROM (sliding body away opposed to sliding arm fwd) added putty exercises and wrist flex/ext, pro/sup    Consulted and Agree with Plan of Care   Patient       Patient will benefit from skilled therapeutic intervention in order to improve the following deficits and impairments:  Pain, Decreased activity tolerance, Decreased endurance, Decreased strength, Decreased range of motion, Impaired UE functional use, Decreased knowledge of precautions, Decreased safety awareness  Visit Diagnosis: Acute pain of right shoulder  Muscle weakness (generalized)  Stiffness of right shoulder, not elsewhere classified  Pain in right hand     Problem List Patient Active Problem List   Diagnosis Date Noted  . Post-op pain 12/27/2016  . OSA (obstructive sleep apnea) 08/25/2014    [name] 03/19/2019, 10:23 AM  [facility] [address] [place], [place], [address] Phone: [phone]   Fax:  [phone]  Name: Kimberly Haynes MRN: [id number] Date of Birth: 1961/08/05

## 2019-03-25 ENCOUNTER — Other Ambulatory Visit: Payer: Self-pay

## 2019-03-25 ENCOUNTER — Ambulatory Visit: Payer: 59

## 2019-03-25 DIAGNOSIS — M6281 Muscle weakness (generalized): Secondary | ICD-10-CM | POA: Diagnosis not present

## 2019-03-25 DIAGNOSIS — M25511 Pain in right shoulder: Secondary | ICD-10-CM | POA: Diagnosis not present

## 2019-03-25 DIAGNOSIS — M25611 Stiffness of right shoulder, not elsewhere classified: Secondary | ICD-10-CM

## 2019-03-25 DIAGNOSIS — M79641 Pain in right hand: Secondary | ICD-10-CM | POA: Diagnosis not present

## 2019-03-25 NOTE — Therapy (Signed)
[place], [place], [address] Phone: [phone]   Fax:  [phone]  Physical Therapy Treatment  Patient Details  Name: Kimberly Haynes MRN: [id number] Date of Birth: 1961-05-20 Referring Provider (PT): Dr Ophelia Charter    Encounter Date: 03/25/2019  PT End of Session - 03/25/19 0937    Visit Number  22    Number of Visits  24    Date for PT Re-Evaluation  04/17/19    Authorization Type  [employer], no VL    PT Start Time  0935    PT Stop Time  1015   plus 10 min heat not added   PT Time Calculation (min)  40 min    Activity Tolerance  Patient tolerated treatment well    Behavior During Therapy  [name] for tasks assessed/performed       Past Medical History:  Diagnosis Date  . Bipolar disorder ([place])   . Depression   . Hearing impairment   . Sleep apnea    uses dental appliance    Past Surgical History:  Procedure Laterality Date  . APPENDECTOMY    . 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 ORIF HUMERUS FRACTURE Right 01/12/2019   Procedure: OPEN REDUCTION INTERNAL FIXATION (ORIF) PROXIMAL HUMERUS FRACTURE;  Surgeon: Hiram Gash, MD;  Location: [place];  Service: Orthopedics;  Laterality: Right;  . RADIOACTIVE SEED GUIDED EXCISIONAL BREAST BIOPSY Right 11/07/2016   Procedure: RADIOACTIVE SEED GUIDED EXCISIONAL BREAST BIOPSY;  Surgeon: Rolm Bookbinder, MD;  Location: [place];  Service: General;  Laterality: Right;  . TUBAL LIGATION      There were no vitals filed for this visit.  Subjective Assessment - 03/25/19 0938    Subjective  At rest no real pain more stiffness today. To see OT 04/03/19    Currently in Pain?  No/denies                       [facility] Adult PT Treatment/Exercise - 03/25/19 0001      Shoulder Exercises: Standing   External Rotation  Right;20 reps;Theraband    Theraband Level (Shoulder External Rotation)  Level 1 (Yellow)    Internal Rotation  Right;20 reps    Theraband Level (Shoulder Internal Rotation)  Level 1 (Yellow)    Flexion  Right;20 reps    Theraband Level (Shoulder Flexion)  Level 1 (Yellow)    Flexion Limitations  rockwood punch    ABduction  AROM;Right   2x10   Extension  Right;20 reps    Theraband Level (Shoulder Extension)  Level 2 (Red)    Row  Both;20 reps    Theraband Level (Shoulder Row)  Level 2 (Red)      Shoulder Exercises: Pulleys   Flexion  2 minutes    Scaption  2 minutes      Shoulder Exercises: ROM/Strengthening   UBE (Upper Arm Bike)  2 min fwd, 2 min retro, no reistance, L2    Ranger  flexion and scaption and circles X 15 ea      Shoulder Exercises: Stretch   Other Shoulder Stretches  ladder x 5 as high as able ( step 13 from top0      Moist Heat Therapy   Number Minutes Moist Heat  10 Minutes    Moist Heat Location  Hand;Shoulder  Cryotherapy   Number Minutes Cryotherapy  --    Cryotherapy Location  --    Type of Cryotherapy  --      Manual Therapy   Joint Mobilization  Grade II and III AP and inferior glides to improve ER; axilla     Soft tissue mobilization  anterior  shoulder    Passive ROM  Right shoulder flexion, abduction, ER emphasis, GH mobs gentle grade 1-2  inferior, distraction, A-P               PT Short Term Goals - 02/27/19 1153      PT SHORT TERM GOAL #1   Title  Patient will demonstrate passive shoulder flexion to 60 degrees     Baseline  96    Time  4    Period  Weeks    Status  Achieved    Target Date  02/20/19      PT SHORT TERM GOAL #2   Title  Patient will begin AAROM to the right shoulder ( per MD guidlines)    Baseline  will go to the MD next week     Time  4    Period  Weeks    Status  Achieved    Target Date  02/20/19      PT SHORT TERM GOAL #3   Title  Patient will demonstrate 30 degrees of passive  ROM     Baseline  17 degrees     Time  4    Period  Weeks    Status  Achieved        PT Long Term Goals - 03/25/19 0940      PT LONG TERM GOAL #1   Title  Patient will reach behind her head with right hand to brush her hair     Status  On-going      PT LONG TERM GOAL #2   Title  Patient will use her right arm to feed herself     Baseline  limited more by hand pain.     Status  On-going      PT LONG TERM GOAL #3   Title  Patient will tuck her shirt in behind her without increased pain with her right arm     Status  On-going            Plan - 03/25/19 4944    Clinical Impression Statement  Doing well and tolerates exercise and ROM but still quite stiff with abduction passive 90 degrees . Cont per plan ROM and strength    PT Treatment/Interventions  ADLs/Self Care Home Management;Electrical Stimulation;Cryotherapy;Canalith Repostioning;Moist Heat;Ultrasound;DME Instruction;Gait training;Functional mobility training;Stair training;Therapeutic activities;Therapeutic exercise;Neuromuscular re-education;Patient/family education;Manual techniques;Passive range of motion;Taping    PT Next Visit Plan  continue with ROM, gentle strengthening ; consider finger ladder.    PT Home Exercise Plan  pendulum; towel squeeze; elbow pronation/supination, self PROM supine, tableslide PROM (sliding body away opposed to sliding arm fwd) added putty exercises and wrist flex/ext, pro/sup    Consulted and Agree with Plan of Care  Patient       Patient will benefit from skilled therapeutic intervention in order to improve the following deficits and impairments:  Pain, Decreased activity tolerance, Decreased endurance, Decreased strength, Decreased range of motion, Impaired UE functional use, Decreased knowledge of precautions, Decreased safety awareness  Visit Diagnosis: Acute pain of right shoulder  Muscle weakness (generalized)  Stiffness of right shoulder, not elsewhere classified  Pain in  right hand  Problem List Patient Active Problem List   Diagnosis Date Noted  . Post-op pain 12/27/2016  . OSA (obstructive sleep apnea) 08/25/2014    Darrel Hoover  PT 03/25/2019, 10:14 AM  [facility] [address] [place], [place], [address] Phone: [phone]   Fax:  [phone]  Name: Kimberly Haynes MRN: [id number] Date of Birth: Jul 24, 1961

## 2019-03-27 ENCOUNTER — Ambulatory Visit: Payer: 59

## 2019-03-27 ENCOUNTER — Other Ambulatory Visit: Payer: Self-pay

## 2019-03-27 DIAGNOSIS — M79641 Pain in right hand: Secondary | ICD-10-CM | POA: Diagnosis not present

## 2019-03-27 DIAGNOSIS — M6281 Muscle weakness (generalized): Secondary | ICD-10-CM | POA: Diagnosis not present

## 2019-03-27 DIAGNOSIS — M25611 Stiffness of right shoulder, not elsewhere classified: Secondary | ICD-10-CM | POA: Diagnosis not present

## 2019-03-27 DIAGNOSIS — M25511 Pain in right shoulder: Secondary | ICD-10-CM

## 2019-03-27 NOTE — Therapy (Signed)
[facility] [place], [place], [address] Phone: [phone]   Fax:  [phone]  Physical Therapy Treatment  Patient Details  Name: Kimberly Haynes MRN: [id number] Date of Birth: 1961/10/12 Referring Provider (PT): Dr Ophelia Charter    Encounter Date: 03/27/2019  PT End of Session - 03/27/19 1024    Visit Number  23    Number of Visits  24    Date for PT Re-Evaluation  04/17/19    Authorization Type  [place] [employer], no VL    PT Start Time  1025   late   PT Stop Time  1115    PT Time Calculation (min)  50 min    Activity Tolerance  Patient tolerated treatment well    Behavior During Therapy  [facility] for tasks assessed/performed       Past Medical History:  Diagnosis Date  . Bipolar disorder ([place])   . Depression   . Hearing impairment   . Sleep apnea    uses dental appliance    Past Surgical History:  Procedure Laterality Date  . APPENDECTOMY    . 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 ORIF HUMERUS FRACTURE Right 01/12/2019   Procedure: OPEN REDUCTION INTERNAL FIXATION (ORIF) PROXIMAL HUMERUS FRACTURE;  Surgeon: Hiram Gash, MD;  Location: [place];  Service: Orthopedics;  Laterality: Right;  . RADIOACTIVE SEED GUIDED EXCISIONAL BREAST BIOPSY Right 11/07/2016   Procedure: RADIOACTIVE SEED GUIDED EXCISIONAL BREAST BIOPSY;  Surgeon: Rolm Bookbinder, MD;  Location: [facility];  Service: General;  Laterality: Right;  . TUBAL LIGATION      There were no vitals filed for this visit.  Subjective Assessment - 03/27/19 1026    Subjective  Mild pain . no complaints    Pain Score  2     Pain Location  Shoulder    Pain Orientation  Right    Pain Descriptors / Indicators  Aching;Sore    Pain Type  Chronic pain;Surgical pain    Pain Onset  More than a month ago    Pain Frequency   Intermittent    Aggravating Factors   using arm    Pain Relieving Factors  rest         [facility] PT Assessment - 03/27/19 0001      PROM   Left Shoulder Flexion  110 Degrees    Left Shoulder ABduction  103 Degrees    Left Shoulder Internal Rotation  70 Degrees   45 deg abduction   Left Shoulder External Rotation  28 Degrees   45 deg abduction                  [facility] Adult PT Treatment/Exercise - 03/27/19 0001      Shoulder Exercises: Standing   External Rotation  Right;15 reps    Theraband Level (Shoulder External Rotation)  Level 2 (Red)    Internal Rotation  Right;15 reps    Theraband Level (Shoulder Internal Rotation)  Level 2 (Red)    Flexion  Right;15 reps    Theraband Level (Shoulder Flexion)  Level 2 (Red)    Flexion Limitations  rockwood punch    Extension  Right;15 reps    Theraband Level (Shoulder Extension)  Level 2 (Red)      Shoulder Exercises: Pulleys   Flexion  2 minutes    Scaption  2 minutes      Shoulder Exercises: ROM/Strengthening   UBE (Upper Arm Bike)  3 min for 3 min back L1    [place]  flexion and scaption and circles X 15 ea      Shoulder Exercises: Stretch   Other Shoulder Stretches  ladder x 5 as high as able ( step 13 from top0      Moist Heat Therapy   Number Minutes Moist Heat  10 Minutes    Moist Heat Location  Shoulder      Manual Therapy   Joint Mobilization  Grade II and III AP and inferior glides to improve ER; axilla     Soft tissue mobilization  anterior  shoulder    Passive ROM  Right shoulder flexion, abduction, ER emphasis, GH mobs gentle grade 1-2  inferior, distraction, A-P             PT Education - 03/27/19 1107    Education Details  reviewed the ROM measures    Person(s) Educated  Patient    Methods  Explanation    Comprehension  Verbalized understanding       PT Short Term Goals - 02/27/19 1153      PT SHORT TERM GOAL #1   Title  Patient will demonstrate passive shoulder flexion to 60 degrees      Baseline  96    Time  4    Period  Weeks    Status  Achieved    Target Date  02/20/19      PT SHORT TERM GOAL #2   Title  Patient will begin AAROM to the right shoulder ( per MD guidlines)    Baseline  will go to the MD next week     Time  4    Period  Weeks    Status  Achieved    Target Date  02/20/19      PT SHORT TERM GOAL #3   Title  Patient will demonstrate 30 degrees of passive ROM     Baseline  17 degrees     Time  4    Period  Weeks    Status  Achieved        PT Long Term Goals - 03/27/19 1108      PT LONG TERM GOAL #1   Title  Patient will reach behind her head with right hand to brush her hair     Baseline  still need sto moidify due to RT hand pain.     Period  Weeks    Status  On-going            Plan - 03/27/19 1026    Clinical Impression Statement  Still tolerateing stretching though still going easy to not flare up pain. ROM slightly improved.   MD visit 04/08/19    PT Treatment/Interventions  ADLs/Self Care Home Management;Electrical Stimulation;Cryotherapy;Canalith Repostioning;Moist Heat;Ultrasound;DME Instruction;Gait training;Functional mobility training;Stair training;Therapeutic activities;Therapeutic exercise;Neuromuscular re-education;Patient/family education;Manual techniques;Passive range of motion;Taping    PT Next Visit Plan  continue with ROM, gentle strengthening     MD appointment 04/08/19    PT Home Exercise Plan  pendulum; towel squeeze; elbow pronation/supination, self PROM supine, tableslide PROM (sliding body away opposed to sliding arm fwd) added putty exercises and wrist flex/ext, pro/sup    Consulted and Agree with Plan of Care  Patient       Patient will benefit from skilled therapeutic intervention in order to improve the following deficits and impairments:  Pain, Decreased  activity tolerance, Decreased endurance, Decreased strength, Decreased range of motion, Impaired UE functional use, Decreased knowledge of precautions,  Decreased safety awareness  Visit Diagnosis: Acute pain of right shoulder  Muscle weakness (generalized)  Stiffness of right shoulder, not elsewhere classified  Pain in right hand     Problem List Patient Active Problem List   Diagnosis Date Noted  . Post-op pain 12/27/2016  . OSA (obstructive sleep apnea) 08/25/2014    Darrel Hoover  PT 03/27/2019, 11:09 AM  [facility] [address] [place], [place], [address] Phone: [phone]   Fax:  [phone]  Name: Jaida Basurto MRN: [id number] Date of Birth: 1961-08-13

## 2019-03-31 ENCOUNTER — Encounter: Payer: Self-pay | Admitting: Occupational Therapy

## 2019-03-31 ENCOUNTER — Other Ambulatory Visit: Payer: Self-pay

## 2019-03-31 ENCOUNTER — Ambulatory Visit: Payer: 59 | Attending: Orthopaedic Surgery | Admitting: Occupational Therapy

## 2019-03-31 DIAGNOSIS — M25531 Pain in right wrist: Secondary | ICD-10-CM | POA: Insufficient documentation

## 2019-03-31 DIAGNOSIS — M79641 Pain in right hand: Secondary | ICD-10-CM | POA: Insufficient documentation

## 2019-03-31 DIAGNOSIS — R208 Other disturbances of skin sensation: Secondary | ICD-10-CM | POA: Diagnosis not present

## 2019-03-31 DIAGNOSIS — M25631 Stiffness of right wrist, not elsewhere classified: Secondary | ICD-10-CM | POA: Diagnosis not present

## 2019-03-31 DIAGNOSIS — M6281 Muscle weakness (generalized): Secondary | ICD-10-CM | POA: Insufficient documentation

## 2019-03-31 DIAGNOSIS — M25641 Stiffness of right hand, not elsewhere classified: Secondary | ICD-10-CM | POA: Insufficient documentation

## 2019-03-31 DIAGNOSIS — M25511 Pain in right shoulder: Secondary | ICD-10-CM | POA: Insufficient documentation

## 2019-03-31 DIAGNOSIS — G4733 Obstructive sleep apnea (adult) (pediatric): Secondary | ICD-10-CM | POA: Diagnosis not present

## 2019-03-31 DIAGNOSIS — M25611 Stiffness of right shoulder, not elsewhere classified: Secondary | ICD-10-CM | POA: Diagnosis not present

## 2019-03-31 NOTE — Patient Instructions (Signed)
Flexor Tendon Gliding (Active Hook Fist)   With fingers and knuckles straight, bend middle and tip joints. Do not bend large knuckles. Repeat 10 times. Do 3 sessions per day.   Flexor Tendon Gliding (Active Full Fist)   Straighten all fingers, then make a fist, bending all joints. Repeat 10 times. Do 3sessions per day.   MP Flexion (Active Isolated)   Bend ALL fingers at large knuckle, keeping other fingers straight. Do not bend tips. Repeat 10 times. Do 3 sessions per day.  Finger Opposition    Actively touch right thumb to each fingertip. Start with index finger and proceed toward little finger. Move slowly at first, then more rapidly as motion and coordination improve. Be sure to touch each fingertip. Repeat 10 times per set.  Do 3 sessions per day.  MP / PIP / DIP Composite Flexion (Passive Stretch)    Use other hand to bend all finger at all three joints. Hold 5 seconds.   Then release and try to keep right fingers in a fist 5sec. Repeat 10 times. Do 3 sessions per day.   PIP / DIP Composite Flexion (Passive Stretch)    Use other hand to bend middle and tip joints of each finger. Hold 5 seconds. Repeat 10 times. Do 3 sessions per day.     Composite Extension (Passive Flexor Stretch)    Sitting with elbows on table and palms together, slowly lower wrists toward table until stretch is felt. Be sure to keep palms together throughout stretch. Hold 15-20 seconds. Relax. Repeat 5 times. Do 3 sessions per day.

## 2019-03-31 NOTE — Therapy (Signed)
[facility] [address] [place], [place], [address] Phone: [phone]   Fax:  [phone]  Occupational Therapy Evaluation  Patient Details  Name: Kimberly Haynes MRN: [id number] Date of Birth: Nov 05, 1960 Referring Provider (OT): Dr. Ophelia Charter   Encounter Date: 03/31/2019  OT End of Session - 03/31/19 1025    Visit Number  1    Number of Visits  13    Date for OT Re-Evaluation  05/15/19    Authorization Type  [employer] [place] [employer], no visit limit/authorization required    OT Start Time  0902    OT Stop Time  0955    OT Time Calculation (min)  53 min    Activity Tolerance  Patient tolerated treatment well    Behavior During Therapy  [facility] for tasks assessed/performed       Past Medical History:  Diagnosis Date  . Bipolar disorder ([place])   . Depression   . Hearing impairment   . Sleep apnea    uses dental appliance    Past Surgical History:  Procedure Laterality Date  . APPENDECTOMY    . 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 ORIF HUMERUS FRACTURE Right 01/12/2019   Procedure: OPEN REDUCTION INTERNAL FIXATION (ORIF) PROXIMAL HUMERUS FRACTURE;  Surgeon: Hiram Gash, MD;  Location: [place] [facility];  Service: Orthopedics;  Laterality: Right;  . RADIOACTIVE SEED GUIDED EXCISIONAL BREAST BIOPSY Right 11/07/2016   Procedure: RADIOACTIVE SEED GUIDED EXCISIONAL BREAST BIOPSY;  Surgeon: Rolm Bookbinder, MD;  Location: [facility];  Service: General;  Laterality: Right;  . TUBAL LIGATION      There were no vitals filed for this visit.  Subjective Assessment - 03/31/19 0906    Subjective   Pt reports next MD appt 6/9    Pertinent History  s/p R humeral fx and ORIF on 01/12/2019 (injured 01/11/19 with dislocation per pt) see MD next 6/9; hearing impaired; depression, sleep apnea, bipolar  disorder    Limitations  s/p R humeral fx and ORIF on 01/12/2019 (injured 01/11/19) see MD next 6/9 (arm in sling x6 weeks); hearing impaired    Patient Stated Goals  be able to close hand, decr pain    Currently in Pain?  Yes   none at rest   Pain Score  5    up to 8/10 pain at times   Pain Location  --   R shoulder, hand, wrist   Pain Orientation  Right    Pain Descriptors / Indicators  Aching;Sore    Pain Type  Surgical pain;Chronic pain    Pain Onset  More than a month ago    Pain Frequency  Intermittent    Aggravating Factors   using arm/hand    Pain Relieving Factors  rest, OTC pain meds.  Ortho PT addressing R shoulder pain.        [facility] OT Assessment - 03/31/19 0001      Assessment   Medical Diagnosis  hand stiffness, decr ROM s/p R humeral fx and ORIF     Referring Provider (OT)  Dr. Ophelia Charter    Onset Date/Surgical Date  01/12/19   surgery, injury 01/11/19   Hand Dominance  Right    Next MD Visit  Dr. Ophelia Charter    Prior Therapy  current Ortho PT for R shoulder fx  Precautions   Precaution Comments  no lifting over 2lbs      Restrictions   Weight Bearing Restrictions  No      Balance Screen   Has the patient fallen in the past 6 months  Yes    How many times?  1   going up steps     Home  Environment   Family/patient expects to be discharged to:  Private residence    Lives With  Spouse   goes to dtr's house to help with 4 grandchildren during the      Prior Function   Level of Independence  Independent    Leisure  helps to care for 4 grandchildren      ADL   Eating/Feeding  Needs assist with cutting food   using RUE approx less than 50%   Grooming  --   using LUE    Upper Body Bathing  --   using LUE to wash hair   Lower Body Bathing  --   using primarily with LUE, dtr helps to   Upper Body Dressing  --   mod I for shirt, unable to don bra   Lower Body Dressing  Needs assist for fasteners   pulling up pants with LUE   Toilet Transfer   Independent   LUE   Toileting - Clothing Manipulation  Modified independent    [place] Transfer  Modified independent    Transfers/Ambulation Related to ADL's  independent      IADL   Prior Level of Function Meal Prep  needs assist for cutting/chopping, cooks with LUE    Community Mobility  Drives own vehicle   with LUE     Mobility   Mobility Status  Independent      Written Expression   Dominant Hand  Right    Handwriting  --   difficulty/pain     Sensation   Light Touch  Impaired by gross assessment    Additional Comments  numbness in palm/digits 3-5      Coordination   Fine Motor Movements are Fluid and Coordinated  No      Edema   Edema  moderate      ROM / Strength   AROM / PROM / Strength  AROM      AROM   Overall AROM   Deficits    Overall AROM Comments  gross finger extension 90%, finger ext grossly 50%, but able to oppose to each digit, decr thumb IP flex to oppose to base of 5th digit, difficult with finger abduction    AROM Assessment Site  Wrist;Forearm    Right/Left Forearm  Right    Right Forearm Pronation  95 Degrees    Right Forearm Supination  60 Degrees    Right/Left Wrist  Left    Right Wrist Extension  40 Degrees    Right Wrist Flexion  75 Degrees    Left Wrist Extension  70 Degrees    Left Wrist Flexion  75 Degrees      Hand Function   Right Hand Grip (lbs)  13    Right Hand Lateral Pinch  5.5 lbs    Right Hand 3 Point Pinch  4 lbs    Left Hand Grip (lbs)  54    Left Hand Lateral Pinch  17 lbs    Left 3 point pinch  11.5 lbs  OT Education - 03/31/19 1006    Education Details  Initial HEP for ROM--see pt instructions    Person(s) Educated  Patient    Methods  Explanation;Demonstration;Verbal cues;Handout    Comprehension  Verbalized understanding;Returned demonstration;Verbal cues required       OT Short Term Goals - 03/31/19 1035      OT SHORT  TERM GOAL #1   Title  Pt will be independent with HEP for R hand/wrist ROM.--check STGs 04/30/19    Time  4    Period  Weeks    Status  New      OT SHORT TERM GOAL #2   Title  Pt will demo at least 90% R finger flex for improved grasp of objects.    Baseline  50%    Time  4    Period  Weeks    Status  New      OT SHORT TERM GOAL #3   Title  Pt will demo at least 60* R wrist extension for ADLs.    Baseline  40*    Period  Weeks    Status  New      OT SHORT TERM GOAL #4   Title  Pt will report R hand/wrist pain less than or equal to 4/10 for BADLs.    Time  4    Period  Weeks    Status  New      OT SHORT TERM GOAL #5   Title  Pt will use RUE as dominant UE for BADLs at least 50% of the time.    Time  4    Period  Weeks    Status  New        OT Long Term Goals - 03/31/19 1039      OT LONG TERM GOAL #1   Title  Pt will be independent with stengthening HEP for R hand/wrist.--check LTGs 05/30/19    Time  8    Period  Weeks    Status  New      OT LONG TERM GOAL #2   Title  Pt will demo at least 35lbs R grip strength for opening containers/picking up objects.    Baseline  13lbs    Time  8    Period  Weeks    Status  New      OT LONG TERM GOAL #3   Title  Pt will report pain in R hand/wrist less than or equal to 3/10 for light ADLs.    Time  8    Period  Weeks    Status  New      OT LONG TERM GOAL #4   Title  Pt will use RUE as dominant UE for ADLs at least 90% of the time.    Time  8    Period  Weeks    Status  New      OT LONG TERM GOAL #5   Title  Pt will demo at least 5lb improvement in lateral pinch strength and at least 4lb improvement in 3point pinch strength for ADLs.    Baseline  lateral 5.5lbs, 3point 4lbs    Time  8    Period  Weeks    Status  New            Plan - 03/31/19 1028    Clinical Impression Statement  Pt is a 58 y.o. female who presents with stiffness/decr ROM in R hand s/p R humberal fx and ORIF 01/12/19 (injury 01/11/19).  Pt  reports that she dislocated shouler 01/11/19 after fall.  Pt reports that she wore a sling for 6 weeks.  Pt with PMH that includes depression, sleep apnea, bipolar disorder, and hearing impairment.  Pt presents today with decr ROM, decr strength, decr R dominant hand functional use, pain, edema, and decr sensation R hand (Ortho PT addressing R shoulder limitations).  Pt would benefit from occupational therapy to address R hand/wrist deficits for improved dominant RUE functional use.    OT Occupational Profile and History  Problem Focused Assessment - Including review of records relating to presenting problem    Occupational performance deficits (Please refer to evaluation for details):  ADL's;IADL's;Rest and Sleep;Leisure;Social Participation    Body Structure / Function / Physical Skills  ADL;Coordination;UE functional use;IADL;Pain;FMC;Strength;ROM;Edema;Sensation    Rehab Potential  Good    Clinical Decision Making  Several treatment options, min-mod task modification necessary    Comorbidities Affecting Occupational Performance:  May have comorbidities impacting occupational performance    Modification or Assistance to Complete Evaluation   No modification of tasks or assist necessary to complete eval    OT Frequency  2x / week    OT Duration  8 weeks   +eval   OT Treatment/Interventions  Self-care/ADL training;Electrical Stimulation;Therapeutic exercise;Patient/family education;Splinting;Neuromuscular education;Paraffin;Moist Heat;Fluidtherapy;Energy conservation;Therapeutic activities;Passive range of motion;Manual Therapy;DME and/or AE instruction;Contrast Bath;Ultrasound;Cryotherapy    Plan  review initial HEP, update prn, work on ROM including supination, wrist ext, and finger flex/ext, fludio    OT Home Exercise Plan  Education provided:  initial HEP 6/1    Recommended Other Services  seeing ortho PT for R shoulder    Consulted and Agree with Plan of Care  Patient       Patient will  benefit from skilled therapeutic intervention in order to improve the following deficits and impairments:  Body Structure / Function / Physical Skills  Visit Diagnosis: Muscle weakness (generalized)  Pain in right hand  Stiffness of right hand, not elsewhere classified  Stiffness of right wrist, not elsewhere classified  Pain in right wrist  Other disturbances of skin sensation    Problem List Patient Active Problem List   Diagnosis Date Noted  . Post-op pain 12/27/2016  . OSA (obstructive sleep apnea) 08/25/2014    [facility] 03/31/2019, 10:43 AM  [facility] [address] [place], [place], [address] Phone: [phone]   Fax:  [phone]  Name: Lucillie Kiesel MRN: [id number] Date of Birth: [birth year]   Vianne Bulls, OTR/L [facility] [address] [place], [place]  [address] [phone] phone [phone] 03/31/19 10:43 AM

## 2019-04-02 ENCOUNTER — Ambulatory Visit: Payer: 59

## 2019-04-02 ENCOUNTER — Other Ambulatory Visit: Payer: Self-pay

## 2019-04-02 DIAGNOSIS — R208 Other disturbances of skin sensation: Secondary | ICD-10-CM | POA: Diagnosis not present

## 2019-04-02 DIAGNOSIS — M25511 Pain in right shoulder: Secondary | ICD-10-CM

## 2019-04-02 DIAGNOSIS — M25631 Stiffness of right wrist, not elsewhere classified: Secondary | ICD-10-CM | POA: Diagnosis not present

## 2019-04-02 DIAGNOSIS — M79641 Pain in right hand: Secondary | ICD-10-CM | POA: Diagnosis not present

## 2019-04-02 DIAGNOSIS — M6281 Muscle weakness (generalized): Secondary | ICD-10-CM

## 2019-04-02 DIAGNOSIS — M25641 Stiffness of right hand, not elsewhere classified: Secondary | ICD-10-CM | POA: Diagnosis not present

## 2019-04-02 DIAGNOSIS — M25611 Stiffness of right shoulder, not elsewhere classified: Secondary | ICD-10-CM

## 2019-04-02 DIAGNOSIS — M25531 Pain in right wrist: Secondary | ICD-10-CM | POA: Diagnosis not present

## 2019-04-02 MED FILL — buPROPion HCL ER (XL) 300 M: 300 | 90 days supply | Qty: 90 | Fill #0

## 2019-04-02 NOTE — Therapy (Signed)
[place], [place], [address] Phone: [phone]   Fax:  [phone]  Physical Therapy Treatment  Patient Details  Name: Kimberly Haynes MRN: [id number] Date of Birth: 27-Oct-1961 Referring Provider (PT): Dr Ophelia Charter    Encounter Date: 04/02/2019  PT End of Session - 04/02/19 0953    Visit Number  24    Number of Visits  36    Date for PT Re-Evaluation  05/30/19    Authorization Type  [employer], no VL    PT Start Time  0948    PT Stop Time  1040    PT Time Calculation (min)  52 min    Activity Tolerance  Patient tolerated treatment well    Behavior During Therapy  [facility] for tasks assessed/performed       Past Medical History:  Diagnosis Date  . Bipolar disorder ([place])   . Depression   . Hearing impairment   . Sleep apnea    uses dental appliance    Past Surgical History:  Procedure Laterality Date  . APPENDECTOMY    . 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 ORIF HUMERUS FRACTURE Right 01/12/2019   Procedure: OPEN REDUCTION INTERNAL FIXATION (ORIF) PROXIMAL HUMERUS FRACTURE;  Surgeon: Hiram Gash, MD;  Location: [place];  Service: Orthopedics;  Laterality: Right;  . RADIOACTIVE SEED GUIDED EXCISIONAL BREAST BIOPSY Right 11/07/2016   Procedure: RADIOACTIVE SEED GUIDED EXCISIONAL BREAST BIOPSY;  Surgeon: Rolm Bookbinder, MD;  Location: [place];  Service: General;  Laterality: Right;  . TUBAL LIGATION      There were no vitals filed for this visit.  Subjective Assessment - 04/02/19 0950    Subjective  No shoulder pain at rest.   OT appointment went well . May only go a couple of times if I think HEP is helping    Currently in Pain?  No/denies         [facility] PT Assessment - 04/02/19 0001      AROM   AROM Assessment Site  Shoulder    Right/Left  Shoulder  Right    Right Shoulder Extension  40 Degrees    Right Shoulder Flexion  85 Degrees    Right Shoulder ABduction  75 Degrees    Right Shoulder Internal Rotation  32 Degrees    Right Shoulder External Rotation  35 Degrees      PROM   Right/Left Shoulder  Right    Right Shoulder Extension  45 Degrees   sitting   Right Shoulder Flexion  110 Degrees   sitting   Right Shoulder ABduction  100 Degrees   sitting   Right Shoulder Internal Rotation  35 Degrees   shoulder 45 degree abduction   Right Shoulder External Rotation  42 Degrees   sitting     Strength   Overall Strength Comments  She was able to give resistance all directions but encourage to not give full resistance and npain                   [facility] Adult PT Treatment/Exercise - 04/02/19 0001      Shoulder Exercises: Supine   Other Supine Exercises  AAROM ER , Abduction with ER,  protraction       Shoulder Exercises: Standing   External Rotation  Right;15 reps  Theraband Level (Shoulder External Rotation)  Level 2 (Red)    Internal Rotation  Right;15 reps    Theraband Level (Shoulder Internal Rotation)  Level 2 (Red)    Flexion  Right;15 reps    Theraband Level (Shoulder Flexion)  Level 2 (Red)    Flexion Limitations  rockwood punch    Extension  Right;15 reps    Theraband Level (Shoulder Extension)  Level 2 (Red)    Row  Both;20 reps    Theraband Level (Shoulder Row)  Level 2 (Red)      Shoulder Exercises: Pulleys   Flexion  2 minutes      Shoulder Exercises: ROM/Strengthening   UBE (Upper Arm Bike)  3 min for 3 min back L1      Shoulder Exercises: Stretch   Other Shoulder Stretches  ladder x 5 as high as able ( step 13 from top0      Manual Therapy   Joint Mobilization  Grade II and III AP and inferior glides to improve ER; axilla     Soft tissue mobilization  anterior  shoulder    Passive ROM  Right shoulder flexion, abduction, ER emphasis, GH mobs gentle grade 1-2  inferior, distraction,  A-P               PT Short Term Goals - 02/27/19 1153      PT SHORT TERM GOAL #1   Title  Patient will demonstrate passive shoulder flexion to 60 degrees     Baseline  96    Time  4    Period  Weeks    Status  Achieved    Target Date  02/20/19      PT SHORT TERM GOAL #2   Title  Patient will begin AAROM to the right shoulder ( per MD guidlines)    Baseline  will go to the MD next week     Time  4    Period  Weeks    Status  Achieved    Target Date  02/20/19      PT SHORT TERM GOAL #3   Title  Patient will demonstrate 30 degrees of passive ROM     Baseline  17 degrees     Time  4    Period  Weeks    Status  Achieved        PT Long Term Goals - 04/02/19 1032      PT LONG TERM GOAL #1   Title  Patient will reach behind her head with right hand to brush her hair     Baseline  still need sto moidify due to RT hand pain.     Time  12    Period  Weeks    Status  On-going      PT LONG TERM GOAL #2   Title  Patient will use her right arm to feed herself     Baseline  limited more by hand pain.     Status  On-going      PT LONG TERM GOAL #3   Title  Patient will tuck her shirt in behind her without increased pain with her right arm     Status  On-going            Plan - 04/02/19 0954    Clinical Impression Statement  Progressing slowly with ROM and strength. Pain fairly well controlled.  We will plan on at least 4 more weeks . She will see Dr Griffin Basil next  week and hopefully he will give instructions on limitations moving forward.      PT Frequency  2x / week    PT Duration  6 weeks    PT Treatment/Interventions  ADLs/Self Care Home Management;Electrical Stimulation;Cryotherapy;Canalith Repostioning;Moist Heat;Ultrasound;DME Instruction;Gait training;Functional mobility training;Stair training;Therapeutic activities;Therapeutic exercise;Neuromuscular re-education;Patient/family education;Manual techniques;Passive range of motion;Taping    PT Next Visit Plan   continue with ROM, gentle strengthening     MD appointment 04/08/19 and will follow any guidlines.     PT Home Exercise Plan  pendulum; towel squeeze; elbow pronation/supination, self PROM supine, tableslide PROM (sliding body away opposed to sliding arm fwd) added putty exercises and wrist flex/ext, pro/sup    Consulted and Agree with Plan of Care  Patient       Patient will benefit from skilled therapeutic intervention in order to improve the following deficits and impairments:  Pain, Decreased activity tolerance, Decreased endurance, Decreased strength, Decreased range of motion, Impaired UE functional use, Decreased knowledge of precautions, Decreased safety awareness  Visit Diagnosis: Muscle weakness (generalized)  Acute pain of right shoulder  Stiffness of right shoulder, not elsewhere classified     Problem List Patient Active Problem List   Diagnosis Date Noted  . Post-op pain 12/27/2016  . OSA (obstructive sleep apnea) 08/25/2014    Darrel Hoover  PT 04/02/2019, 10:35 AM  [facility] [address] [place], [place], [address] Phone: [phone]   Fax:  [phone]  Name: Kimberly Haynes MRN: [id number] Date of Birth: 1961-08-24

## 2019-04-03 MED FILL — OXcarbazepine 300 MG TABS: 300 | 31 days supply | Qty: 220 | Fill #0

## 2019-04-04 ENCOUNTER — Other Ambulatory Visit: Payer: Self-pay

## 2019-04-04 ENCOUNTER — Ambulatory Visit: Payer: 59

## 2019-04-04 DIAGNOSIS — M25611 Stiffness of right shoulder, not elsewhere classified: Secondary | ICD-10-CM | POA: Diagnosis not present

## 2019-04-04 DIAGNOSIS — M6281 Muscle weakness (generalized): Secondary | ICD-10-CM

## 2019-04-04 DIAGNOSIS — M79641 Pain in right hand: Secondary | ICD-10-CM | POA: Diagnosis not present

## 2019-04-04 DIAGNOSIS — M25631 Stiffness of right wrist, not elsewhere classified: Secondary | ICD-10-CM | POA: Diagnosis not present

## 2019-04-04 DIAGNOSIS — M25511 Pain in right shoulder: Secondary | ICD-10-CM | POA: Diagnosis not present

## 2019-04-04 DIAGNOSIS — M25641 Stiffness of right hand, not elsewhere classified: Secondary | ICD-10-CM | POA: Diagnosis not present

## 2019-04-04 DIAGNOSIS — M25531 Pain in right wrist: Secondary | ICD-10-CM | POA: Diagnosis not present

## 2019-04-04 DIAGNOSIS — R208 Other disturbances of skin sensation: Secondary | ICD-10-CM | POA: Diagnosis not present

## 2019-04-04 NOTE — Therapy (Signed)
[place], [place], [address] Phone: [phone]   Fax:  [phone]  Physical Therapy Treatment  Patient Details  Name: Kimberly Haynes MRN: [id number] Date of Birth: 12-08-1960 Referring Provider (PT): Dr Ophelia Charter    Encounter Date: 04/04/2019  PT End of Session - 04/04/19 1000    Visit Number  25    Number of Visits  36    Date for PT Re-Evaluation  05/30/19    Authorization Type  [employer], no VL    PT Start Time  0917    PT Stop Time  1013    PT Time Calculation (min)  56 min    Activity Tolerance  Patient tolerated treatment well    Behavior During Therapy  [facility] for tasks assessed/performed       Past Medical History:  Diagnosis Date  . Bipolar disorder ([place])   . Depression   . Hearing impairment   . Sleep apnea    uses dental appliance    Past Surgical History:  Procedure Laterality Date  . APPENDECTOMY    . 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 ORIF HUMERUS FRACTURE Right 01/12/2019   Procedure: OPEN REDUCTION INTERNAL FIXATION (ORIF) PROXIMAL HUMERUS FRACTURE;  Surgeon: Hiram Gash, MD;  Location: [place];  Service: Orthopedics;  Laterality: Right;  . RADIOACTIVE SEED GUIDED EXCISIONAL BREAST BIOPSY Right 11/07/2016   Procedure: RADIOACTIVE SEED GUIDED EXCISIONAL BREAST BIOPSY;  Surgeon: Rolm Bookbinder, MD;  Location: [place];  Service: General;  Laterality: Right;  . TUBAL LIGATION      There were no vitals filed for this visit.  Subjective Assessment - 04/04/19 0917    Subjective  No pain    Currently in Pain?  No/denies                       [facility] Adult PT Treatment/Exercise - 04/04/19 0001      Shoulder Exercises: Standing   External Rotation  Right;15 reps    Theraband Level (Shoulder External Rotation)   Level 2 (Red)    Internal Rotation  Right;15 reps    Theraband Level (Shoulder Internal Rotation)  Level 2 (Red)    Flexion  Right;15 reps    Theraband Level (Shoulder Flexion)  Level 2 (Red)    Flexion Limitations  rockwood punch    Extension  Right;15 reps    Theraband Level (Shoulder Extension)  Level 2 (Red)    Row  Both;20 reps    Theraband Level (Shoulder Row)  Level 2 (Red)      Shoulder Exercises: Pulleys   Flexion  2 minutes      Shoulder Exercises: ROM/Strengthening   UBE (Upper Arm Bike)  3 min for 3 min back L1      Moist Heat Therapy   Number Minutes Moist Heat  10 Minutes    Moist Heat Location  Shoulder      Manual Therapy   Joint Mobilization  Grade II and III AP and inferior glides to improve ER; axilla     Soft tissue mobilization  anterior  shoulder    Passive ROM  Right shoulder flexion, abduction, ER emphasis, GH mobs gentle grade 1-2  inferior, distraction, A-P               PT Short  Term Goals - 02/27/19 1153      PT SHORT TERM GOAL #1   Title  Patient will demonstrate passive shoulder flexion to 60 degrees     Baseline  96    Time  4    Period  Weeks    Status  Achieved    Target Date  02/20/19      PT SHORT TERM GOAL #2   Title  Patient will begin AAROM to the right shoulder ( per MD guidlines)    Baseline  will go to the MD next week     Time  4    Period  Weeks    Status  Achieved    Target Date  02/20/19      PT SHORT TERM GOAL #3   Title  Patient will demonstrate 30 degrees of passive ROM     Baseline  17 degrees     Time  4    Period  Weeks    Status  Achieved        PT Long Term Goals - 04/02/19 1032      PT LONG TERM GOAL #1   Title  Patient will reach behind her head with right hand to brush her hair     Baseline  still need sto moidify due to RT hand pain.     Time  12    Period  Weeks    Status  On-going      PT LONG TERM GOAL #2   Title  Patient will use her right arm to feed herself     Baseline  limited  more by hand pain.     Status  On-going      PT LONG TERM GOAL #3   Title  Patient will tuck her shirt in behind her without increased pain with her right arm     Status  On-going            Plan - 04/04/19 1001    Clinical Impression Statement  She appears to be improving into scaption and tolerates moderate stretching.   progress is slow but contiunues    PT Treatment/Interventions  ADLs/Self Care Home Management;Electrical Stimulation;Cryotherapy;Canalith Repostioning;Moist Heat;Ultrasound;DME Instruction;Gait training;Functional mobility training;Stair training;Therapeutic activities;Therapeutic exercise;Neuromuscular re-education;Patient/family education;Manual techniques;Passive range of motion;Taping    PT Home Exercise Plan  pendulum; towel squeeze; elbow pronation/supination, self PROM supine, tableslide PROM (sliding body away opposed to sliding arm fwd) added putty exercises and wrist flex/ext, pro/sup    Consulted and Agree with Plan of Care  Patient       Patient will benefit from skilled therapeutic intervention in order to improve the following deficits and impairments:  Pain, Decreased activity tolerance, Decreased endurance, Decreased strength, Decreased range of motion, Impaired UE functional use, Decreased knowledge of precautions, Decreased safety awareness  Visit Diagnosis: Muscle weakness (generalized)  Stiffness of right shoulder, not elsewhere classified     Problem List Patient Active Problem List   Diagnosis Date Noted  . Post-op pain 12/27/2016  . OSA (obstructive sleep apnea) 08/25/2014    Darrel Hoover  PT 04/04/2019, 10:03 AM  [facility] [address] [place], [place], [address] Phone: [phone]   Fax:  [phone]  Name: Eleisha Branscomb MRN: [id number] Date of Birth: [birth year]

## 2019-04-08 DIAGNOSIS — S42291D Other displaced fracture of upper end of right humerus, subsequent encounter for fracture with routine healing: Secondary | ICD-10-CM | POA: Diagnosis not present

## 2019-04-09 ENCOUNTER — Ambulatory Visit: Payer: 59

## 2019-04-09 ENCOUNTER — Other Ambulatory Visit: Payer: Self-pay

## 2019-04-09 DIAGNOSIS — R208 Other disturbances of skin sensation: Secondary | ICD-10-CM | POA: Diagnosis not present

## 2019-04-09 DIAGNOSIS — M25611 Stiffness of right shoulder, not elsewhere classified: Secondary | ICD-10-CM | POA: Diagnosis not present

## 2019-04-09 DIAGNOSIS — M25641 Stiffness of right hand, not elsewhere classified: Secondary | ICD-10-CM | POA: Diagnosis not present

## 2019-04-09 DIAGNOSIS — M79641 Pain in right hand: Secondary | ICD-10-CM | POA: Diagnosis not present

## 2019-04-09 DIAGNOSIS — M25631 Stiffness of right wrist, not elsewhere classified: Secondary | ICD-10-CM | POA: Diagnosis not present

## 2019-04-09 DIAGNOSIS — M25511 Pain in right shoulder: Secondary | ICD-10-CM | POA: Diagnosis not present

## 2019-04-09 DIAGNOSIS — M6281 Muscle weakness (generalized): Secondary | ICD-10-CM

## 2019-04-09 DIAGNOSIS — M25531 Pain in right wrist: Secondary | ICD-10-CM | POA: Diagnosis not present

## 2019-04-09 NOTE — Therapy (Signed)
[place], [place], [address] Phone: [phone]   Fax:  [phone]  Physical Therapy Treatment  Patient Details  Name: Kimberly Haynes MRN: [id number] Date of Birth: 1961/06/01 Referring Provider (PT): Dr Ophelia Charter    Encounter Date: 04/09/2019  PT End of Session - 04/09/19 0949    Visit Number  26    Number of Visits  36    Date for PT Re-Evaluation  05/30/19    Authorization Type  [employer], no VL    PT Start Time  0947    PT Stop Time  1050    PT Time Calculation (min)  63 min    Activity Tolerance  Patient tolerated treatment well    Behavior During Therapy  [facility] for tasks assessed/performed       Past Medical History:  Diagnosis Date  . Bipolar disorder ([place])   . Depression   . Hearing impairment   . Sleep apnea    uses dental appliance    Past Surgical History:  Procedure Laterality Date  . APPENDECTOMY    . COCHLEAR IMPLANT Left 12/27/2016   Procedure: COCHLEAR IMPLANT LEFT EAR;  Surgeon: Vicie Mutters, MD;  Location: [place];  Service: ENT;  Laterality: Left;  . ORIF HUMERUS FRACTURE Right 01/12/2019   Procedure: OPEN REDUCTION INTERNAL FIXATION (ORIF) PROXIMAL HUMERUS FRACTURE;  Surgeon: Hiram Gash, MD;  Location: [place];  Service: Orthopedics;  Laterality: Right;  . RADIOACTIVE SEED GUIDED EXCISIONAL BREAST BIOPSY Right 11/07/2016   Procedure: RADIOACTIVE SEED GUIDED EXCISIONAL BREAST BIOPSY;  Surgeon: Rolm Bookbinder, MD;  Location: [place];  Service: General;  Laterality: Right;  . TUBAL LIGATION      There were no vitals filed for this visit.  Subjective Assessment - 04/09/19 0949    Subjective  MD cleared for all tolerated activity.  MD said ROM may be limited but though she was moving arm OK. No pain at rest    Currently in Pain?  No/denies                       [facility] Adult PT Treatment/Exercise - 04/09/19 0001      Shoulder Exercises: Standing   External Rotation  Right;20 reps    Theraband Level (Shoulder External Rotation)  Level 3 (Green)    Internal Rotation  Right;20 reps    Theraband Level (Shoulder Internal Rotation)  Level 3 (Green)    Flexion  Right;20 reps    Theraband Level (Shoulder Flexion)  Level 3 (Green)    Extension  Right;20 reps    Theraband Level (Shoulder Extension)  Level 3 (Green)    Row  20 reps;Right    Theraband Level (Shoulder Row)  Level 3 (Green)    Other Standing Exercises  reaching to cabinet to first shelf  1-2#  weight x  15 reps, then bicep curls 3 pounds x 15      Shoulder Exercises: Pulleys   Flexion  2 minutes      Shoulder Exercises: ROM/Strengthening   UBE (Upper Arm Bike)  3 min for 3 min back L1.5      Manual Therapy   Joint Mobilization  Grade II and III AP and inferior glides to improve ER; axilla     Passive ROM  Right shoulder flexion, abduction, ER emphasis, GH mobs gentle grade 1-2  inferior, distraction, A-P  PT Short Term Goals - 02/27/19 1153      PT SHORT TERM GOAL #1   Title  Patient will demonstrate passive shoulder flexion to 60 degrees     Baseline  96    Time  4    Period  Weeks    Status  Achieved    Target Date  02/20/19      PT SHORT TERM GOAL #2   Title  Patient will begin AAROM to the right shoulder ( per MD guidlines)    Baseline  will go to the MD next week     Time  4    Period  Weeks    Status  Achieved    Target Date  02/20/19      PT SHORT TERM GOAL #3   Title  Patient will demonstrate 30 degrees of passive ROM     Baseline  17 degrees     Time  4    Period  Weeks    Status  Achieved        PT Long Term Goals - 04/09/19 1032      PT LONG TERM GOAL #1   Title  Patient will reach behind her head with right hand to brush her hair     Time  12    Period  Weeks    Status  New      PT LONG TERM GOAL #2   Title  Patient will use her right arm to feed herself     Baseline  limited more  by hand pain.     Status  On-going      PT LONG TERM GOAL #3   Title  Patient will tuck her shirt in behind her without increased pain with her right arm     Status  On-going      PT LONG TERM GOAL #4   Title  She will be able to lift 3 pounds to head height with no pain to access upper cabinets    Time  6    Period  Weeks    Status  New      PT LONG TERM GOAL #5   Title  She will report able to dress with min modification and perform selfcare normally below shoulder height    Time  6    Period  Weeks    Status  New      Additional Long Term Goals   Additional Long Term Goals  Yes      PT LONG TERM GOAL #6   Title  She will be able to carry 10 pounds with RT arm and place on counter top with no pain    Time  6    Period  Weeks    Status  New            Plan - 04/09/19 4128    Clinical Impression Statement  Per pt it appears MD OK with progress and she is now cleared for as able activity.  ROM may be ultimately be limited though she is progressing . Was more agressive today so she will let me know how sore she is post and need to ease back.     PT Treatment/Interventions  ADLs/Self Care Home Management;Electrical Stimulation;Cryotherapy;Canalith Repostioning;Moist Heat;Ultrasound;DME Instruction;Gait training;Functional mobility training;Stair training;Therapeutic activities;Therapeutic exercise;Neuromuscular re-education;Patient/family education;Manual techniques;Passive range of motion;Taping    PT Next Visit Plan  contniue strength and ROm to tolerance    PT Home Exercise Plan  pendulum;  towel squeeze; elbow pronation/supination, self PROM supine, tableslide PROM (sliding body away opposed to sliding arm fwd) added putty exercises and wrist flex/ext, pro/sup    Consulted and Agree with Plan of Care  Patient       Patient will benefit from skilled therapeutic intervention in order to improve the following deficits and impairments:  Pain, Decreased activity tolerance,  Decreased endurance, Decreased strength, Decreased range of motion, Impaired UE functional use, Decreased knowledge of precautions, Decreased safety awareness  Visit Diagnosis: Muscle weakness (generalized)  Stiffness of right shoulder, not elsewhere classified     Problem List Patient Active Problem List   Diagnosis Date Noted  . Post-op pain 12/27/2016  . OSA (obstructive sleep apnea) 08/25/2014    Darrel Hoover  PT 04/09/2019, 10:35 AM  [facility] [address] [place], [place], [address] Phone: [phone]   Fax:  [phone]  Name: Kimberly Haynes MRN: [id number] Date of Birth: [birth year]

## 2019-04-11 ENCOUNTER — Other Ambulatory Visit: Payer: Self-pay

## 2019-04-11 ENCOUNTER — Ambulatory Visit: Payer: 59

## 2019-04-11 DIAGNOSIS — M25611 Stiffness of right shoulder, not elsewhere classified: Secondary | ICD-10-CM

## 2019-04-11 DIAGNOSIS — M25511 Pain in right shoulder: Secondary | ICD-10-CM | POA: Diagnosis not present

## 2019-04-11 DIAGNOSIS — M25641 Stiffness of right hand, not elsewhere classified: Secondary | ICD-10-CM | POA: Diagnosis not present

## 2019-04-11 DIAGNOSIS — M79641 Pain in right hand: Secondary | ICD-10-CM | POA: Diagnosis not present

## 2019-04-11 DIAGNOSIS — R208 Other disturbances of skin sensation: Secondary | ICD-10-CM | POA: Diagnosis not present

## 2019-04-11 DIAGNOSIS — M25631 Stiffness of right wrist, not elsewhere classified: Secondary | ICD-10-CM | POA: Diagnosis not present

## 2019-04-11 DIAGNOSIS — M6281 Muscle weakness (generalized): Secondary | ICD-10-CM | POA: Diagnosis not present

## 2019-04-11 DIAGNOSIS — M25531 Pain in right wrist: Secondary | ICD-10-CM | POA: Diagnosis not present

## 2019-04-11 NOTE — Therapy (Signed)
[place], [place], [address] Phone: [phone]   Fax:  [phone]  Physical Therapy Treatment  Patient Details  Name: Kimberly Haynes MRN: [id number] Date of Birth: Apr 16, 1961 Referring Provider (PT): Dr Ophelia Charter    Encounter Date: 04/11/2019  PT End of Session - 04/11/19 0912    Visit Number  26    Number of Visits  36    Date for PT Re-Evaluation  05/30/19    Authorization Type  cone UMR, no VL    PT Start Time  0910    PT Stop Time  1000    PT Time Calculation (min)  50 min    Activity Tolerance  Patient tolerated treatment well    Behavior During Therapy  [facility] for tasks assessed/performed       Past Medical History:  Diagnosis Date  . Bipolar disorder ([place])   . Depression   . Hearing impairment   . Sleep apnea    uses dental appliance    Past Surgical History:  Procedure Laterality Date  . APPENDECTOMY    . 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 ORIF HUMERUS FRACTURE Right 01/12/2019   Procedure: OPEN REDUCTION INTERNAL FIXATION (ORIF) PROXIMAL HUMERUS FRACTURE;  Surgeon: Hiram Gash, MD;  Location: [place];  Service: Orthopedics;  Laterality: Right;  . RADIOACTIVE SEED GUIDED EXCISIONAL BREAST BIOPSY Right 11/07/2016   Procedure: RADIOACTIVE SEED GUIDED EXCISIONAL BREAST BIOPSY;  Surgeon: Rolm Bookbinder, MD;  Location: [place];  Service: General;  Laterality: Right;  . TUBAL LIGATION      There were no vitals filed for this visit.  Subjective Assessment - 04/11/19 0912    Subjective  No complaints to start    Currently in Pain?  No/denies         [facility] PT Assessment - 04/11/19 0001      PROM   Right Shoulder Flexion  120 Degrees   supine   Right Shoulder Internal Rotation  45 Degrees    Right Shoulder External Rotation  45  Degrees                   [facility] Adult PT Treatment/Exercise - 04/11/19 0001      Shoulder Exercises: Standing   External Rotation  Right;20 reps    Theraband Level (Shoulder External Rotation)  Level 3 (Green)    Internal Rotation  Right;20 reps    Theraband Level (Shoulder Internal Rotation)  Level 3 (Green)    Flexion  Right;20 reps    Theraband Level (Shoulder Flexion)  Level 3 (Green)    Flexion Limitations  rockwood punch    Extension  Right;20 reps    Theraband Level (Shoulder Extension)  Level 3 (Green)    Row  20 reps;Right    Theraband Level (Shoulder Row)  Level 3 (Green)    Other Standing Exercises  AAROM Flex/scaption/behind bak/extension   x 20      Shoulder Exercises: Pulleys   Flexion  2 minutes      Shoulder Exercises: ROM/Strengthening   UBE (Upper Arm Bike)  3 min for 3 min back L1.5      Moist Heat Therapy   Number Minutes Moist Heat  10 Minutes    Moist Heat Location  Shoulder      Manual Therapy   Joint  Mobilization  Grade 4 and III AP and inferior glides to improve ER; axilla     Passive ROM  Right shoulder flexion, abduction, ER emphasis, GH mobs gentle grade 1-2  inferior, distraction, A-P             PT Education - 04/11/19 1000    Education Details  ROM progress    Person(s) Educated  Patient    Methods  Explanation    Comprehension  Verbalized understanding       PT Short Term Goals - 02/27/19 1153      PT SHORT TERM GOAL #1   Title  Patient will demonstrate passive shoulder flexion to 60 degrees     Baseline  96    Time  4    Period  Weeks    Status  Achieved    Target Date  02/20/19      PT SHORT TERM GOAL #2   Title  Patient will begin AAROM to the right shoulder ( per MD guidlines)    Baseline  will go to the MD next week     Time  4    Period  Weeks    Status  Achieved    Target Date  02/20/19      PT SHORT TERM GOAL #3   Title  Patient will demonstrate 30 degrees of passive ROM     Baseline  17 degrees      Time  4    Period  Weeks    Status  Achieved        PT Long Term Goals - 04/11/19 1001      PT LONG TERM GOAL #1   Title  Patient will reach behind her head with right hand to brush her hair     Baseline  still need sto moidify due to RT hand pain.     Status  On-going      PT LONG TERM GOAL #2   Title  Patient will use her right arm to feed herself     Baseline  limited more by hand pain.     Status  On-going      PT LONG TERM GOAL #3   Title  Patient will tuck her shirt in behind her without increased pain with her right arm     Status  On-going            Plan - 04/11/19 0919    Clinical Impression Statement  Progressing with ROM. Pain controlled.    PT Treatment/Interventions  ADLs/Self Care Home Management;Electrical Stimulation;Cryotherapy;Canalith Repostioning;Moist Heat;Ultrasound;DME Instruction;Gait training;Functional mobility training;Stair training;Therapeutic activities;Therapeutic exercise;Neuromuscular re-education;Patient/family education;Manual techniques;Passive range of motion;Taping    PT Next Visit Plan  contniue strength and ROm to tolerance    PT Home Exercise Plan  pendulum; towel squeeze; elbow pronation/supination, self PROM supine, tableslide PROM (sliding body away opposed to sliding arm fwd) added putty exercises and wrist flex/ext, pro/sup    Consulted and Agree with Plan of Care  Patient       Patient will benefit from skilled therapeutic intervention in order to improve the following deficits and impairments:  Pain, Decreased activity tolerance, Decreased endurance, Decreased strength, Decreased range of motion, Impaired UE functional use, Decreased knowledge of precautions, Decreased safety awareness  Visit Diagnosis: Muscle weakness (generalized)  Stiffness of right shoulder, not elsewhere classified     Problem List Patient Active Problem List   Diagnosis Date Noted  . Post-op pain 12/27/2016  . OSA (obstructive sleep  apnea)  08/25/2014    Darrel Hoover  PT 04/11/2019, 10:03 AM  [facility] [address] [place], [place], [address] Phone: [phone]   Fax:  [phone]  Name: Kimberly Haynes MRN: [id number] Date of Birth: 11-02-1960

## 2019-04-16 ENCOUNTER — Ambulatory Visit: Payer: 59

## 2019-04-16 ENCOUNTER — Other Ambulatory Visit: Payer: Self-pay

## 2019-04-16 DIAGNOSIS — M6281 Muscle weakness (generalized): Secondary | ICD-10-CM

## 2019-04-16 DIAGNOSIS — M25631 Stiffness of right wrist, not elsewhere classified: Secondary | ICD-10-CM | POA: Diagnosis not present

## 2019-04-16 DIAGNOSIS — M25611 Stiffness of right shoulder, not elsewhere classified: Secondary | ICD-10-CM

## 2019-04-16 DIAGNOSIS — M79641 Pain in right hand: Secondary | ICD-10-CM | POA: Diagnosis not present

## 2019-04-16 DIAGNOSIS — M25531 Pain in right wrist: Secondary | ICD-10-CM | POA: Diagnosis not present

## 2019-04-16 DIAGNOSIS — R208 Other disturbances of skin sensation: Secondary | ICD-10-CM | POA: Diagnosis not present

## 2019-04-16 DIAGNOSIS — M25511 Pain in right shoulder: Secondary | ICD-10-CM | POA: Diagnosis not present

## 2019-04-16 DIAGNOSIS — M25641 Stiffness of right hand, not elsewhere classified: Secondary | ICD-10-CM | POA: Diagnosis not present

## 2019-04-16 NOTE — Therapy (Signed)
[place], [place], [address] Phone: [phone]   Fax:  [phone]  Physical Therapy Treatment  Patient Details  Name: Kimberly Haynes MRN: [id number] Date of Birth: [birth year] Referring Provider (PT): Dr Ophelia Charter    Encounter Date: 04/16/2019  PT End of Session - 04/16/19 0954    Visit Number  27    Number of Visits  36    Date for PT Re-Evaluation  05/30/19    PT Start Time  0950    PT Stop Time  1040    PT Time Calculation (min)  50 min    Activity Tolerance  Patient tolerated treatment well    Behavior During Therapy  [facility] for tasks assessed/performed       Past Medical History:  Diagnosis Date  . Bipolar disorder ([place])   . Depression   . Hearing impairment   . Sleep apnea    uses dental appliance    Past Surgical History:  Procedure Laterality Date  . APPENDECTOMY    . 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 ORIF HUMERUS FRACTURE Right 01/12/2019   Procedure: OPEN REDUCTION INTERNAL FIXATION (ORIF) PROXIMAL HUMERUS FRACTURE;  Surgeon: Hiram Gash, MD;  Location: [place];  Service: Orthopedics;  Laterality: Right;  . RADIOACTIVE SEED GUIDED EXCISIONAL BREAST BIOPSY Right 11/07/2016   Procedure: RADIOACTIVE SEED GUIDED EXCISIONAL BREAST BIOPSY;  Surgeon: Rolm Bookbinder, MD;  Location: [place];  Service: General;  Laterality: Right;  . TUBAL LIGATION      There were no vitals filed for this visit.  Subjective Assessment - 04/16/19 0953    Subjective  No new complaints    Currently in Pain?  No/denies   at rest                      [facility] Adult PT Treatment/Exercise - 04/16/19 0001      Shoulder Exercises: Supine   Protraction  Strengthening;Right;20 reps    Protraction Weight (lbs)  7    Horizontal ABduction   Right;15 reps    Horizontal ABduction Weight (lbs)  2      Shoulder Exercises: Standing   External Rotation  Right;20 reps    Theraband Level (Shoulder External Rotation)  Level 3 (Green)    Internal Rotation  Right;20 reps    Theraband Level (Shoulder Internal Rotation)  Level 3 (Green)    Flexion  Right;20 reps    Theraband Level (Shoulder Flexion)  Level 3 (Green)    Flexion Limitations  rockwood punch    Extension  Right;20 reps    Theraband Level (Shoulder Extension)  Level 3 (Green)    Row  20 reps;Right    Theraband Level (Shoulder Row)  Level 3 (Green)    Other Standing Exercises  wall ladder x 5      Shoulder Exercises: ROM/Strengthening   UBE (Upper Arm Bike)  3 min for 3 min back L 2      Moist Heat Therapy   Number Minutes Moist Heat  10 Minutes    Moist Heat Location  Shoulder      Manual Therapy   Joint Mobilization  Grade 4 and III AP and inferior glides to improve ER; axilla                PT Short  Term Goals - 02/27/19 1153      PT SHORT TERM GOAL #1   Title  Patient will demonstrate passive shoulder flexion to 60 degrees     Baseline  96    Time  4    Period  Weeks    Status  Achieved    Target Date  02/20/19      PT SHORT TERM GOAL #2   Title  Patient will begin AAROM to the right shoulder ( per MD guidlines)    Baseline  will go to the MD next week     Time  4    Period  Weeks    Status  Achieved    Target Date  02/20/19      PT SHORT TERM GOAL #3   Title  Patient will demonstrate 30 degrees of passive ROM     Baseline  17 degrees     Time  4    Period  Weeks    Status  Achieved        PT Long Term Goals - 04/11/19 1001      PT LONG TERM GOAL #1   Title  Patient will reach behind her head with right hand to brush her hair     Baseline  still need sto moidify due to RT hand pain.     Status  On-going      PT LONG TERM GOAL #2   Title  Patient will use her right arm to feed herself     Baseline  limited more by hand pain.      Status  On-going      PT LONG TERM GOAL #3   Title  Patient will tuck her shirt in behind her without increased pain with her right arm     Status  On-going            Plan - 04/16/19 0955    PT Treatment/Interventions  ADLs/Self Care Home Management;Electrical Stimulation;Cryotherapy;Canalith Repostioning;Moist Heat;Ultrasound;DME Instruction;Gait training;Functional mobility training;Stair training;Therapeutic activities;Therapeutic exercise;Neuromuscular re-education;Patient/family education;Manual techniques;Passive range of motion;Taping    PT Next Visit Plan  contniue strength and ROm to tolerance    PT Home Exercise Plan  pendulum; towel squeeze; elbow pronation/supination, self PROM supine, tableslide PROM (sliding body away opposed to sliding arm fwd) added putty exercises and wrist flex/ext, pro/sup    Consulted and Agree with Plan of Care  Patient       Patient will benefit from skilled therapeutic intervention in order to improve the following deficits and impairments:  Pain, Decreased activity tolerance, Decreased endurance, Decreased strength, Decreased range of motion, Impaired UE functional use, Decreased knowledge of precautions, Decreased safety awareness  Visit Diagnosis: 1. Muscle weakness (generalized)   2. Stiffness of right shoulder, not elsewhere classified        Problem List Patient Active Problem List   Diagnosis Date Noted  . Post-op pain 12/27/2016  . OSA (obstructive sleep apnea) 08/25/2014    Darrel Hoover  PT 04/16/2019, 10:36 AM  [facility] [address] [place], [place], [address] Phone: [phone]   Fax:  [phone]  Name: Kimberly Haynes MRN: [id number] Date of Birth: [birth year]

## 2019-04-18 ENCOUNTER — Ambulatory Visit: Payer: 59

## 2019-04-18 ENCOUNTER — Other Ambulatory Visit: Payer: Self-pay

## 2019-04-18 DIAGNOSIS — M6281 Muscle weakness (generalized): Secondary | ICD-10-CM | POA: Diagnosis not present

## 2019-04-18 DIAGNOSIS — M25531 Pain in right wrist: Secondary | ICD-10-CM | POA: Diagnosis not present

## 2019-04-18 DIAGNOSIS — M25611 Stiffness of right shoulder, not elsewhere classified: Secondary | ICD-10-CM

## 2019-04-18 DIAGNOSIS — M25641 Stiffness of right hand, not elsewhere classified: Secondary | ICD-10-CM | POA: Diagnosis not present

## 2019-04-18 DIAGNOSIS — G4733 Obstructive sleep apnea (adult) (pediatric): Secondary | ICD-10-CM | POA: Diagnosis not present

## 2019-04-18 DIAGNOSIS — M25511 Pain in right shoulder: Secondary | ICD-10-CM | POA: Diagnosis not present

## 2019-04-18 DIAGNOSIS — R208 Other disturbances of skin sensation: Secondary | ICD-10-CM | POA: Diagnosis not present

## 2019-04-18 DIAGNOSIS — M25631 Stiffness of right wrist, not elsewhere classified: Secondary | ICD-10-CM | POA: Diagnosis not present

## 2019-04-18 DIAGNOSIS — M79641 Pain in right hand: Secondary | ICD-10-CM | POA: Diagnosis not present

## 2019-04-18 NOTE — Therapy (Signed)
[place], [place], [address] Phone: [phone]   Fax:  [phone]  Physical Therapy Treatment  Patient Details  Name: Kimberly Haynes MRN: [id number] Date of Birth: April 02, 1961 Referring Provider (PT): Dr Ophelia Charter    Encounter Date: 04/18/2019  PT End of Session - 04/18/19 0917    Visit Number  28    Number of Visits  36    Date for PT Re-Evaluation  05/30/19    Authorization Type  cone UMR, no VL    PT Start Time  0917    PT Stop Time  1012    PT Time Calculation (min)  55 min    Activity Tolerance  Patient tolerated treatment well    Behavior During Therapy  [facility] for tasks assessed/performed       Past Medical History:  Diagnosis Date  . Bipolar disorder ([place])   . Depression   . Hearing impairment   . Sleep apnea    uses dental appliance    Past Surgical History:  Procedure Laterality Date  . APPENDECTOMY    . 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 ORIF HUMERUS FRACTURE Right 01/12/2019   Procedure: OPEN REDUCTION INTERNAL FIXATION (ORIF) PROXIMAL HUMERUS FRACTURE;  Surgeon: Hiram Gash, MD;  Location: [place];  Service: Orthopedics;  Laterality: Right;  . RADIOACTIVE SEED GUIDED EXCISIONAL BREAST BIOPSY Right 11/07/2016   Procedure: RADIOACTIVE SEED GUIDED EXCISIONAL BREAST BIOPSY;  Surgeon: Rolm Bookbinder, MD;  Location: [place];  Service: General;  Laterality: Right;  . TUBAL LIGATION      There were no vitals filed for this visit.  Subjective Assessment - 04/18/19 0920    Subjective  Doing fine . no complaints    Currently in Pain?  No/denies                       [facility] Adult PT Treatment/Exercise - 04/18/19 0001      Shoulder Exercises: Supine   External Rotation  Right;15 reps   3 sets   Diagonals  Right;15  reps    Diagonals Limitations  3 sets LT hip to overhead with gentle end range stretch  then 3 sets of ER  x15  , then 3 sets IR       Shoulder Exercises: Prone   Extension  Right;15 reps    Extension Weight (lbs)  2    Horizontal ABduction 1  15 reps;Right    Horizontal ABduction 1 Limitations  (0 degrees abduction and highrer than 90 degees 2 sets of 15 AA each    Other Prone Exercises  row 15 reps 2 #      Shoulder Exercises: Standing   Other Standing Exercises  wall ladder x 5    Other Standing Exercises  red band x 20 forward and backward mimic tennis swing fore and back hand      Shoulder Exercises: Pulleys   Flexion  3 minutes      Shoulder Exercises: ROM/Strengthening   UBE (Upper Arm Bike)  3 min for 3 min back L 2    [place]  flexion and scaption and circles X 15 ea  with 3 pounds on wrist.      Moist Heat Therapy   Number Minutes Moist Heat  10 Minutes    Moist Heat  Location  Shoulder      Manual Therapy   Joint Mobilization  Grade 4 and III AP and inferior glides to improve ER/flexion;      Passive ROM  Right shoulder flexion, abduction, ER emphasis, GH mobs gentle grade 1-2  inferior, distraction, A-P               PT Short Term Goals - 02/27/19 1153      PT SHORT TERM GOAL #1   Title  Patient will demonstrate passive shoulder flexion to 60 degrees     Baseline  96    Time  4    Period  Weeks    Status  Achieved    Target Date  02/20/19      PT SHORT TERM GOAL #2   Title  Patient will begin AAROM to the right shoulder ( per MD guidlines)    Baseline  will go to the MD next week     Time  4    Period  Weeks    Status  Achieved    Target Date  02/20/19      PT SHORT TERM GOAL #3   Title  Patient will demonstrate 30 degrees of passive ROM     Baseline  17 degrees     Time  4    Period  Weeks    Status  Achieved        PT Long Term Goals - 04/18/19 1009      PT LONG TERM GOAL #1   Title  Patient will reach behind her head with right hand  to brush her hair     Baseline  still need sto moidify due to RT hand pain.     Status  Partially Met      PT LONG TERM GOAL #2   Title  Patient will use her right arm to feed herself     Baseline  limited more by hand pain.     Status  On-going      PT LONG TERM GOAL #3   Title  Patient will tuck her shirt in behind her without increased pain with her right arm     Baseline  able to get to sacrum  but incr pain    Status  On-going      PT LONG TERM GOAL #4   Title  She will be able to lift 3 pounds to head height with no pain to access upper cabinets    Status  On-going      PT LONG TERM GOAL #5   Title  She will report able to dress with min modification and perform selfcare normally below shoulder height    Status  On-going      PT LONG TERM GOAL #6   Title  She will be able to carry 10 pounds with RT arm and place on counter top with no pain    Status  On-going            Plan - 04/18/19 1324    Clinical Impression Statement  tolerating different positions for ROM and strength. Add weight as able.    PT Treatment/Interventions  ADLs/Self Care Home Management;Electrical Stimulation;Cryotherapy;Canalith Repostioning;Moist Heat;Ultrasound;DME Instruction;Gait training;Functional mobility training;Stair training;Therapeutic activities;Therapeutic exercise;Neuromuscular re-education;Patient/family education;Manual techniques;Passive range of motion;Taping    PT Next Visit Plan  contniue strength and ROm to tolerance    PT Home Exercise Plan  pendulum; towel squeeze; elbow pronation/supination, self PROM supine, tableslide PROM (sliding body  away opposed to sliding arm fwd) added putty exercises and wrist flex/ext, pro/sup    Consulted and Agree with Plan of Care  Patient       Patient will benefit from skilled therapeutic intervention in order to improve the following deficits and impairments:  Pain, Decreased activity tolerance, Decreased endurance, Decreased strength,  Decreased range of motion, Impaired UE functional use, Decreased knowledge of precautions, Decreased safety awareness  Visit Diagnosis: 1. Muscle weakness (generalized)   2. Stiffness of right shoulder, not elsewhere classified        Problem List Patient Active Problem List   Diagnosis Date Noted  . Post-op pain 12/27/2016  . OSA (obstructive sleep apnea) 08/25/2014    Darrel Hoover  PT 04/18/2019, 10:11 AM  [facility] [address] [place], [place], [address] Phone: [phone]   Fax:  [phone]  Name: Kimberly Haynes MRN: [id number] Date of Birth: [birth year]

## 2019-04-21 DIAGNOSIS — Z20828 Contact with and (suspected) exposure to other viral communicable diseases: Secondary | ICD-10-CM | POA: Diagnosis not present

## 2019-04-23 ENCOUNTER — Ambulatory Visit: Payer: 59

## 2019-04-23 DIAGNOSIS — Z20828 Contact with and (suspected) exposure to other viral communicable diseases: Secondary | ICD-10-CM | POA: Diagnosis not present

## 2019-04-25 ENCOUNTER — Other Ambulatory Visit: Payer: Self-pay

## 2019-04-25 ENCOUNTER — Ambulatory Visit: Payer: 59

## 2019-04-25 DIAGNOSIS — M6281 Muscle weakness (generalized): Secondary | ICD-10-CM | POA: Diagnosis not present

## 2019-04-25 DIAGNOSIS — M25641 Stiffness of right hand, not elsewhere classified: Secondary | ICD-10-CM | POA: Diagnosis not present

## 2019-04-25 DIAGNOSIS — M25611 Stiffness of right shoulder, not elsewhere classified: Secondary | ICD-10-CM

## 2019-04-25 DIAGNOSIS — M25511 Pain in right shoulder: Secondary | ICD-10-CM | POA: Diagnosis not present

## 2019-04-25 DIAGNOSIS — R208 Other disturbances of skin sensation: Secondary | ICD-10-CM | POA: Diagnosis not present

## 2019-04-25 DIAGNOSIS — M25531 Pain in right wrist: Secondary | ICD-10-CM | POA: Diagnosis not present

## 2019-04-25 DIAGNOSIS — M25631 Stiffness of right wrist, not elsewhere classified: Secondary | ICD-10-CM | POA: Diagnosis not present

## 2019-04-25 DIAGNOSIS — M79641 Pain in right hand: Secondary | ICD-10-CM | POA: Diagnosis not present

## 2019-04-25 NOTE — Therapy (Signed)
[place], [place], [address] Phone: [phone]   Fax:  [phone]  Physical Therapy Treatment  Patient Details  Name: Kimberly Haynes MRN: [id number] Date of Birth: 01/08/1961 Referring Provider (PT): Dr Ophelia Charter    Encounter Date: 04/25/2019  PT End of Session - 04/25/19 0934    Visit Number  29    Number of Visits  36    Date for PT Re-Evaluation  05/30/19    Authorization Type  cone UMR, no VL    PT Start Time  0915    PT Stop Time  1008    PT Time Calculation (min)  53 min    Activity Tolerance  Patient tolerated treatment well    Behavior During Therapy  [facility] for tasks assessed/performed       Past Medical History:  Diagnosis Date  . Bipolar disorder ([place])   . Depression   . Hearing impairment   . Sleep apnea    uses dental appliance    Past Surgical History:  Procedure Laterality Date  . APPENDECTOMY    . COCHLEAR IMPLANT Left 12/27/2016   Procedure: COCHLEAR IMPLANT LEFT EAR;  Surgeon: Vicie Mutters, MD;  Location: [place];  Service: ENT;  Laterality: Left;  . ORIF HUMERUS FRACTURE Right 01/12/2019   Procedure: OPEN REDUCTION INTERNAL FIXATION (ORIF) PROXIMAL HUMERUS FRACTURE;  Surgeon: Hiram Gash, MD;  Location: [place];  Service: Orthopedics;  Laterality: Right;  . RADIOACTIVE SEED GUIDED EXCISIONAL BREAST BIOPSY Right 11/07/2016   Procedure: RADIOACTIVE SEED GUIDED EXCISIONAL BREAST BIOPSY;  Surgeon: Rolm Bookbinder, MD;  Location: [place];  Service: General;  Laterality: Right;  . TUBAL LIGATION      There were no vitals filed for this visit.  Subjective Assessment - 04/25/19 0917    Subjective  No problems. Daughter had to have deliver her bay [place].    Currently in Pain?  No/denies                       [facility] Adult PT Treatment/Exercise - 04/25/19 0001      Shoulder Exercises: Standing   External Rotation  Right;20 reps     Theraband Level (Shoulder External Rotation)  Level 3 (Green)    Internal Rotation  Right;20 reps    Theraband Level (Shoulder Internal Rotation)  Level 3 (Green)    Flexion  Right;20 reps    Theraband Level (Shoulder Flexion)  Level 3 (Green)    Extension  Right;20 reps    Theraband Level (Shoulder Extension)  Level 3 (Green)    Row  20 reps;Right    Theraband Level (Shoulder Row)  Level 3 (Green)    Other Standing Exercises  wall ladder x 5    Other Standing Exercises  green band x 20 forward and backward mimic tennis swing fore and back hand      Shoulder Exercises: Pulleys   Flexion  3 minutes      Shoulder Exercises: ROM/Strengthening   UBE (Upper Arm Bike)  3 min for 3 min back L 2.5    [place]  flexion and scaption and circles X 15 ea  with 5 pounds on wrist.      Manual Therapy   Joint Mobilization  Grade 4 and III AP and inferior glides to improve ER/flexion;      Passive ROM  Right shoulder flexion, abduction, ER emphasis, GH mobs gentle grade 1-2  inferior, distraction,  A-P               PT Short Term Goals - 02/27/19 1153      PT SHORT TERM GOAL #1   Title  Patient will demonstrate passive shoulder flexion to 60 degrees     Baseline  96    Time  4    Period  Weeks    Status  Achieved    Target Date  02/20/19      PT SHORT TERM GOAL #2   Title  Patient will begin AAROM to the right shoulder ( per MD guidlines)    Baseline  will go to the MD next week     Time  4    Period  Weeks    Status  Achieved    Target Date  02/20/19      PT SHORT TERM GOAL #3   Title  Patient will demonstrate 30 degrees of passive ROM     Baseline  17 degrees     Time  4    Period  Weeks    Status  Achieved        PT Long Term Goals - 04/18/19 1009      PT LONG TERM GOAL #1   Title  Patient will reach behind her head with right hand to brush her hair     Baseline  still need sto moidify due to RT hand pain.     Status  Partially Met      PT LONG TERM GOAL #2    Title  Patient will use her right arm to feed herself     Baseline  limited more by hand pain.     Status  On-going      PT LONG TERM GOAL #3   Title  Patient will tuck her shirt in behind her without increased pain with her right arm     Baseline  able to get to sacrum  but incr pain    Status  On-going      PT LONG TERM GOAL #4   Title  She will be able to lift 3 pounds to head height with no pain to access upper cabinets    Status  On-going      PT LONG TERM GOAL #5   Title  She will report able to dress with min modification and perform selfcare normally below shoulder height    Status  On-going      PT LONG TERM GOAL #6   Title  She will be able to carry 10 pounds with RT arm and place on counter top with no pain    Status  On-going            Plan - 04/25/19 0919    Clinical Impression Statement  Still limited with ROM but strength appears to be improving.    She is pleased with progress but  .not satisfied with limits    Personal Factors and Comorbidities  Profession    Examination-Activity Limitations  Bathing;Hygiene/Grooming;Reach Overhead;Carry;Dressing;Lift;Caring for Others    PT Treatment/Interventions  ADLs/Self Care Home Management;Electrical Stimulation;Cryotherapy;Canalith Repostioning;Moist Heat;Ultrasound;DME Instruction;Gait training;Functional mobility training;Stair training;Therapeutic activities;Therapeutic exercise;Neuromuscular re-education;Patient/family education;Manual techniques;Passive range of motion;Taping    PT Next Visit Plan  contniue strength and ROm to tolerance    PT Home Exercise Plan  pendulum; towel squeeze; elbow pronation/supination, self PROM supine, tableslide PROM (sliding body away opposed to sliding arm fwd) added putty exercises and wrist flex/ext, pro/sup  Consulted and Agree with Plan of Care  Patient       Patient will benefit from skilled therapeutic intervention in order to improve the following deficits and impairments:   Pain, Decreased activity tolerance, Decreased endurance, Decreased strength, Decreased range of motion, Impaired UE functional use, Decreased knowledge of precautions, Decreased safety awareness  Visit Diagnosis: 1. Muscle weakness (generalized)   2. Stiffness of right shoulder, not elsewhere classified        Problem List Patient Active Problem List   Diagnosis Date Noted  . Post-op pain 12/27/2016  . OSA (obstructive sleep apnea) 08/25/2014    Darrel Hoover  PT 04/25/2019, 9:37 AM  [facility] [address] [place], [place], [address] Phone: [phone]   Fax:  [phone]  Name: Pailyn [place] MRN: [id number] Date of Birth: [birth year]

## 2019-04-28 ENCOUNTER — Ambulatory Visit: Payer: 59 | Admitting: Occupational Therapy

## 2019-04-28 ENCOUNTER — Other Ambulatory Visit: Payer: Self-pay

## 2019-04-28 ENCOUNTER — Ambulatory Visit: Payer: 59

## 2019-04-28 ENCOUNTER — Encounter: Payer: Self-pay | Admitting: Occupational Therapy

## 2019-04-28 DIAGNOSIS — R208 Other disturbances of skin sensation: Secondary | ICD-10-CM

## 2019-04-28 DIAGNOSIS — M6281 Muscle weakness (generalized): Secondary | ICD-10-CM

## 2019-04-28 DIAGNOSIS — M25611 Stiffness of right shoulder, not elsewhere classified: Secondary | ICD-10-CM

## 2019-04-28 DIAGNOSIS — M25631 Stiffness of right wrist, not elsewhere classified: Secondary | ICD-10-CM

## 2019-04-28 DIAGNOSIS — M25641 Stiffness of right hand, not elsewhere classified: Secondary | ICD-10-CM

## 2019-04-28 DIAGNOSIS — M79641 Pain in right hand: Secondary | ICD-10-CM | POA: Diagnosis not present

## 2019-04-28 DIAGNOSIS — M25531 Pain in right wrist: Secondary | ICD-10-CM | POA: Diagnosis not present

## 2019-04-28 DIAGNOSIS — M25511 Pain in right shoulder: Secondary | ICD-10-CM | POA: Diagnosis not present

## 2019-04-28 NOTE — Therapy (Signed)
[facility] [address] [place], [place], [address] Phone: [phone]   Fax:  [phone]  Occupational Therapy Treatment  Patient Details  Name: Kimberly Haynes MRN: [id number] Date of Birth: 21-Aug-1961 Referring Provider (OT): Dr. Ophelia Charter   Encounter Date: 04/28/2019  OT End of Session - 04/28/19 0907    Visit Number  2    Number of Visits  13    Date for OT Re-Evaluation  05/15/19    Authorization Type  [employer] [place] [employer], no visit limit/authorization required    OT Start Time  0705    OT Stop Time  0750    OT Time Calculation (min)  45 min    Activity Tolerance  Patient tolerated treatment well    Behavior During Therapy  [facility] for tasks assessed/performed       Past Medical History:  Diagnosis Date  . Bipolar disorder ([place])   . Depression   . Hearing impairment   . Sleep apnea    uses dental appliance    Past Surgical History:  Procedure Laterality Date  . APPENDECTOMY    . 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;  Service: ENT;  Laterality: Left;  . ORIF HUMERUS FRACTURE Right 01/12/2019   Procedure: OPEN REDUCTION INTERNAL FIXATION (ORIF) PROXIMAL HUMERUS FRACTURE;  Surgeon: Hiram Gash, MD;  Location: [place] [facility];  Service: Orthopedics;  Laterality: Right;  . RADIOACTIVE SEED GUIDED EXCISIONAL BREAST BIOPSY Right 11/07/2016   Procedure: RADIOACTIVE SEED GUIDED EXCISIONAL BREAST BIOPSY;  Surgeon: Rolm Bookbinder, MD;  Location: [place];  Service: General;  Laterality: Right;  . TUBAL LIGATION      There were no vitals filed for this visit.  Subjective Assessment - 04/28/19 0714    Subjective   My hand is better.  It's not as swollen.  (Pt reports that she is leaving for the [place] soon for 2 weeks)    Pertinent History  sleep apnea, bipolar disorder,s/p R humeral fx and ORIF on 01/12/2019  (injured 01/11/19) see MD next 6/9 (arm in sling x6 weeks); hearing impaired    Limitations  s/p R humeral fx and ORIF on 01/12/2019 (injured 01/11/19) see MD next 6/9 (arm in sling x6 weeks); hearing impaired    Currently in Pain?  Yes    Pain Score  3     Pain Location  Hand    Pain Orientation  Right    Pain Type  Chronic pain    Pain Onset  More than a month ago    Pain Frequency  Constant    Aggravating Factors   use, mornings    Pain Relieving Factors  OTC pain meds         CLINIC OPERATION CHANGES: Outpatient Neuro Rehab is open at lower capacity following universal masking, social distancing, and patient screening.  The patient's COVID risk of complications score is 1.  Fludio x 10 min to right hand for pain, stiffness, desensitization with no adverse reactions.    AROM isolated IP flex/ext, isolated MP flex/ext, composite flex  AROM thumb opposition to each digit and base of 5th digit  Place and holds in composite finger flex.  Prayer stretch for composite wrist/finger ext.  Wrist flex/ext with 1lb weight x10 each   PROM supination (self) after instruction       OT Education - 04/28/19 1133    Education Details  Reviewed initial ROM HEP and pt returned demo with min v.c.   Added PROM supination to HEP.--see pt instructions.  Pt reports that she was given yellow putty by PT and has performed wrist flex/ext with 1lb weight--requested pt continue with this and bring yellow putty in next session for review and updates prn.    Person(s) Educated  Patient    Methods  Explanation;Demonstration;Verbal cues;Handout    Comprehension  Verbalized understanding;Returned demonstration;Verbal cues required       OT Short Term Goals - 03/31/19 1035      OT SHORT TERM GOAL #1   Title  Pt will be independent with HEP for R hand/wrist ROM.--check STGs 04/30/19    Time  4    Period  Weeks    Status  New      OT SHORT TERM GOAL #2   Title  Pt will demo at least 90% R finger flex  for improved grasp of objects.    Baseline  50%    Time  4    Period  Weeks    Status  New      OT SHORT TERM GOAL #3   Title  Pt will demo at least 60* R wrist extension for ADLs.    Baseline  40*    Period  Weeks    Status  New      OT SHORT TERM GOAL #4   Title  Pt will report R hand/wrist pain less than or equal to 4/10 for BADLs.    Time  4    Period  Weeks    Status  New      OT SHORT TERM GOAL #5   Title  Pt will use RUE as dominant UE for BADLs at least 50% of the time.    Time  4    Period  Weeks    Status  New        OT Long Term Goals - 03/31/19 1039      OT LONG TERM GOAL #1   Title  Pt will be independent with stengthening HEP for R hand/wrist.--check LTGs 05/30/19    Time  8    Period  Weeks    Status  New      OT LONG TERM GOAL #2   Title  Pt will demo at least 35lbs R grip strength for opening containers/picking up objects.    Baseline  13lbs    Time  8    Period  Weeks    Status  New      OT LONG TERM GOAL #3   Title  Pt will report pain in R hand/wrist less than or equal to 3/10 for light ADLs.    Time  8    Period  Weeks    Status  New      OT LONG TERM GOAL #4   Title  Pt will use RUE as dominant UE for ADLs at least 90% of the time.    Time  8    Period  Weeks    Status  New      OT LONG TERM GOAL #5   Title  Pt will demo at least 5lb improvement in lateral pinch strength and at least 4lb improvement in 3point pinch strength for ADLs.    Baseline  lateral 5.5lbs, 3point 4lbs    Time  8    Period  Weeks    Status  New  Plan - 04/28/19 0907    Clinical Impression Statement  Pt with improving ROM by end of session (approx 90% vs. approx 50% initially).  Pt is progressing toward goals.    OT Occupational Profile and History  Problem Focused Assessment - Including review of records relating to presenting problem    Occupational performance deficits (Please refer to evaluation for details):  ADL's;IADL's;Rest and  Sleep;Leisure;Social Participation    Body Structure / Function / Physical Skills  ADL;Coordination;UE functional use;IADL;Pain;FMC;Strength;ROM;Edema;Sensation    Rehab Potential  Good    Clinical Decision Making  Several treatment options, min-mod task modification necessary    Comorbidities Affecting Occupational Performance:  May have comorbidities impacting occupational performance    Modification or Assistance to Complete Evaluation   No modification of tasks or assist necessary to complete eval    OT Frequency  2x / week    OT Duration  8 weeks   +eval   OT Treatment/Interventions  Self-care/ADL training;Electrical Stimulation;Therapeutic exercise;Patient/family education;Splinting;Neuromuscular education;Paraffin;Moist Heat;Fluidtherapy;Energy conservation;Therapeutic activities;Passive range of motion;Manual Therapy;DME and/or AE instruction;Contrast Bath;Ultrasound;Cryotherapy    Plan  fludio, A/PROM, gentle strengthening (pt to bring in yellow putty she received from PT)--issue HEP prn    OT Home Exercise Plan  Education provided:  initial HEP 6/1    Recommended Other Services  seeing ortho PT for R shoulder    Consulted and Agree with Plan of Care  Patient       Patient will benefit from skilled therapeutic intervention in order to improve the following deficits and impairments:   Body Structure / Function / Physical Skills: ADL, Coordination, UE functional use, IADL, Pain, FMC, Strength, ROM, Edema, Sensation       Visit Diagnosis: 1. Stiffness of right shoulder, not elsewhere classified   2. Muscle weakness (generalized)   3. Pain in right hand   4. Stiffness of right hand, not elsewhere classified   5. Stiffness of right wrist, not elsewhere classified   6. Pain in right wrist   7. Other disturbances of skin sensation       Problem List Patient Active Problem List   Diagnosis Date Noted  . Post-op pain 12/27/2016  . OSA (obstructive sleep apnea) 08/25/2014     [facility] 04/28/2019, 11:36 AM  [employer] [address] [place], [place], [address] Phone: [phone]   Fax:  [phone]  Name: Kimberly Haynes MRN: [id number] Date of Birth: 03/27/1961   Vianne Bulls, OTR/L [facility] [address] [place], [place]  [address] [phone] phone [phone] 04/28/19 11:38 AM

## 2019-04-28 NOTE — Patient Instructions (Signed)
Supination (Passive)   Sit up tall. Keep elbow bent at right angle and held firmly at side. Use other hand to turn forearm until palm faces upward. Hold 10 seconds. Repeat 5 times. Do 3 sessions per day.  Copyright  VHI. All rights reserved.

## 2019-04-29 ENCOUNTER — Ambulatory Visit: Payer: 59

## 2019-04-30 ENCOUNTER — Ambulatory Visit: Payer: 59

## 2019-04-30 ENCOUNTER — Ambulatory Visit: Payer: 59 | Attending: Orthopaedic Surgery | Admitting: Occupational Therapy

## 2019-04-30 ENCOUNTER — Other Ambulatory Visit: Payer: Self-pay

## 2019-04-30 DIAGNOSIS — M25641 Stiffness of right hand, not elsewhere classified: Secondary | ICD-10-CM | POA: Diagnosis not present

## 2019-04-30 DIAGNOSIS — M25611 Stiffness of right shoulder, not elsewhere classified: Secondary | ICD-10-CM | POA: Insufficient documentation

## 2019-04-30 DIAGNOSIS — M79641 Pain in right hand: Secondary | ICD-10-CM | POA: Insufficient documentation

## 2019-04-30 DIAGNOSIS — R208 Other disturbances of skin sensation: Secondary | ICD-10-CM | POA: Diagnosis not present

## 2019-04-30 DIAGNOSIS — M25631 Stiffness of right wrist, not elsewhere classified: Secondary | ICD-10-CM | POA: Insufficient documentation

## 2019-04-30 DIAGNOSIS — M25531 Pain in right wrist: Secondary | ICD-10-CM | POA: Diagnosis not present

## 2019-04-30 DIAGNOSIS — M6281 Muscle weakness (generalized): Secondary | ICD-10-CM

## 2019-04-30 NOTE — Therapy (Signed)
[facility] [address] [place], [place], [address] Phone: [phone]   Fax:  [phone]  Occupational Therapy Treatment  Patient Details  Name: Kimberly Haynes MRN: [id number] Date of Birth: 1961-01-22 Referring Provider (OT): Dr. Ophelia Charter   Encounter Date: 04/30/2019  OT End of Session - 04/30/19 1817    Visit Number  3    Number of Visits  13    Date for OT Re-Evaluation  05/15/19    Authorization Type  [place] [employer], no visit limit/authorization required    OT Start Time  6767    OT Stop Time  1835    OT Time Calculation (min)  43 min    Activity Tolerance  Patient tolerated treatment well    Behavior During Therapy  [facility] for tasks assessed/performed       Past Medical History:  Diagnosis Date  . Bipolar disorder ([place])   . Depression   . Hearing impairment   . Sleep apnea    uses dental appliance    Past Surgical History:  Procedure Laterality Date  . APPENDECTOMY    . 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 ORIF HUMERUS FRACTURE Right 01/12/2019   Procedure: OPEN REDUCTION INTERNAL FIXATION (ORIF) PROXIMAL HUMERUS FRACTURE;  Surgeon: Hiram Gash, MD;  Location: [place];  Service: Orthopedics;  Laterality: Right;  . RADIOACTIVE SEED GUIDED EXCISIONAL BREAST BIOPSY Right 11/07/2016   Procedure: RADIOACTIVE SEED GUIDED EXCISIONAL BREAST BIOPSY;  Surgeon: Rolm Bookbinder, MD;  Location: [place];  Service: General;  Laterality: Right;  . TUBAL LIGATION      There were no vitals filed for this visit.  Subjective Assessment - 04/30/19 1835    Subjective   Pt reports mild hand pain    Pertinent History  sleep apnea, bipolar disorder,s/p R humeral fx and ORIF on 01/12/2019 (injured 01/11/19) see MD next 6/9 (arm in sling x6 weeks); hearing impaired    Limitations  s/p R humeral fx and ORIF on 01/12/2019 (injured 01/11/19) see MD next 6/9 (arm in sling x6 weeks); hearing impaired    Currently in Pain?  Yes    Pain Score  3     Pain Location  Hand    Pain Orientation  Right    Pain Descriptors / Indicators  Aching    Pain Type  Acute pain    Pain Onset  More than a month ago    Pain Frequency  Intermittent    Aggravating Factors   use    Pain Relieving Factors  heat, rest          CLINIC OPERATION CHANGES: Outpatient Neuro Rehab is open at lower capacity following universal masking, social distancing, and patient screening.  The patient's COVID risk of complications score is 1.  Fludio x 11 min to right hand for pain, stiffness, desensitization with no adverse reactions.   AROM  composite flex, Place and holds in composite finger flex. Finger thumb opposition, 5 reps each Wringing out a towel,  Simulated, min v.c Prayer stretch for composite wrist/finger ext. Wrist flex/ext with 1lb weight x10 each, then supination/ pronation with light hammer 10 reps each, min v.c Gripper set at level 1 to pick up 1 inch blocks min difficulty/ drops/ v.c Graded clothespins for sustained pinch, min difficulty pt performed yellow to blue.  OT Short Term Goals - 04/28/19 1137      OT SHORT TERM GOAL #1   Title  Pt will be independent with HEP for R hand/wrist ROM.--check STGs 04/30/19 (extend through 05/31/19 due to decr frequency)    Time  4    Period  Weeks    Status  New      OT SHORT TERM GOAL #2   Title  Pt will demo at least 90% R finger flex for improved grasp of objects.    Baseline  50%    Time  4    Period  Weeks    Status  New      OT SHORT TERM GOAL #3   Title  Pt will demo at least 60* R wrist extension for ADLs.    Baseline  40*    Period  Weeks    Status  New      OT SHORT TERM GOAL #4   Title  Pt will report R hand/wrist pain less than or equal to 4/10 for BADLs.    Time  4    Period   Weeks    Status  New      OT SHORT TERM GOAL #5   Title  Pt will use RUE as dominant UE for BADLs at least 50% of the time.    Time  4    Period  Weeks    Status  New        OT Long Term Goals - 03/31/19 1039      OT LONG TERM GOAL #1   Title  Pt will be independent with stengthening HEP for R hand/wrist.--check LTGs 05/30/19    Time  8    Period  Weeks    Status  New      OT LONG TERM GOAL #2   Title  Pt will demo at least 35lbs R grip strength for opening containers/picking up objects.    Baseline  13lbs    Time  8    Period  Weeks    Status  New      OT LONG TERM GOAL #3   Title  Pt will report pain in R hand/wrist less than or equal to 3/10 for light ADLs.    Time  8    Period  Weeks    Status  New      OT LONG TERM GOAL #4   Title  Pt will use RUE as dominant UE for ADLs at least 90% of the time.    Time  8    Period  Weeks    Status  New      OT LONG TERM GOAL #5   Title  Pt will demo at least 5lb improvement in lateral pinch strength and at least 4lb improvement in 3point pinch strength for ADLs.    Baseline  lateral 5.5lbs, 3point 4lbs    Time  8    Period  Weeks    Status  New            Plan - 04/30/19 1833    Clinical Impression Statement  Pt demonstrates improving RUE ROM and functional use.    OT Occupational Profile and History  Problem Focused Assessment - Including review of records relating to presenting problem    Occupational performance deficits (Please refer to evaluation for details):  ADL's;IADL's;Rest and Sleep;Leisure;Social Participation    Body Structure / Function / Physical Skills  ADL;Coordination;UE functional use;IADL;Pain;FMC;Strength;ROM;Edema;Sensation  Rehab Potential  Good    Clinical Decision Making  Several treatment options, min-mod task modification necessary    Comorbidities Affecting Occupational Performance:  May have comorbidities impacting occupational performance    Modification or Assistance to Complete  Evaluation   No modification of tasks or assist necessary to complete eval    OT Frequency  2x / week    OT Duration  8 weeks   +eval   OT Treatment/Interventions  Self-care/ADL training;Electrical Stimulation;Therapeutic exercise;Patient/family education;Splinting;Neuromuscular education;Paraffin;Moist Heat;Fluidtherapy;Energy conservation;Therapeutic activities;Passive range of motion;Manual Therapy;DME and/or AE instruction;Contrast Bath;Ultrasound;Cryotherapy    Plan  fludio, A/PROM, gentle strengthening (pt to bring in yellow putty she received from PT)--issue HEP prn    OT Home Exercise Plan  Education provided:  initial HEP 6/1    Recommended Other Services  seeing ortho PT for R shoulder    Consulted and Agree with Plan of Care  Patient       Patient will benefit from skilled therapeutic intervention in order to improve the following deficits and impairments:   Body Structure / Function / Physical Skills: ADL, Coordination, UE functional use, IADL, Pain, FMC, Strength, ROM, Edema, Sensation       Visit Diagnosis: 1. Muscle weakness (generalized)   2. Pain in right hand   3. Stiffness of right hand, not elsewhere classified   4. Stiffness of right wrist, not elsewhere classified       Problem List Patient Active Problem List   Diagnosis Date Noted  . Post-op pain 12/27/2016  . OSA (obstructive sleep apnea) 08/25/2014    Kimberly Haynes 04/30/2019, 6:39 PM  [facility] [address] [place], [place], [address] Phone: [phone]   Fax:  [phone]  Name: Kimberly Haynes MRN: [id number] Date of Birth: 1961-06-27

## 2019-04-30 NOTE — Therapy (Signed)
[facility] [place], [place], [address] Phone: [phone]   Fax:  [phone]  Physical Therapy Treatment  Patient Details  Name: Kimberly Haynes MRN: [id number] Date of Birth: [birth year] Referring Provider (PT): Dr Ophelia Charter    Encounter Date: 04/30/2019  PT End of Session - 04/30/19 1042    Visit Number  30    Number of Visits  36    Date for PT Re-Evaluation  05/30/19    Authorization Type  [place] [employer], no VL    PT Start Time  1042    PT Stop Time  1138    PT Time Calculation (min)  56 min    Activity Tolerance  Patient tolerated treatment well    Behavior During Therapy  [facility] for tasks assessed/performed       Past Medical History:  Diagnosis Date  . Bipolar disorder ([place])   . Depression   . Hearing impairment   . Sleep apnea    uses dental appliance    Past Surgical History:  Procedure Laterality Date  . APPENDECTOMY    . 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 ORIF HUMERUS FRACTURE Right 01/12/2019   Procedure: OPEN REDUCTION INTERNAL FIXATION (ORIF) PROXIMAL HUMERUS FRACTURE;  Surgeon: Hiram Gash, MD;  Location: [place];  Service: Orthopedics;  Laterality: Right;  . RADIOACTIVE SEED GUIDED EXCISIONAL BREAST BIOPSY Right 11/07/2016   Procedure: RADIOACTIVE SEED GUIDED EXCISIONAL BREAST BIOPSY;  Surgeon: Rolm Bookbinder, MD;  Location: [place];  Service: General;  Laterality: Right;  . TUBAL LIGATION      There were no vitals filed for this visit.  Subjective Assessment - 04/30/19 1043    Subjective  No new complaints. Arm is feeling pretty good         [facility] PT Assessment - 04/30/19 0001      PROM   Right Shoulder Flexion  140 Degrees   supine   Right Shoulder External Rotation  55 Degrees                   [facility] Adult PT  Treatment/Exercise - 04/30/19 0001      Shoulder Exercises: Seated   Other Seated Exercises  wall ladinto abduction x10 to 120 degrees der       Shoulder Exercises: Standing   Flexion  Right;12 reps    Shoulder Flexion Weight (lbs)  2    ABduction  Right;15 reps    Shoulder ABduction Weight (lbs)  2    Extension  20 reps    Extension Weight (lbs)  2    Other Standing Exercises  wall slides  2 x 10   reaching to cabinet 3# x 10 reps    Other Standing Exercises  green band x 20 forward and backward mimic tennis swing fore and back hand      Shoulder Exercises: Pulleys   Flexion  3 minutes      Shoulder Exercises: ROM/Strengthening   UBE (Upper Arm Bike)  3 min for 3 min back L 2.5    [place]  flexion and scaption and circles X 15 ea  with 5 pounds on wrist.      HMP x 12 min at end         PT Short Term Goals - 02/27/19 1153      PT  SHORT TERM GOAL #1   Title  Patient will demonstrate passive shoulder flexion to 60 degrees     Baseline  96    Time  4    Period  Weeks    Status  Achieved    Target Date  02/20/19      PT SHORT TERM GOAL #2   Title  Patient will begin AAROM to the right shoulder ( per MD guidlines)    Baseline  will go to the MD next week     Time  4    Period  Weeks    Status  Achieved    Target Date  02/20/19      PT SHORT TERM GOAL #3   Title  Patient will demonstrate 30 degrees of passive ROM     Baseline  17 degrees     Time  4    Period  Weeks    Status  Achieved        PT Long Term Goals - 04/30/19 1042      PT LONG TERM GOAL #1   Title  Patient will reach behind her head with right hand to brush her hair     Status  Achieved      PT LONG TERM GOAL #2   Title  Patient will use her right arm to feed herself     Status  Achieved      PT LONG TERM GOAL #3   Title  Patient will tuck her shirt in behind her without increased pain with her right arm     Baseline  able to get to sacrum  but incr pain    Status  Partially Met       PT LONG TERM GOAL #4   Title  She will be able to lift 3 pounds to head height with no pain to access upper cabinets    Status  Achieved      PT LONG TERM GOAL #5   Title  She will report able to dress with min modification and perform selfcare normally below shoulder height    Status  On-going      PT LONG TERM GOAL #6   Title  She will be able to carry 10 pounds with RT arm and place on counter top with no pain            Plan - 04/30/19 1042    Clinical Impression Statement  She is doing well with min pain these days.  Her passive ROM is improved    PT Treatment/Interventions  ADLs/Self Care Home Management;Electrical Stimulation;Cryotherapy;Canalith Repostioning;Moist Heat;Ultrasound;DME Instruction;Gait training;Functional mobility training;Stair training;Therapeutic activities;Therapeutic exercise;Neuromuscular re-education;Patient/family education;Manual techniques;Passive range of motion;Taping    PT Next Visit Plan  contniue strength and ROm to tolerance    PT Home Exercise Plan  pendulum; towel squeeze; elbow pronation/supination, self PROM supine, tableslide PROM (sliding body away opposed to sliding arm fwd) added putty exercises and wrist flex/ext, pro/sup    Consulted and Agree with Plan of Care  Patient       Patient will benefit from skilled therapeutic intervention in order to improve the following deficits and impairments:  Pain, Decreased activity tolerance, Decreased endurance, Decreased strength, Decreased range of motion, Impaired UE functional use, Decreased knowledge of precautions, Decreased safety awareness  Visit Diagnosis: 1. Muscle weakness (generalized)        Problem List Patient Active Problem List   Diagnosis Date Noted  . Post-op pain 12/27/2016  . OSA (  obstructive sleep apnea) 08/25/2014    Darrel Hoover  PT 04/30/2019, 11:31 AM  [facility] [address] [place], [place],  [address] Phone: [phone]   Fax:  [phone]  Name: Kimberly Haynes MRN: [id number] Date of Birth: May 08, 1961

## 2019-05-02 ENCOUNTER — Encounter

## 2019-05-19 MED FILL — SUBVENITE 100 MG TABS: 100 | 90 days supply | Qty: 90 | Fill #0

## 2019-05-19 MED FILL — AMLODIPINE BESYLATE 5 MG TA: 5 | 90 days supply | Qty: 90 | Fill #0

## 2019-05-19 MED FILL — OXcarbazepine 300 MG TABS: 300 | 31 days supply | Qty: 220 | Fill #1

## 2019-05-19 MED FILL — LITHIUM CARBONATE 150 MG CA: 150 | 90 days supply | Qty: 90 | Fill #0

## 2019-05-19 MED FILL — METOPROLOL SUCCINATE ER 100: 100 | 90 days supply | Qty: 90 | Fill #1

## 2019-05-21 ENCOUNTER — Ambulatory Visit: Payer: 59

## 2019-05-21 ENCOUNTER — Encounter: Payer: Self-pay | Admitting: Occupational Therapy

## 2019-05-21 ENCOUNTER — Ambulatory Visit: Payer: 59 | Admitting: Occupational Therapy

## 2019-05-21 ENCOUNTER — Other Ambulatory Visit: Payer: Self-pay

## 2019-05-21 DIAGNOSIS — M79641 Pain in right hand: Secondary | ICD-10-CM | POA: Diagnosis not present

## 2019-05-21 DIAGNOSIS — M25611 Stiffness of right shoulder, not elsewhere classified: Secondary | ICD-10-CM | POA: Diagnosis not present

## 2019-05-21 DIAGNOSIS — M25631 Stiffness of right wrist, not elsewhere classified: Secondary | ICD-10-CM

## 2019-05-21 DIAGNOSIS — M6281 Muscle weakness (generalized): Secondary | ICD-10-CM

## 2019-05-21 DIAGNOSIS — M25531 Pain in right wrist: Secondary | ICD-10-CM

## 2019-05-21 DIAGNOSIS — R208 Other disturbances of skin sensation: Secondary | ICD-10-CM | POA: Diagnosis not present

## 2019-05-21 DIAGNOSIS — M25641 Stiffness of right hand, not elsewhere classified: Secondary | ICD-10-CM | POA: Diagnosis not present

## 2019-05-21 NOTE — Therapy (Signed)
[place], [place], [address] Phone: [phone]   Fax:  [phone]  Physical Therapy Treatment  Patient Details  Name: Kimberly Haynes MRN: [id number] Date of Birth: 04/23/1961 Referring Provider (PT): Dr Ophelia Charter    Encounter Date: 05/21/2019  PT End of Session - 05/21/19 0938    Visit Number  31    Number of Visits  40    Date for PT Re-Evaluation  06/20/19    Authorization Type  [employer] UMR, no VL    PT Start Time  0930    PT Stop Time  1020    PT Time Calculation (min)  50 min    Activity Tolerance  Patient tolerated treatment well    Behavior During Therapy  [facility] for tasks assessed/performed       Past Medical History:  Diagnosis Date  . Bipolar disorder ([place])   . Depression   . Hearing impairment   . Sleep apnea    uses dental appliance    Past Surgical History:  Procedure Laterality Date  . APPENDECTOMY    . 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 ORIF HUMERUS FRACTURE Right 01/12/2019   Procedure: OPEN REDUCTION INTERNAL FIXATION (ORIF) PROXIMAL HUMERUS FRACTURE;  Surgeon: Hiram Gash, MD;  Location: [place];  Service: Orthopedics;  Laterality: Right;  . RADIOACTIVE SEED GUIDED EXCISIONAL BREAST BIOPSY Right 11/07/2016   Procedure: RADIOACTIVE SEED GUIDED EXCISIONAL BREAST BIOPSY;  Surgeon: Rolm Bookbinder, MD;  Location: [place];  Service: General;  Laterality: Right;  . TUBAL LIGATION      There were no vitals filed for this visit.  Subjective Assessment - 05/21/19 0936    Subjective  No pain . Had good trip    Currently in Pain?  No/denies         [facility] PT Assessment - 05/21/19 0001      AROM   Right Shoulder Flexion  112 Degrees    Right Shoulder ABduction  115 Degrees   slightly into scaption   Right Shoulder Internal  Rotation  32 Degrees   12 inches less compared to Lt   Right Shoulder External Rotation  58 Degrees    Right Shoulder Horizontal ABduction  10 Degrees    Right Shoulder Horizontal  ADduction  95 Degrees      PROM   Right Shoulder Flexion  134 Degrees   sitting   Right Shoulder ABduction  140 Degrees    Right Shoulder External Rotation  63 Degrees      Strength   Strength Assessment Site  Shoulder    Right/Left Shoulder  Right    Right Shoulder Flexion  3+/5    Right Shoulder Extension  4+/5    Right Shoulder ABduction  3+/5    Right Shoulder Internal Rotation  4+/5    Right Shoulder External Rotation  4/5    Right Shoulder Horizontal ABduction  4+/5    Right Shoulder Horizontal ADduction  4+/5                   [facility] Adult PT Treatment/Exercise - 05/21/19 0001      Shoulder Exercises: Seated   Other Seated Exercises  AAROm for ER and abduction and flesion.  multiple reps         2 pounds overhead 2x15 , Active  With end range stretching ER and abduciton         PT Short Term Goals - 02/27/19 1153      PT SHORT TERM GOAL #1   Title  Patient will demonstrate passive shoulder flexion to 60 degrees     Baseline  96    Time  4    Period  Weeks    Status  Achieved    Target Date  02/20/19      PT SHORT TERM GOAL #2   Title  Patient will begin AAROM to the right shoulder ( per MD guidlines)    Baseline  will go to the MD next week     Time  4    Period  Weeks    Status  Achieved    Target Date  02/20/19      PT SHORT TERM GOAL #3   Title  Patient will demonstrate 30 degrees of passive ROM     Baseline  17 degrees     Time  4    Period  Weeks    Status  Achieved        PT Long Term Goals - 05/21/19 0940      PT LONG TERM GOAL #1   Title  Patient will reach behind her head with right hand to brush her hair     Status  Achieved      PT LONG TERM GOAL #2   Title  Patient will use her right arm to feed herself     Status  Achieved      PT  LONG TERM GOAL #3   Title  Patient will tuck her shirt in behind her without increased pain with her right arm     Baseline  able to get to sacrum  but incr pain    Status  On-going      PT LONG TERM GOAL #4   Title  She will be able to lift 3 pounds to head height with no pain to access upper cabinets    Status  Achieved      PT LONG TERM GOAL #5   Title  She will report able to dress with min modification and perform selfcare normally below shoulder height    Status  Achieved      PT LONG TERM GOAL #6   Title  She will be able to carry 10 pounds with RT arm and place on counter top with no pain    Status  Achieved            Plan - 05/21/19 1014    Clinical Impression Statement  ROM improved but continue  stiffness and weakness  more with ER and flexion. She should  continue to improve wiht further skilled PT.    Personal Factors and Comorbidities  Age    PT Next Visit Plan  contniue strength and ROm to tolerance, HEP bands    PT Home Exercise Plan  pendulum; towel squeeze; elbow pronation/supination, self PROM supine, tableslide PROM (sliding body away opposed to sliding arm fwd) added putty exercises and wrist flex/ext, pro/sup    Consulted and Agree with Plan of Care  Patient       Patient will benefit from skilled therapeutic intervention in order to improve the following deficits and impairments:  Pain, Decreased activity tolerance, Decreased endurance, Decreased strength, Decreased range of motion, Impaired UE functional use, Decreased knowledge of precautions, Decreased safety awareness  Visit Diagnosis: 1. Muscle weakness (  generalized)   2. Stiffness of right shoulder, not elsewhere classified        Problem List Patient Active Problem List   Diagnosis Date Noted  . Post-op pain 12/27/2016  . OSA (obstructive sleep apnea) 08/25/2014    Kimberly Haynes  PT 05/21/2019, 10:17 AM  [facility] [address] [place], [place], [address] Phone: [phone]   Fax:  [phone]  Name: Kimberly Haynes MRN: [id number] Date of Birth: Feb 28, 1961

## 2019-05-21 NOTE — Therapy (Signed)
[place] [address] [place], [place], [address] Phone: [phone]   Fax:  [phone]  Occupational Therapy Treatment  Patient Details  Name: Kimberly Haynes MRN: [id number] Date of Birth: 1960/11/02 Referring Provider (OT): Dr. Ophelia Charter   Encounter Date: 05/21/2019  OT End of Session - 05/21/19 0811    Visit Number  4    Number of Visits  13    Date for OT Re-Evaluation  05/15/19    Authorization Type  [employer], no visit limit/authorization required    OT Start Time  0807    OT Stop Time  0847    OT Time Calculation (min)  40 min    Activity Tolerance  Patient tolerated treatment well    Behavior During Therapy  [facility] for tasks assessed/performed       Past Medical History:  Diagnosis Date  . Bipolar disorder ([place])   . Depression   . Hearing impairment   . Sleep apnea    uses dental appliance    Past Surgical History:  Procedure Laterality Date  . APPENDECTOMY    . 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 ORIF HUMERUS FRACTURE Right 01/12/2019   Procedure: OPEN REDUCTION INTERNAL FIXATION (ORIF) PROXIMAL HUMERUS FRACTURE;  Surgeon: Hiram Gash, MD;  Location: [place];  Service: Orthopedics;  Laterality: Right;  . RADIOACTIVE SEED GUIDED EXCISIONAL BREAST BIOPSY Right 11/07/2016   Procedure: RADIOACTIVE SEED GUIDED EXCISIONAL BREAST BIOPSY;  Surgeon: Rolm Bookbinder, MD;  Location: [place];  Service: General;  Laterality: Right;  . TUBAL LIGATION      There were no vitals filed for this visit.  Subjective Assessment - 05/21/19 0809    Subjective   It only hurts when I use it a lot.  It's a lot better.  I can chop some things but still have difficulty due to weakness in shoulder and difficulty holding the knife tight.    Pertinent History  sleep  apnea, bipolar disorder,s/p R humeral fx and ORIF on 01/12/2019 (injured 01/11/19) see MD next 6/9 (arm in sling x6 weeks); hearing impaired    Limitations  s/p R humeral fx and ORIF on 01/12/2019 (injured 01/11/19) see MD next 6/9 (arm in sling x6 weeks); hearing impaired    Currently in Pain?  No/denies    Pain Onset  More than a month ago         CLINIC OPERATION CHANGES: Outpatient [facility] is open at lower capacity following universal masking, social distancing, and patient screening.  The patient's COVID risk of complications score is 1.  Fludio x 11 min to right hand for pain, stiffness, desensitization with no adverse reactions.    AROM  Tendon glides, Finger thumb opposition Prayer stretch for composite wrist/finger ext. Forearm gym for incr wrist AROM with min difficulty. Wrist flex/ext with 1lb weight x15 each, then supination/ pronation with light hammer 15 reps each, min v.c Gripper set at level 1 to pick up 1 inch blocks min difficulty for incr sustained grip strength            OT Short Term Goals - 04/28/19 1137      OT SHORT TERM GOAL #1   Title  Pt will be independent with HEP for R hand/wrist ROM.--check STGs 04/30/19 (extend through 05/31/19 due to decr frequency)  Time  4    Period  Weeks    Status  New      OT SHORT TERM GOAL #2   Title  Pt will demo at least 90% R finger flex for improved grasp of objects.    Baseline  50%    Time  4    Period  Weeks    Status  New      OT SHORT TERM GOAL #3   Title  Pt will demo at least 60* R wrist extension for ADLs.    Baseline  40*    Period  Weeks    Status  New      OT SHORT TERM GOAL #4   Title  Pt will report R hand/wrist pain less than or equal to 4/10 for BADLs.    Time  4    Period  Weeks    Status  New      OT SHORT TERM GOAL #5   Title  Pt will use RUE as dominant UE for BADLs at least 50% of the time.    Time  4    Period  Weeks    Status  New        OT Long Term Goals - 03/31/19 1039       OT LONG TERM GOAL #1   Title  Pt will be independent with stengthening HEP for R hand/wrist.--check LTGs 05/30/19    Time  8    Period  Weeks    Status  New      OT LONG TERM GOAL #2   Title  Pt will demo at least 35lbs R grip strength for opening containers/picking up objects.    Baseline  13lbs    Time  8    Period  Weeks    Status  New      OT LONG TERM GOAL #3   Title  Pt will report pain in R hand/wrist less than or equal to 3/10 for light ADLs.    Time  8    Period  Weeks    Status  New      OT LONG TERM GOAL #4   Title  Pt will use RUE as dominant UE for ADLs at least 90% of the time.    Time  8    Period  Weeks    Status  New      OT LONG TERM GOAL #5   Title  Pt will demo at least 5lb improvement in lateral pinch strength and at least 4lb improvement in 3point pinch strength for ADLs.    Baseline  lateral 5.5lbs, 3point 4lbs    Time  8    Period  Weeks    Status  New            Plan - 05/21/19 4562    Clinical Impression Statement  Pt demonstrates improving RUE ROM, strength, functional use and less pain overall.    OT Occupational Profile and History  Problem Focused Assessment - Including review of records relating to presenting problem    Occupational performance deficits (Please refer to evaluation for details):  ADL's;IADL's;Rest and Sleep;Leisure;Social Participation    Body Structure / Function / Physical Skills  ADL;Coordination;UE functional use;IADL;Pain;FMC;Strength;ROM;Edema;Sensation    Rehab Potential  Good    Clinical Decision Making  Several treatment options, min-mod task modification necessary    Comorbidities Affecting Occupational Performance:  May have comorbidities impacting occupational performance    Modification or Assistance to  Complete Evaluation   No modification of tasks or assist necessary to complete eval    OT Frequency  2x / week    OT Duration  8 weeks   +eval   OT Treatment/Interventions  Self-care/ADL  training;Electrical Stimulation;Therapeutic exercise;Patient/family education;Splinting;Neuromuscular education;Paraffin;Moist Heat;Fluidtherapy;Energy conservation;Therapeutic activities;Passive range of motion;Manual Therapy;DME and/or AE instruction;Contrast Bath;Ultrasound;Cryotherapy    Plan  fludio, A/PROM, gentle strengthening (pt to bring in yellow putty she received from PT as she forgot today)--issue HEP prn    OT Home Exercise Plan  Education provided:  initial HEP 6/1    Recommended Other Services  seeing ortho PT for R shoulder    Consulted and Agree with Plan of Care  Patient       Patient will benefit from skilled therapeutic intervention in order to improve the following deficits and impairments:   Body Structure / Function / Physical Skills: ADL, Coordination, UE functional use, IADL, Pain, FMC, Strength, ROM, Edema, Sensation       Visit Diagnosis: 1. Muscle weakness (generalized)   2. Pain in right hand   3. Stiffness of right hand, not elsewhere classified   4. Stiffness of right wrist, not elsewhere classified   5. Pain in right wrist   6. Other disturbances of skin sensation       Problem List Patient Active Problem List   Diagnosis Date Noted  . Post-op pain 12/27/2016  . OSA (obstructive sleep apnea) 08/25/2014    [facility] 05/21/2019, 9:02 AM  [place] [address] [place], [place], [address] Phone: [phone]   Fax:  [phone]  Name: Madyn Ivins MRN: [id number] Date of Birth: Oct 25, 1961   Vianne Bulls, OTR/L [facility] [address] [place], [place]  [address] [phone] phone [phone] 05/21/19 9:02 AM

## 2019-05-22 ENCOUNTER — Ambulatory Visit: Payer: 59

## 2019-05-22 DIAGNOSIS — M79641 Pain in right hand: Secondary | ICD-10-CM | POA: Diagnosis not present

## 2019-05-22 DIAGNOSIS — M25531 Pain in right wrist: Secondary | ICD-10-CM | POA: Diagnosis not present

## 2019-05-22 DIAGNOSIS — M25641 Stiffness of right hand, not elsewhere classified: Secondary | ICD-10-CM | POA: Diagnosis not present

## 2019-05-22 DIAGNOSIS — M25631 Stiffness of right wrist, not elsewhere classified: Secondary | ICD-10-CM | POA: Diagnosis not present

## 2019-05-22 DIAGNOSIS — M6281 Muscle weakness (generalized): Secondary | ICD-10-CM | POA: Diagnosis not present

## 2019-05-22 DIAGNOSIS — M25611 Stiffness of right shoulder, not elsewhere classified: Secondary | ICD-10-CM | POA: Diagnosis not present

## 2019-05-22 DIAGNOSIS — R208 Other disturbances of skin sensation: Secondary | ICD-10-CM | POA: Diagnosis not present

## 2019-05-22 NOTE — Therapy (Signed)
[place], [place], [address] Phone: [phone]   Fax:  [phone]  Physical Therapy Treatment  Patient Details  Name: Azarie Coriz MRN: [id number] Date of Birth: 1961/05/24 Referring Provider (PT): Dr Ophelia Charter    Encounter Date: 05/22/2019  PT End of Session - 05/22/19 1014    Visit Number  32    Number of Visits  40    Date for PT Re-Evaluation  06/20/19    Authorization Type  [employer]MR, no VL    PT Start Time  1010    PT Stop Time  1050    PT Time Calculation (min)  40 min    Activity Tolerance  Patient tolerated treatment well    Behavior During Therapy  [facility] for tasks assessed/performed       Past Medical History:  Diagnosis Date  . Bipolar disorder ([place])   . Depression   . Hearing impairment   . Sleep apnea    uses dental appliance    Past Surgical History:  Procedure Laterality Date  . APPENDECTOMY    . 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 ORIF HUMERUS FRACTURE Right 01/12/2019   Procedure: OPEN REDUCTION INTERNAL FIXATION (ORIF) PROXIMAL HUMERUS FRACTURE;  Surgeon: Hiram Gash, MD;  Location: [place];  Service: Orthopedics;  Laterality: Right;  . RADIOACTIVE SEED GUIDED EXCISIONAL BREAST BIOPSY Right 11/07/2016   Procedure: RADIOACTIVE SEED GUIDED EXCISIONAL BREAST BIOPSY;  Surgeon: Rolm Bookbinder, MD;  Location: [place];  Service: General;  Laterality: Right;  . TUBAL LIGATION      There were no vitals filed for this visit.  Subjective Assessment - 05/22/19 1206    Subjective  No pain . MD tomorrow.    Currently in Pain?  No/denies                       [facility] Adult PT Treatment/Exercise - 05/22/19 0001      Shoulder Exercises: Standing   Flexion  Right;Left;15 reps    Shoulder Flexion Weight (lbs)  3    ABduction  Right;Left;15 reps    Shoulder ABduction Weight (lbs)  3    Extension  Right;Left;20 reps    Extension Weight (lbs)  3    Other Standing Exercises  counter push ups x 20    Other Standing Exercises  green band x 20 forward and backward mimic tennis swing fore and back hand red band rockwood x 20      Shoulder Exercises: ROM/Strengthening   UBE (Upper Arm Bike)  3 min for 3 min back L 2.5      Manual Therapy   Joint Mobilization  Grade 4 and III AP and inferior glides to improve ER/flexion;      Passive ROM  Right shoulder flexion, abduction, ER emphasis, GH mobs gentle grade 1-2  inferior, distraction, A-P             PT Education - 05/22/19 1018    Education Details  [place]OCK[place] hep    Person(s) Educated  Patient    Methods  Explanation;Demonstration;Tactile cues;Verbal cues;Handout    Comprehension  Returned demonstration;Verbalized understanding       PT Short Term Goals - 02/27/19 1153      PT SHORT TERM GOAL #1   Title  Patient will demonstrate passive  shoulder flexion to 60 degrees     Baseline  96    Time  4    Period  Weeks    Status  Achieved    Target Date  02/20/19      PT SHORT TERM GOAL #2   Title  Patient will begin AAROM to the right shoulder ( per MD guidlines)    Baseline  will go to the MD next week     Time  4    Period  Weeks    Status  Achieved    Target Date  02/20/19      PT SHORT TERM GOAL #3   Title  Patient will demonstrate 30 degrees of passive ROM     Baseline  17 degrees     Time  4    Period  Weeks    Status  Achieved        PT Long Term Goals - 05/21/19 0940      PT LONG TERM GOAL #1   Title  Patient will reach behind her head with right hand to brush her hair     Status  Achieved      PT LONG TERM GOAL #2   Title  Patient will use her right arm to feed herself     Status  Achieved      PT LONG TERM GOAL #3   Title  Patient will tuck her shirt in behind her without increased pain with her right arm      Baseline  able to get to sacrum  but incr pain    Status  On-going      PT LONG TERM GOAL #4   Title  She will be able to lift 3 pounds to head height with no pain to access upper cabinets    Status  Achieved      PT LONG TERM GOAL #5   Title  She will report able to dress with min modification and perform selfcare normally below shoulder height    Status  Achieved      PT LONG TERM GOAL #6   Title  She will be able to carry 10 pounds with RT arm and place on counter top with no pain    Status  Achieved            Plan - 05/22/19 1015    Clinical Impression Statement  Tolerated exercises and stretching without complaint. Rotation most limited motions    PT Treatment/Interventions  ADLs/Self Care Home Management;Electrical Stimulation;Cryotherapy;Canalith Repostioning;Moist Heat;Ultrasound;DME Instruction;Gait training;Functional mobility training;Stair training;Therapeutic activities;Therapeutic exercise;Neuromuscular re-education;Patient/family education;Manual techniques;Passive range of motion;Taping    PT Next Visit Plan  contniue strength and ROm to tolerance, HEP bands    PT Home Exercise Plan  pendulum; towel squeeze; elbow pronation/supination, self PROM supine, tableslide PROM (sliding body away opposed to sliding arm fwd) added putty exercises and wrist flex/ext, pro/sup    rockwood red band    Consulted and Agree with Plan of Care  Patient       Patient will benefit from skilled therapeutic intervention in order to improve the following deficits and impairments:  Pain, Decreased activity tolerance, Decreased endurance, Decreased strength, Decreased range of motion, Impaired UE functional use, Decreased knowledge of precautions, Decreased safety awareness  Visit Diagnosis: 1. Stiffness of right shoulder, not elsewhere classified   2. Muscle weakness (generalized)        Problem List Patient Active Problem List   Diagnosis Date Noted  .  Post-op pain 12/27/2016  .  OSA (obstructive sleep apnea) 08/25/2014    Darrel Hoover  PT 05/22/2019, 12:08 PM  [facility] [address] [place], [place], [address] Phone: [phone]   Fax:  [phone]  Name: Keylani Perlstein MRN: [id number] Date of Birth: 1961/04/10

## 2019-05-22 NOTE — Patient Instructions (Signed)
Strengthening: Resisted Internal Rotation   Hold tubing in left hand, elbow at side and forearm out. Rotate forearm in across body. Repeat ____ times per set. Do ____ sets per session. Do ____ sessions per day.  http://orth.exer.us/830   Copyright  VHI. All rights reserved.  Strengthening: Resisted External Rotation   Hold tubing in right hand, elbow at side and forearm across body. Rotate forearm out. Repeat ____ times per set. Do ____ sets per session. Do ____ sessions per day.  http://orth.exer.us/828   Copyright  VHI. All rights reserved.  Strengthening: Resisted Flexion   Hold tubing with left arm at side. Pull forward and up. Move shoulder through pain-free range of motion. Repeat ____ times per set. Do ____ sets per session. Do ____ sessions per day.  http://orth.exer.us/824   Copyright  VHI. All rights reserved.  Strengthening: Resisted Extension   Hold tubing in right hand, arm forward. Pull arm back, elbow straight. Repeat ____ times per set. Do ____ sets per session. Do ____ sessions per day.  http://orth.exer.us/832   Copyright  VHI. All rights reserved.  ALL DONE 1-2X/DAY  15-20 REPS

## 2019-05-23 ENCOUNTER — Encounter: Payer: Self-pay | Admitting: Occupational Therapy

## 2019-05-23 DIAGNOSIS — S42291D Other displaced fracture of upper end of right humerus, subsequent encounter for fracture with routine healing: Secondary | ICD-10-CM | POA: Diagnosis not present

## 2019-05-24 ENCOUNTER — Encounter

## 2019-05-27 ENCOUNTER — Other Ambulatory Visit: Payer: Self-pay

## 2019-05-27 ENCOUNTER — Ambulatory Visit: Payer: 59

## 2019-05-27 ENCOUNTER — Ambulatory Visit: Payer: 59 | Admitting: Occupational Therapy

## 2019-05-27 DIAGNOSIS — M25611 Stiffness of right shoulder, not elsewhere classified: Secondary | ICD-10-CM

## 2019-05-27 DIAGNOSIS — R208 Other disturbances of skin sensation: Secondary | ICD-10-CM | POA: Diagnosis not present

## 2019-05-27 DIAGNOSIS — M6281 Muscle weakness (generalized): Secondary | ICD-10-CM

## 2019-05-27 DIAGNOSIS — M25531 Pain in right wrist: Secondary | ICD-10-CM

## 2019-05-27 DIAGNOSIS — M25641 Stiffness of right hand, not elsewhere classified: Secondary | ICD-10-CM

## 2019-05-27 DIAGNOSIS — M25631 Stiffness of right wrist, not elsewhere classified: Secondary | ICD-10-CM | POA: Diagnosis not present

## 2019-05-27 DIAGNOSIS — M79641 Pain in right hand: Secondary | ICD-10-CM

## 2019-05-27 NOTE — Therapy (Signed)
[facility] [address] [place], [place], [address] Phone: [phone]   Fax:  [phone]  Occupational Therapy Treatment  Patient Details  Name: Kimberly Haynes MRN: [id number] Date of Birth: 07-29-1961 Referring Provider (OT): Dr. Ophelia Charter   Encounter Date: 05/27/2019  OT End of Session - 05/27/19 0839    Visit Number  5    Number of Visits  13    Date for OT Re-Evaluation  05/15/19    Authorization Type  [employer], no visit limit/authorization required    OT Start Time  0803    OT Stop Time  0845    OT Time Calculation (min)  42 min    Activity Tolerance  Patient tolerated treatment well    Behavior During Therapy  [facility] for tasks assessed/performed       Past Medical History:  Diagnosis Date  . Bipolar disorder ([place])   . Depression   . Hearing impairment   . Sleep apnea    uses dental appliance    Past Surgical History:  Procedure Laterality Date  . APPENDECTOMY    . 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 ORIF HUMERUS FRACTURE Right 01/12/2019   Procedure: OPEN REDUCTION INTERNAL FIXATION (ORIF) PROXIMAL HUMERUS FRACTURE;  Surgeon: Hiram Gash, MD;  Location: [place];  Service: Orthopedics;  Laterality: Right;  . RADIOACTIVE SEED GUIDED EXCISIONAL BREAST BIOPSY Right 11/07/2016   Procedure: RADIOACTIVE SEED GUIDED EXCISIONAL BREAST BIOPSY;  Surgeon: Rolm Bookbinder, MD;  Location: [facility];  Service: General;  Laterality: Right;  . TUBAL LIGATION      There were no vitals filed for this visit.  Subjective Assessment - 05/27/19 0804    Subjective   Pt reports that pain is better    Pertinent History  sleep apnea, bipolar disorder,s/p R humeral fx and ORIF on 01/12/2019 (injured 01/11/19) see MD next 6/9 (arm in sling x6 weeks); hearing impaired    Limitations  s/p R humeral fx and ORIF on 01/12/2019 (injured 01/11/19) see MD next 6/9 (arm in sling x6 weeks); hearing impaired    Currently in Pain?  Yes    Pain Score  2     Pain Location  Hand    Pain Orientation  Right    Pain Descriptors / Indicators  Aching    Pain Type  Acute pain    Pain Onset  More than a month ago    Pain Frequency  Intermittent    Aggravating Factors   malpositioning    Pain Relieving Factors  repositioning                   CLINIC OPERATION CHANGES: Outpatient Neuro Rehab is open at lower capacity following universal masking, social distancing, and patient screening.  The patient's COVID risk of complications score is 1.  Fludio x 11 min to right hand for pain, stiffness, desensitization with no adverse reactions.    AROM  Tendon glides,  Prayer stretch for composite wrist/finger ext. Wrist flex/ext with 2lb weight x10 each, then supination/ pronation with light hammer 10 reps each, min v.c Gripper set at level 1 to pick up 1 inch blocks min difficulty for incr sustained grip strength Graded clothespins for sustained pinch and functional reach, min difficulty Forearm gym, 3 reps each direction, for increased ROM  OT Education - 05/27/19 0844    Education Details  upgraded red putty HEP, pen rolling    Person(s) Educated  Patient    Methods  Explanation;Demonstration;Verbal cues;Handout    Comprehension  Verbalized understanding;Returned demonstration       OT Short Term Goals - 04/28/19 1137      OT SHORT TERM GOAL #1   Title  Pt will be independent with HEP for R hand/wrist ROM.--check STGs 04/30/19 (extend through 05/31/19 due to decr frequency)    Time  4    Period  Weeks    Status  New      OT SHORT TERM GOAL #2   Title  Pt will demo at least 90% R finger flex for improved grasp of objects.    Baseline  50%    Time  4    Period  Weeks    Status  New      OT SHORT TERM GOAL #3   Title  Pt will demo at  least 60* R wrist extension for ADLs.    Baseline  40*    Period  Weeks    Status  New      OT SHORT TERM GOAL #4   Title  Pt will report R hand/wrist pain less than or equal to 4/10 for BADLs.    Time  4    Period  Weeks    Status  New      OT SHORT TERM GOAL #5   Title  Pt will use RUE as dominant UE for BADLs at least 50% of the time.    Time  4    Period  Weeks    Status  New        OT Long Term Goals - 03/31/19 1039      OT LONG TERM GOAL #1   Title  Pt will be independent with stengthening HEP for R hand/wrist.--check LTGs 05/30/19    Time  8    Period  Weeks    Status  New      OT LONG TERM GOAL #2   Title  Pt will demo at least 35lbs R grip strength for opening containers/picking up objects.    Baseline  13lbs    Time  8    Period  Weeks    Status  New      OT LONG TERM GOAL #3   Title  Pt will report pain in R hand/wrist less than or equal to 3/10 for light ADLs.    Time  8    Period  Weeks    Status  New      OT LONG TERM GOAL #4   Title  Pt will use RUE as dominant UE for ADLs at least 90% of the time.    Time  8    Period  Weeks    Status  New      OT LONG TERM GOAL #5   Title  Pt will demo at least 5lb improvement in lateral pinch strength and at least 4lb improvement in 3point pinch strength for ADLs.    Baseline  lateral 5.5lbs, 3point 4lbs    Time  8    Period  Weeks    Status  New              Patient will benefit from skilled therapeutic intervention in order to improve the following deficits and impairments:           Visit  Diagnosis: 1. Muscle weakness (generalized)   2. Pain in right hand   3. Stiffness of right hand, not elsewhere classified   4. Pain in right wrist       Problem List Patient Active Problem List   Diagnosis Date Noted  . Post-op pain 12/27/2016  . OSA (obstructive sleep apnea) 08/25/2014    Royden Bulman 05/27/2019, 8:45 AM Theone Murdoch, OTR/L [phone] Phone: [phone] 8:46 AM  05/27/19 [facility] [address] [place], [place], [address] Phone: [phone]   Fax:  [phone]  Name: Kimberly Haynes MRN: [id number] Date of Birth: 1961/05/22

## 2019-05-27 NOTE — Patient Instructions (Signed)
1. Grip Strengthening (Resistive Putty)   Squeeze putty using thumb and all fingers. Repeat _20___ times. Do __2__ sessions per day.   2. Roll putty into tube on table and pinch between each finger and thumb x 10 reps each. (can do ring and small finger together)     Copyright  [employer]. All rights reserved.    Hold a pen in your finger tips, roll pen down into your palm, then back up 10 reps 1-2x day

## 2019-05-27 NOTE — Therapy (Signed)
[facility] [place], [place], [address] Phone: [phone]   Fax:  [phone]  Physical Therapy Treatment  Patient Details  Name: Kimberly Haynes MRN: [id number] Date of Birth: [birth year] Referring Provider (PT): Dr Ophelia Charter    Encounter Date: 05/27/2019  PT End of Session - 05/27/19 0941    Visit Number  33    Number of Visits  40    Date for PT Re-Evaluation  06/20/19    Authorization Type  [employer], no VL    PT Start Time  0900    PT Stop Time  0950    PT Time Calculation (min)  50 min    Activity Tolerance  Patient tolerated treatment well    Behavior During Therapy  [facility] for tasks assessed/performed       Past Medical History:  Diagnosis Date  . Bipolar disorder ([place])   . Depression   . Hearing impairment   . Sleep apnea    uses dental appliance    Past Surgical History:  Procedure Laterality Date  . APPENDECTOMY    . 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 ORIF HUMERUS FRACTURE Right 01/12/2019   Procedure: OPEN REDUCTION INTERNAL FIXATION (ORIF) PROXIMAL HUMERUS FRACTURE;  Surgeon: Hiram Gash, MD;  Location: [facility];  Service: Orthopedics;  Laterality: Right;  . RADIOACTIVE SEED GUIDED EXCISIONAL BREAST BIOPSY Right 11/07/2016   Procedure: RADIOACTIVE SEED GUIDED EXCISIONAL BREAST BIOPSY;  Surgeon: Rolm Bookbinder, MD;  Location: [facility];  Service: General;  Laterality: Right;  . TUBAL LIGATION      There were no vitals filed for this visit.  Subjective Assessment - 05/27/19 0906    Subjective  No pain MD pleased with progress. Stated bone is healed and can do anything on it.    Currently in Pain?  No/denies                       [facility] Adult PT Treatment/Exercise - 05/27/19 0001      Shoulder Exercises: Supine   Protraction  Strengthening;Right;20 reps    Protraction Weight (lbs)  7      Shoulder Exercises: Prone   Flexion  Right;15 reps    Flexion Weight (lbs)  2    Extension  Right;15 reps    Extension Weight (lbs)  2    Horizontal ABduction 1  15 reps;Right    Horizontal ABduction 1 Weight (lbs)  2      Shoulder Exercises: ROM/Strengthening   Rhythmic Stabilization, Supine  30 reps circles 7 # weight      Moist Heat Therapy   Number Minutes Moist Heat  10 Minutes    Moist Heat Location  Shoulder      Manual Therapy   Joint Mobilization  Grade 4 and III AP and inferior glides to improve ER/flexion;      Passive ROM  Right shoulder flexion, abduction, ER emphasis, GH mobs gentle grade 1-2  inferior, distraction, A-P, today done prone               PT Short Term Goals - 02/27/19 1153      PT SHORT TERM GOAL #1   Title  Patient will demonstrate passive shoulder flexion to 60 degrees     Baseline  96    Time  4  Period  Weeks    Status  Achieved    Target Date  02/20/19      PT SHORT TERM GOAL #2   Title  Patient will begin AAROM to the right shoulder ( per MD guidlines)    Baseline  will go to the MD next week     Time  4    Period  Weeks    Status  Achieved    Target Date  02/20/19      PT SHORT TERM GOAL #3   Title  Patient will demonstrate 30 degrees of passive ROM     Baseline  17 degrees     Time  4    Period  Weeks    Status  Achieved        PT Long Term Goals - 05/21/19 0940      PT LONG TERM GOAL #1   Title  Patient will reach behind her head with right hand to brush her hair     Status  Achieved      PT LONG TERM GOAL #2   Title  Patient will use her right arm to feed herself     Status  Achieved      PT LONG TERM GOAL #3   Title  Patient will tuck her shirt in behind her without increased pain with her right arm     Baseline  able to get to sacrum  but incr pain    Status  On-going      PT LONG TERM GOAL #4   Title  She will be able to  lift 3 pounds to head height with no pain to access upper cabinets    Status  Achieved      PT LONG TERM GOAL #5   Title  She will report able to dress with min modification and perform selfcare normally below shoulder height    Status  Achieved      PT LONG TERM GOAL #6   Title  She will be able to carry 10 pounds with RT arm and place on counter top with no pain    Status  Achieved            Plan - 05/27/19 0941    PT Treatment/Interventions  ADLs/Self Care Home Management;Electrical Stimulation;Cryotherapy;Canalith Repostioning;Moist Heat;Ultrasound;DME Instruction;Gait training;Functional mobility training;Stair training;Therapeutic activities;Therapeutic exercise;Neuromuscular re-education;Patient/family education;Manual techniques;Passive range of motion;Taping    PT Next Visit Plan  contniue strength and ROM to tolerance,    PT Home Exercise Plan  pendulum; towel squeeze; elbow pronation/supination, self PROM supine, tableslide PROM (sliding body away opposed to sliding arm fwd) added putty exercises and wrist flex/ext, pro/sup    rockwood red band    Consulted and Agree with Plan of Care  Patient       Patient will benefit from skilled therapeutic intervention in order to improve the following deficits and impairments:  Pain, Decreased activity tolerance, Decreased endurance, Decreased strength, Decreased range of motion, Impaired UE functional use, Decreased knowledge of precautions, Decreased safety awareness  Visit Diagnosis: 1. Stiffness of right shoulder, not elsewhere classified   2. Muscle weakness (generalized)        Problem List Patient Active Problem List   Diagnosis Date Noted  . Post-op pain 12/27/2016  . OSA (obstructive sleep apnea) 08/25/2014    Darrel Hoover  PT 05/27/2019, 9:42 AM  [facility] [address] [place], [place], [address] Phone: [phone]   Fax:  [phone]  Name:  Kimberly Haynes MRN: [id number] Date of Birth: 01/02/1961

## 2019-05-28 ENCOUNTER — Encounter: Payer: Self-pay | Admitting: Occupational Therapy

## 2019-05-28 ENCOUNTER — Ambulatory Visit: Payer: 59 | Admitting: Occupational Therapy

## 2019-05-28 DIAGNOSIS — M25631 Stiffness of right wrist, not elsewhere classified: Secondary | ICD-10-CM | POA: Diagnosis not present

## 2019-05-28 DIAGNOSIS — M25531 Pain in right wrist: Secondary | ICD-10-CM | POA: Diagnosis not present

## 2019-05-28 DIAGNOSIS — R208 Other disturbances of skin sensation: Secondary | ICD-10-CM

## 2019-05-28 DIAGNOSIS — M79641 Pain in right hand: Secondary | ICD-10-CM

## 2019-05-28 DIAGNOSIS — M6281 Muscle weakness (generalized): Secondary | ICD-10-CM

## 2019-05-28 DIAGNOSIS — M25611 Stiffness of right shoulder, not elsewhere classified: Secondary | ICD-10-CM | POA: Diagnosis not present

## 2019-05-28 DIAGNOSIS — M25641 Stiffness of right hand, not elsewhere classified: Secondary | ICD-10-CM

## 2019-05-28 NOTE — Therapy (Signed)
[facility] [address] [place], [place], [address] Phone: [phone]   Fax:  [phone]  Occupational Therapy Treatment  Patient Details  Name: Kimberly Haynes MRN: [id number] Date of Birth: July 18, 1961 Referring Provider (OT): Dr. Ophelia Charter   Encounter Date: 05/28/2019  OT End of Session - 05/28/19 0717    Visit Number  6    Number of Visits  13    Date for OT Re-Evaluation  05/15/19    Authorization Type  [employer], no visit limit/authorization required    OT Start Time  0808    OT Stop Time  0850    OT Time Calculation (min)  42 min    Activity Tolerance  Patient tolerated treatment well    Behavior During Therapy  [facility] for tasks assessed/performed       Past Medical History:  Diagnosis Date  . Bipolar disorder ([place])   . Depression   . Hearing impairment   . Sleep apnea    uses dental appliance    Past Surgical History:  Procedure Laterality Date  . APPENDECTOMY    . 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 ORIF HUMERUS FRACTURE Right 01/12/2019   Procedure: OPEN REDUCTION INTERNAL FIXATION (ORIF) PROXIMAL HUMERUS FRACTURE;  Surgeon: Hiram Gash, MD;  Location: [facility];  Service: Orthopedics;  Laterality: Right;  . RADIOACTIVE SEED GUIDED EXCISIONAL BREAST BIOPSY Right 11/07/2016   Procedure: RADIOACTIVE SEED GUIDED EXCISIONAL BREAST BIOPSY;  Surgeon: Rolm Bookbinder, MD;  Location: [facility];  Service: General;  Laterality: Right;  . TUBAL LIGATION      There were no vitals filed for this visit.  Subjective Assessment - 05/28/19 0713    Subjective   Pt reports that her hand is so much better    Pertinent History  sleep apnea, bipolar disorder,s/p R humeral fx and ORIF on 01/12/2019 (injured 01/11/19) see MD next 6/9 (arm in sling x6 weeks); hearing  impaired    Limitations  s/p R humeral fx and ORIF on 01/12/2019 (injured 01/11/19) see MD next 6/9 (arm in sling x6 weeks); hearing impaired    Currently in Pain?  Yes    Pain Score  2     Pain Location  Hand    Pain Orientation  Right    Pain Descriptors / Indicators  Aching    Pain Type  Acute pain    Pain Onset  More than a month ago    Pain Frequency  Intermittent    Aggravating Factors   malpositioning    Pain Relieving Factors  repositioning          CLINIC OPERATION CHANGES: Outpatient Neuro Rehab is open at lower capacity following universal masking, social distancing, and patient screening.  The patient's COVID risk of complications score is 1.  Fludio x 12 min to right hand for pain, stiffness, desensitization with no adverse reactions.    Began checking goals and discussing progress--see goals section below. AROM  Tendon glides x10 Prayer stretch for composite wrist/finger ext. Wrist flex/ext with 2lb weight x10 each, then supination/ pronation with light hammer 10 reps each, min v.c Red putty exercises for gross grip and individual finger pinch strength, finger ext to flatten putty, MP/intrinsic+ pull, lateral pinch Gripper set at level 1 to pick up 1 inch blocks min difficulty for incr sustained  grip strength          OT Short Term Goals - 05/28/19 0719      OT SHORT TERM GOAL #1   Title  Pt will be independent with HEP for R hand/wrist ROM.--check STGs 04/30/19 (extend through 05/31/19 due to decr frequency)    Time  4    Period  Weeks    Status  Achieved      OT SHORT TERM GOAL #2   Title  Pt will demo at least 90% R finger flex for improved grasp of objects.    Baseline  50%    Time  4    Period  Weeks    Status  Achieved   05/28/19:  after stretching 1-2x     OT SHORT TERM GOAL #3   Title  Pt will demo at least 60* R wrist extension for ADLs.    Baseline  40*    Period  Weeks    Status  Achieved   05/28/19:  60*     OT SHORT TERM GOAL #4   Title   Pt will report R hand/wrist pain less than or equal to 4/10 for BADLs.    Time  4    Period  Weeks    Status  Achieved   05/28/19:  2/10     OT SHORT TERM GOAL #5   Title  Pt will use RUE as dominant UE for BADLs at least 50% of the time.    Time  4    Period  Weeks    Status  Achieved   05/28/19:  met at approx this level per pt report       OT Long Term Goals - 03/31/19 1039      OT LONG TERM GOAL #1   Title  Pt will be independent with stengthening HEP for R hand/wrist.--check LTGs 05/30/19    Time  8    Period  Weeks    Status  New      OT LONG TERM GOAL #2   Title  Pt will demo at least 35lbs R grip strength for opening containers/picking up objects.    Baseline  13lbs    Time  8    Period  Weeks    Status  New      OT LONG TERM GOAL #3   Title  Pt will report pain in R hand/wrist less than or equal to 3/10 for light ADLs.    Time  8    Period  Weeks    Status  New      OT LONG TERM GOAL #4   Title  Pt will use RUE as dominant UE for ADLs at least 90% of the time.    Time  8    Period  Weeks    Status  New      OT LONG TERM GOAL #5   Title  Pt will demo at least 5lb improvement in lateral pinch strength and at least 4lb improvement in 3point pinch strength for ADLs.    Baseline  lateral 5.5lbs, 3point 4lbs    Time  8    Period  Weeks    Status  New            Plan - 05/28/19 8413    Clinical Impression Statement  Pt is making good progress towards goals with improving R hand functional use.    OT Occupational Profile and History  Problem Focused Assessment - Including  review of records relating to presenting problem    Occupational performance deficits (Please refer to evaluation for details):  ADL's;IADL's;Rest and Sleep;Leisure;Social Participation    Body Structure / Function / Physical Skills  ADL;Coordination;UE functional use;IADL;Pain;FMC;Strength;ROM;Edema;Sensation    Rehab Potential  Good    Clinical Decision Making  Several treatment  options, min-mod task modification necessary    Comorbidities Affecting Occupational Performance:  May have comorbidities impacting occupational performance    Modification or Assistance to Complete Evaluation   No modification of tasks or assist necessary to complete eval    OT Frequency  2x / week    OT Duration  8 weeks   +eval   OT Treatment/Interventions  Self-care/ADL training;Electrical Stimulation;Therapeutic exercise;Patient/family education;Splinting;Neuromuscular education;Paraffin;Moist Heat;Fluidtherapy;Energy conservation;Therapeutic activities;Passive range of motion;Manual Therapy;DME and/or AE instruction;Contrast Bath;Ultrasound;Cryotherapy    Plan  fludio, A/PROM, gentle strengthening    OT Home Exercise Plan  Education provided:  initial HEP 6/1    Recommended Other Services  seeing ortho PT for R shoulder    Consulted and Agree with Plan of Care  Patient       Patient will benefit from skilled therapeutic intervention in order to improve the following deficits and impairments:   Body Structure / Function / Physical Skills: ADL, Coordination, UE functional use, IADL, Pain, FMC, Strength, ROM, Edema, Sensation       Visit Diagnosis: 1. Muscle weakness (generalized)   2. Pain in right hand   3. Stiffness of right hand, not elsewhere classified   4. Pain in right wrist   5. Stiffness of right wrist, not elsewhere classified   6. Other disturbances of skin sensation       Problem List Patient Active Problem List   Diagnosis Date Noted  . Post-op pain 12/27/2016  . OSA (obstructive sleep apnea) 08/25/2014    [facility] 05/28/2019, 7:48 AM  [facility] [address] [place], [place], [address] Phone: [phone]   Fax:  [phone]  Name: Kimberly Haynes MRN: [id number] Date of Birth: 29-Mar-1961   Vianne Bulls, OTR/L [facility] [address] [place], [place]  [address] [phone] phone [phone] 05/28/19 7:48 AM

## 2019-05-29 ENCOUNTER — Ambulatory Visit: Payer: 59

## 2019-05-30 MED FILL — VYVANSE 60 MG CAPSULE: 60 | 90 days supply | Qty: 90 | Fill #0

## 2019-06-03 ENCOUNTER — Ambulatory Visit: Payer: 59 | Admitting: Occupational Therapy

## 2019-06-03 ENCOUNTER — Ambulatory Visit: Payer: 59 | Attending: Orthopaedic Surgery

## 2019-06-03 ENCOUNTER — Other Ambulatory Visit: Payer: Self-pay

## 2019-06-03 DIAGNOSIS — M25611 Stiffness of right shoulder, not elsewhere classified: Secondary | ICD-10-CM | POA: Diagnosis not present

## 2019-06-03 DIAGNOSIS — R208 Other disturbances of skin sensation: Secondary | ICD-10-CM | POA: Insufficient documentation

## 2019-06-03 DIAGNOSIS — M25631 Stiffness of right wrist, not elsewhere classified: Secondary | ICD-10-CM | POA: Diagnosis not present

## 2019-06-03 DIAGNOSIS — M25531 Pain in right wrist: Secondary | ICD-10-CM

## 2019-06-03 DIAGNOSIS — M25641 Stiffness of right hand, not elsewhere classified: Secondary | ICD-10-CM

## 2019-06-03 DIAGNOSIS — M79641 Pain in right hand: Secondary | ICD-10-CM

## 2019-06-03 DIAGNOSIS — M6281 Muscle weakness (generalized): Secondary | ICD-10-CM | POA: Diagnosis not present

## 2019-06-03 NOTE — Therapy (Signed)
[facility] [address] [place], [place], [address] Phone: [phone]   Fax:  [phone]  Occupational Therapy Treatment  Patient Details  Name: Kimberly Haynes MRN: [id number] Date of Birth: 10/01/1961 Referring Provider (OT): Dr. Ophelia Charter   Encounter Date: 06/03/2019  OT End of Session - 06/03/19 0841    Visit Number  7    Number of Visits  13    Date for OT Re-Evaluation  06/27/19   date extension due to missed visits   Authorization Type  [employer], no visit limit/authorization required    OT Start Time  0805    OT Stop Time  0843    OT Time Calculation (min)  38 min    Activity Tolerance  Patient tolerated treatment well    Behavior During Therapy  [facility] for tasks assessed/performed       Past Medical History:  Diagnosis Date  . Bipolar disorder ([place])   . Depression   . Hearing impairment   . Sleep apnea    uses dental appliance    Past Surgical History:  Procedure Laterality Date  . APPENDECTOMY    . 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 ORIF HUMERUS FRACTURE Right 01/12/2019   Procedure: OPEN REDUCTION INTERNAL FIXATION (ORIF) PROXIMAL HUMERUS FRACTURE;  Surgeon: Hiram Gash, MD;  Location: [facility];  Service: Orthopedics;  Laterality: Right;  . RADIOACTIVE SEED GUIDED EXCISIONAL BREAST BIOPSY Right 11/07/2016   Procedure: RADIOACTIVE SEED GUIDED EXCISIONAL BREAST BIOPSY;  Surgeon: Rolm Bookbinder, MD;  Location: [facility];  Service: General;  Laterality: Right;  . TUBAL LIGATION      There were no vitals filed for this visit.  Subjective Assessment - 06/03/19 0839    Subjective   Pt reports using her hand more    Pertinent History  sleep apnea, bipolar disorder,s/p R humeral fx and ORIF on 01/12/2019 (injured 01/11/19) see MD next 6/9 (arm in  sling x6 weeks); hearing impaired    Limitations  s/p R humeral fx and ORIF on 01/12/2019 (injured 01/11/19) see MD next 6/9 (arm in sling x6 weeks); hearing impaired    Currently in Pain?  Yes    Pain Score  2     Pain Location  Hand   shoulder   Pain Orientation  Right    Pain Descriptors / Indicators  Aching    Pain Type  Chronic pain    Pain Onset  More than a month ago    Aggravating Factors   use    Pain Relieving Factors  heat                Paraffin x 10 mins min to right hand for pain, stiffness, desensitization with no adverse reactions.   AROM  Tendon glides x10 Prayer stretch for composite wrist/finger ext. Wrist flex/ext with 2lb weight x10 each, then supination/ pronation with light resistance 15 reps each, min v.c exercises for  grip and individual finger pinch , finger ext balling up plastic bag then flattening out 5 reps each Gripper set at level 2 to pick up 1 inch blocks min difficulty for incr sustained grip strength, min difficulty. Graded clothespins in standing for sustained pinch and functional reach, min difficulty, v.c to avoid compensation. Discussion with pt regarding avoiding compensation and protective posturing. Pt was encouraged to  use hand for light tasks.               OT Short Term Goals - 05/28/19 0719      OT SHORT TERM GOAL #1   Title  Pt will be independent with HEP for R hand/wrist ROM.--check STGs 04/30/19 (extend through 05/31/19 due to decr frequency)    Time  4    Period  Weeks    Status  Achieved      OT SHORT TERM GOAL #2   Title  Pt will demo at least 90% R finger flex for improved grasp of objects.    Baseline  50%    Time  4    Period  Weeks    Status  Achieved   05/28/19:  after stretching 1-2x     OT SHORT TERM GOAL #3   Title  Pt will demo at least 60* R wrist extension for ADLs.    Baseline  40*    Period  Weeks    Status  Achieved   05/28/19:  60*     OT SHORT TERM GOAL #4   Title  Pt will report R  hand/wrist pain less than or equal to 4/10 for BADLs.    Time  4    Period  Weeks    Status  Achieved   05/28/19:  2/10     OT SHORT TERM GOAL #5   Title  Pt will use RUE as dominant UE for BADLs at least 50% of the time.    Time  4    Period  Weeks    Status  Achieved   05/28/19:  met at approx this level per pt report       OT Long Term Goals - 06/03/19 0902      OT LONG TERM GOAL #1   Title  Pt will be independent with stengthening HEP for R hand/wrist.--check LTGs 06/27/2019    Time  8    Period  Weeks    Status  On-going      OT LONG TERM GOAL #2   Title  Pt will demo at least 35lbs R grip strength for opening containers/picking up objects.    Baseline  13lbs    Time  8    Period  Weeks    Status  On-going      OT LONG TERM GOAL #3   Title  Pt will report pain in R hand/wrist less than or equal to 3/10 for light ADLs.    Time  8    Period  Weeks    Status  On-going      OT LONG TERM GOAL #4   Title  Pt will use RUE as dominant UE for ADLs at least 90% of the time.    Time  8    Period  Weeks    Status  On-going      OT LONG TERM GOAL #5   Title  Pt will demo at least 5lb improvement in lateral pinch strength and at least 4lb improvement in 3point pinch strength for ADLs.    Baseline  lateral 5.5lbs, 3point 4lbs    Time  8    Period  Weeks    Status  On-going            Plan - 06/03/19 0901    Clinical Impression Statement  Pt is progressing towards goals,She continues to require v.c to avoid guarding RUE.    OT Occupational Profile  and History  Problem Focused Assessment - Including review of records relating to presenting problem    Occupational performance deficits (Please refer to evaluation for details):  ADL's;IADL's;Rest and Sleep;Leisure;Social Participation    Body Structure / Function / Physical Skills  ADL;Coordination;UE functional use;IADL;Pain;FMC;Strength;ROM;Edema;Sensation    Rehab Potential  Good    Clinical Decision Making  Several  treatment options, min-mod task modification necessary    Comorbidities Affecting Occupational Performance:  May have comorbidities impacting occupational performance    Modification or Assistance to Complete Evaluation   No modification of tasks or assist necessary to complete eval    OT Frequency  2x / week    OT Duration  8 weeks   +eval   OT Treatment/Interventions  Self-care/ADL training;Electrical Stimulation;Therapeutic exercise;Patient/family education;Splinting;Neuromuscular education;Paraffin;Moist Heat;Fluidtherapy;Energy conservation;Therapeutic activities;Passive range of motion;Manual Therapy;DME and/or AE instruction;Contrast Bath;Ultrasound;Cryotherapy    Plan  fludio, A/PROM, gentle strengthening    OT Home Exercise Plan  Education provided:  initial HEP 6/1    Recommended Other Services  seeing ortho PT for R shoulder    Consulted and Agree with Plan of Care  Patient       Patient will benefit from skilled therapeutic intervention in order to improve the following deficits and impairments:   Body Structure / Function / Physical Skills: ADL, Coordination, UE functional use, IADL, Pain, FMC, Strength, ROM, Edema, Sensation       Visit Diagnosis: 1. Muscle weakness (generalized)   2. Pain in right hand   3. Stiffness of right hand, not elsewhere classified   4. Pain in right wrist   5. Stiffness of right wrist, not elsewhere classified   6. Other disturbances of skin sensation       Problem List Patient Active Problem List   Diagnosis Date Noted  . Post-op pain 12/27/2016  . OSA (obstructive sleep apnea) 08/25/2014    , 06/03/2019, 9:03 AM Kimberly Haynes, OTR/L [phone] Phone: [phone] 9:09 AM 06/03/19 [facility] [address] [place], [place], [address] Phone: [phone]   Fax:  [phone]  Name: Kimberly Haynes MRN: [id number] Date of Birth: Jan 29, 1961

## 2019-06-03 NOTE — Therapy (Signed)
[facility] [place], [place], [address] Phone: [phone]   Fax:  [phone]  Physical Therapy Treatment  Patient Details  Name: Kimberly Haynes MRN: [id number] Date of Birth: Mar 17, 1961 Referring Provider (PT): Dr Ophelia Charter    Encounter Date: 06/03/2019  PT End of Session - 06/03/19 1038    Visit Number  34    Number of Visits  40    Date for PT Re-Evaluation  06/20/19    Authorization Type  [employer], no VL    PT Start Time  0955    PT Stop Time  1045    PT Time Calculation (min)  50 min    Activity Tolerance  Patient tolerated treatment well       Past Medical History:  Diagnosis Date  . Bipolar disorder ([place])   . Depression   . Hearing impairment   . Sleep apnea    uses dental appliance    Past Surgical History:  Procedure Laterality Date  . APPENDECTOMY    . 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 ORIF HUMERUS FRACTURE Right 01/12/2019   Procedure: OPEN REDUCTION INTERNAL FIXATION (ORIF) PROXIMAL HUMERUS FRACTURE;  Surgeon: Hiram Gash, MD;  Location: [facility];  Service: Orthopedics;  Laterality: Right;  . RADIOACTIVE SEED GUIDED EXCISIONAL BREAST BIOPSY Right 11/07/2016   Procedure: RADIOACTIVE SEED GUIDED EXCISIONAL BREAST BIOPSY;  Surgeon: Rolm Bookbinder, MD;  Location: [facility];  Service: General;  Laterality: Right;  . TUBAL LIGATION      There were no vitals filed for this visit.  Subjective Assessment - 06/03/19 1043    Subjective  I was very sore for 3-4 days post last session . Was fine after the session but pain started in PM and lasted to Saturday.    Currently in Pain?  No/denies                       [facility] Adult PT Treatment/Exercise - 06/03/19 0001      Shoulder Exercises: Supine   Protraction   Strengthening;Right;20 reps    Protraction Weight (lbs)  7      Shoulder Exercises: Prone   Flexion  Right;15 reps    Flexion Weight (lbs)  2    Extension  Right;15 reps    Extension Weight (lbs)  2    Horizontal ABduction 1  15 reps;Right    Horizontal ABduction 1 Weight (lbs)  2    Other Prone Exercises  AAROM   flexion /hor abduction/ ER /flexion    with end range stretch      Shoulder Exercises: ROM/Strengthening   UBE (Upper Arm Bike)  3 min for 3 min back L 3    Rhythmic Stabilization, Supine  30 reps circles 5 # weight      Moist Heat Therapy   Number Minutes Moist Heat  10 Minutes    Moist Heat Location  Shoulder      Manual Therapy   Passive ROM  Right shoulder flexion, abduction, ER emphasis, GH mobs gentle grade 1-2  inferior, distraction, A-P, today done prone               PT Short Term Goals - 02/27/19 1153      PT SHORT TERM GOAL #1   Title  Patient will demonstrate passive  shoulder flexion to 60 degrees     Baseline  96    Time  4    Period  Weeks    Status  Achieved    Target Date  02/20/19      PT SHORT TERM GOAL #2   Title  Patient will begin AAROM to the right shoulder ( per MD guidlines)    Baseline  will go to the MD next week     Time  4    Period  Weeks    Status  Achieved    Target Date  02/20/19      PT SHORT TERM GOAL #3   Title  Patient will demonstrate 30 degrees of passive ROM     Baseline  17 degrees     Time  4    Period  Weeks    Status  Achieved        PT Long Term Goals - 05/21/19 0940      PT LONG TERM GOAL #1   Title  Patient will reach behind her head with right hand to brush her hair     Status  Achieved      PT LONG TERM GOAL #2   Title  Patient will use her right arm to feed herself     Status  Achieved      PT LONG TERM GOAL #3   Title  Patient will tuck her shirt in behind her without increased pain with her right arm     Baseline  able to get to sacrum  but incr pain    Status  On-going      PT  LONG TERM GOAL #4   Title  She will be able to lift 3 pounds to head height with no pain to access upper cabinets    Status  Achieved      PT LONG TERM GOAL #5   Title  She will report able to dress with min modification and perform selfcare normally below shoulder height    Status  Achieved      PT LONG TERM GOAL #6   Title  She will be able to carry 10 pounds with RT arm and place on counter top with no pain    Status  Achieved            Plan - 06/03/19 1039    Clinical Impression Statement  She was sore for 4 days last week so may have been too aggressive but today she was fine. We did essentially the same treatment today and will se iif she gets sore again. If she does at same level will eliminate  manual 1 session to see if this was too aggressive.    PT Treatment/Interventions  ADLs/Self Care Home Management;Electrical Stimulation;Cryotherapy;Canalith Repostioning;Moist Heat;Ultrasound;DME Instruction;Gait training;Functional mobility training;Stair training;Therapeutic activities;Therapeutic exercise;Neuromuscular re-education;Patient/family education;Manual techniques;Passive range of motion;Taping    PT Next Visit Plan  contniue strength and ROM to tolerance,    PT Home Exercise Plan  pendulum; towel squeeze; elbow pronation/supination, self PROM supine, tableslide PROM (sliding body away opposed to sliding arm fwd) added putty exercises and wrist flex/ext, pro/sup    rockwood red band    Consulted and Agree with Plan of Care  Patient       Patient will benefit from skilled therapeutic intervention in order to improve the following deficits and impairments:  Pain, Decreased activity tolerance, Decreased endurance, Decreased strength, Decreased range of motion, Impaired UE functional use, Decreased knowledge of  precautions, Decreased safety awareness  Visit Diagnosis: 1. Muscle weakness (generalized)   2. Stiffness of right shoulder, not elsewhere classified         Problem List Patient Active Problem List   Diagnosis Date Noted  . Post-op pain 12/27/2016  . OSA (obstructive sleep apnea) 08/25/2014    Darrel Hoover  PT 06/03/2019, 10:44 AM  [facility] [address] [place], [place], [address] Phone: [phone]   Fax:  [phone]  Name: Kimberly Haynes MRN: [id number] Date of Birth: Jun 04, 1961

## 2019-06-05 ENCOUNTER — Ambulatory Visit: Payer: 59

## 2019-06-05 ENCOUNTER — Other Ambulatory Visit: Payer: Self-pay

## 2019-06-05 ENCOUNTER — Ambulatory Visit: Payer: 59 | Admitting: Occupational Therapy

## 2019-06-05 DIAGNOSIS — M25611 Stiffness of right shoulder, not elsewhere classified: Secondary | ICD-10-CM | POA: Diagnosis not present

## 2019-06-05 DIAGNOSIS — M25631 Stiffness of right wrist, not elsewhere classified: Secondary | ICD-10-CM

## 2019-06-05 DIAGNOSIS — M6281 Muscle weakness (generalized): Secondary | ICD-10-CM | POA: Diagnosis not present

## 2019-06-05 DIAGNOSIS — M25641 Stiffness of right hand, not elsewhere classified: Secondary | ICD-10-CM

## 2019-06-05 DIAGNOSIS — M25531 Pain in right wrist: Secondary | ICD-10-CM | POA: Diagnosis not present

## 2019-06-05 DIAGNOSIS — M79641 Pain in right hand: Secondary | ICD-10-CM

## 2019-06-05 DIAGNOSIS — R208 Other disturbances of skin sensation: Secondary | ICD-10-CM | POA: Diagnosis not present

## 2019-06-05 NOTE — Therapy (Signed)
[facility] [address] [place], [place], [address] Phone: [phone]   Fax:  [phone]  Occupational Therapy Treatment  Patient Details  Name: Kimberly Haynes MRN: [id number] Date of Birth: [birth year] Referring Provider (OT): Dr. Ophelia Charter   Encounter Date: 06/05/2019  OT End of Session - 06/05/19 0820    Visit Number  8    Number of Visits  13    Date for OT Re-Evaluation  06/27/19   date extension due to missed visits   Authorization Type  [employer], no visit limit/authorization required    OT Start Time  0802    OT Stop Time  [phone]    OT Time Calculation (min)  40 min    Activity Tolerance  Patient tolerated treatment well    Behavior During Therapy  [facility] for tasks assessed/performed       Past Medical History:  Diagnosis Date  . Bipolar disorder ([place])   . Depression   . Hearing impairment   . Sleep apnea    uses dental appliance    Past Surgical History:  Procedure Laterality Date  . APPENDECTOMY    . 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 ORIF HUMERUS FRACTURE Right 01/12/2019   Procedure: OPEN REDUCTION INTERNAL FIXATION (ORIF) PROXIMAL HUMERUS FRACTURE;  Surgeon: Hiram Gash, MD;  Location: [facility];  Service: Orthopedics;  Laterality: Right;  . RADIOACTIVE SEED GUIDED EXCISIONAL BREAST BIOPSY Right 11/07/2016   Procedure: RADIOACTIVE SEED GUIDED EXCISIONAL BREAST BIOPSY;  Surgeon: Rolm Bookbinder, MD;  Location: [facility];  Service: General;  Laterality: Right;  . TUBAL LIGATION      There were no vitals filed for this visit.  Subjective Assessment - 06/05/19 0817    Subjective   Pt reports using her hand more    Pertinent History  sleep apnea, bipolar disorder,s/p R humeral fx and ORIF on 01/12/2019 (injured 01/11/19) see MD next 6/9 (arm in  sling x6 weeks); hearing impaired    Limitations  s/p R humeral fx and ORIF on 01/12/2019 (injured 01/11/19) see MD next 6/9 (arm in sling x6 weeks); hearing impaired    Currently in Pain?  Yes    Pain Score  2     Pain Location  Hand    Pain Orientation  Right    Pain Descriptors / Indicators  Aching    Pain Type  Chronic pain    Pain Onset  More than a month ago    Pain Frequency  Intermittent    Aggravating Factors   use    Pain Relieving Factors  heat                   Treatment: Fluidotherapy x 10 mins min to right hand for pain, stiffness, desensitization with no adverse reactions.   AROM  Tendon glides x10 Wrist flex/ext with 2lb weight x10 each, then supination/ pronation with light resistance 15 reps each, min v.c UE winder with 1 lbs weight 3 reps each direction, min difficulty/ v.c Gripper set at level 2 to pick up 1 inch blocks min difficulty for incr sustained grip strength, min difficulty. Graded clothespins in standing for sustained pinch and functional reach, min difficulty, v.c to avoid compensation. Velcro roller with key grip x 5 reps , as pt reports inability to use can opener  OT Short Term Goals - 05/28/19 0719      OT SHORT TERM GOAL #1   Title  Pt will be independent with HEP for R hand/wrist ROM.--check STGs 04/30/19 (extend through 05/31/19 due to decr frequency)    Time  4    Period  Weeks    Status  Achieved      OT SHORT TERM GOAL #2   Title  Pt will demo at least 90% R finger flex for improved grasp of objects.    Baseline  50%    Time  4    Period  Weeks    Status  Achieved   05/28/19:  after stretching 1-2x     OT SHORT TERM GOAL #3   Title  Pt will demo at least 60* R wrist extension for ADLs.    Baseline  40*    Period  Weeks    Status  Achieved   05/28/19:  60*     OT SHORT TERM GOAL #4   Title  Pt will report R hand/wrist pain less than or equal to 4/10 for BADLs.    Time  4    Period  Weeks    Status  Achieved    05/28/19:  2/10     OT SHORT TERM GOAL #5   Title  Pt will use RUE as dominant UE for BADLs at least 50% of the time.    Time  4    Period  Weeks    Status  Achieved   05/28/19:  met at approx this level per pt report       OT Long Term Goals - 06/03/19 0902      OT LONG TERM GOAL #1   Title  Pt will be independent with stengthening HEP for R hand/wrist.--check LTGs 06/27/2019    Time  8    Period  Weeks    Status  On-going      OT LONG TERM GOAL #2   Title  Pt will demo at least 35lbs R grip strength for opening containers/picking up objects.    Baseline  13lbs    Time  8    Period  Weeks    Status  On-going      OT LONG TERM GOAL #3   Title  Pt will report pain in R hand/wrist less than or equal to 3/10 for light ADLs.    Time  8    Period  Weeks    Status  On-going      OT LONG TERM GOAL #4   Title  Pt will use RUE as dominant UE for ADLs at least 90% of the time.    Time  8    Period  Weeks    Status  On-going      OT LONG TERM GOAL #5   Title  Pt will demo at least 5lb improvement in lateral pinch strength and at least 4lb improvement in 3point pinch strength for ADLs.    Baseline  lateral 5.5lbs, 3point 4lbs    Time  8    Period  Weeks    Status  On-going            Plan - 06/05/19 3976    Clinical Impression Statement  Pt is progressing towards goals,She demonstrates improving strength and ROM.    OT Occupational Profile and History  Problem Focused Assessment - Including review of records relating to presenting problem    Occupational performance deficits (Please  refer to evaluation for details):  ADL's;IADL's;Rest and Sleep;Leisure;Social Participation    Body Structure / Function / Physical Skills  ADL;Coordination;UE functional use;IADL;Pain;FMC;Strength;ROM;Edema;Sensation    Rehab Potential  Good    Clinical Decision Making  Several treatment options, min-mod task modification necessary    Comorbidities Affecting Occupational Performance:  May  have comorbidities impacting occupational performance    Modification or Assistance to Complete Evaluation   No modification of tasks or assist necessary to complete eval    OT Frequency  2x / week    OT Duration  8 weeks   +eval   OT Treatment/Interventions  Self-care/ADL training;Electrical Stimulation;Therapeutic exercise;Patient/family education;Splinting;Neuromuscular education;Paraffin;Moist Heat;Fluidtherapy;Energy conservation;Therapeutic activities;Passive range of motion;Manual Therapy;DME and/or AE instruction;Contrast Bath;Ultrasound;Cryotherapy    Plan  fludio, A/PROM, gentle strengthening    OT Home Exercise Plan  Education provided:  initial HEP 6/1    Recommended Other Services  seeing ortho PT for R shoulder    Consulted and Agree with Plan of Care  Patient       Patient will benefit from skilled therapeutic intervention in order to improve the following deficits and impairments:   Body Structure / Function / Physical Skills: ADL, Coordination, UE functional use, IADL, Pain, FMC, Strength, ROM, Edema, Sensation       Visit Diagnosis: 1. Muscle weakness (generalized)   2. Pain in right hand   3. Stiffness of right hand, not elsewhere classified   4. Pain in right wrist   5. Stiffness of right wrist, not elsewhere classified       Problem List Patient Active Problem List   Diagnosis Date Noted  . Post-op pain 12/27/2016  . OSA (obstructive sleep apnea) 08/25/2014    RINE,KATHRYN 06/05/2019, 10:48 AM  [facility] [address] [place], [place], [address] Phone: [phone]   Fax:  [phone]  Name: Kimberly Haynes MRN: [id number] Date of Birth: [birth year]

## 2019-06-05 NOTE — Therapy (Signed)
[place], [place], [address] Phone: [phone]   Fax:  [phone]  Physical Therapy Treatment  Patient Details  Name: Kimberly Haynes MRN: [id number] Date of Birth: Apr 16, 1961 Referring Provider (PT): Dr Ophelia Charter    Encounter Date: 06/05/2019  PT End of Session - 06/05/19 1001    Visit Number  35    Number of Visits  40    Date for PT Re-Evaluation  06/20/19    Authorization Type  [employer], no VL    PT Start Time  0945    PT Stop Time  1035    PT Time Calculation (min)  50 min    Activity Tolerance  Patient tolerated treatment well    Behavior During Therapy  [facility] for tasks assessed/performed       Past Medical History:  Diagnosis Date  . Bipolar disorder ([place])   . Depression   . Hearing impairment   . Sleep apnea    uses dental appliance    Past Surgical History:  Procedure Laterality Date  . APPENDECTOMY    . 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 ORIF HUMERUS FRACTURE Right 01/12/2019   Procedure: OPEN REDUCTION INTERNAL FIXATION (ORIF) PROXIMAL HUMERUS FRACTURE;  Surgeon: Hiram Gash, MD;  Location: [place];  Service: Orthopedics;  Laterality: Right;  . RADIOACTIVE SEED GUIDED EXCISIONAL BREAST BIOPSY Right 11/07/2016   Procedure: RADIOACTIVE SEED GUIDED EXCISIONAL BREAST BIOPSY;  Surgeon: Rolm Bookbinder, MD;  Location: [facility];  Service: General;  Laterality: Right;  . TUBAL LIGATION      There were no vitals filed for this visit.  Subjective Assessment - 06/05/19 1001    Subjective  Mild soreness but i'm OK.         [facility] PT Assessment - 06/05/19 0001      PROM   Right Shoulder ABduction  145 Degrees    Right Shoulder External Rotation  75 Degrees                   [facility] Adult PT Treatment/Exercise - 06/05/19  0001      Shoulder Exercises: Standing   Flexion  Right;Left;15 reps    ABduction  Right;Left;15 reps    Shoulder ABduction Weight (lbs)  3    Extension  Right;Left;20 reps    Extension Weight (lbs)  3      Shoulder Exercises: ROM/Strengthening   UBE (Upper Arm Bike)  3 min for 3 min back L 3      Manual Therapy   Joint Mobilization  Grade 4 and III AP and inferior glides to improve ER/flexion;      Soft tissue mobilization  anterior  shoulder    Passive ROM  Right shoulder flexion, abduction, ER emphasis, GH mobs gentle grade 1-2  inferior, distraction   heavier stretching sitting with ER and overhead reach      HMP at end 10 min         PT Short Term Goals - 02/27/19 1153      PT SHORT TERM GOAL #1   Title  Patient will demonstrate passive shoulder flexion to 60 degrees     Baseline  96    Time  4    Period  Weeks    Status  Achieved    Target Date  02/20/19  PT SHORT TERM GOAL #2   Title  Patient will begin AAROM to the right shoulder ( per MD guidlines)    Baseline  will go to the MD next week     Time  4    Period  Weeks    Status  Achieved    Target Date  02/20/19      PT SHORT TERM GOAL #3   Title  Patient will demonstrate 30 degrees of passive ROM     Baseline  17 degrees     Time  4    Period  Weeks    Status  Achieved        PT Long Term Goals - 06/05/19 1032      PT LONG TERM GOAL #1   Title  Patient will reach behind her head with right hand to brush her hair     Status  Achieved      PT LONG TERM GOAL #2   Title  Patient will use her right arm to feed herself     Status  Achieved      PT LONG TERM GOAL #3   Title  Patient will tuck her shirt in behind her without increased pain with her right arm     Baseline  able to get to sacrum  but incr pain    Status  On-going      PT LONG TERM GOAL #4   Title  She will be able to lift 3 pounds to head height with no pain to access upper cabinets    Baseline  able to do this but range and  movement lessthan LT    Status  Achieved      PT LONG TERM GOAL #5   Title  She will report able to dress with min modification and perform selfcare normally below shoulder height    Status  Achieved      Additional Long Term Goals   Additional Long Term Goals  Yes      PT LONG TERM GOAL #6   Title  She will be able to carry 20 pounds with RT arm and place on counter top with no pain    Status  Revised      PT LONG TERM GOAL #7   Title  Active ROM for flexion will be 160 degrees to make cabinet access  and self care easier    Time  4    Period  Weeks    Status  New      PT LONG TERM GOAL #8   Title  She will have ER    Baseline  80 degree active to eae selfcare and home tasks activity    Time  4    Period  Weeks    Status  New            Plan - 06/05/19 1030    Clinical Impression Statement  ROM appears improved ER/ abduction. Anterior chest still tight in soft tissue   . She reports mild incr soreness and wants to  push for as much ROM as she can get.    PT Treatment/Interventions  ADLs/Self Care Home Management;Electrical Stimulation;Cryotherapy;Canalith Repostioning;Moist Heat;Ultrasound;DME Instruction;Gait training;Functional mobility training;Stair training;Therapeutic activities;Therapeutic exercise;Neuromuscular re-education;Patient/family education;Manual techniques;Passive range of motion;Taping    PT Next Visit Plan  contniue strength and ROM to tolerance,    PT Home Exercise Plan  pendulum; towel squeeze; elbow pronation/supination, self PROM supine, tableslide PROM (sliding body away opposed to  sliding arm fwd) added putty exercises and wrist flex/ext, pro/sup    rockwood red band    Consulted and Agree with Plan of Care  Patient       Patient will benefit from skilled therapeutic intervention in order to improve the following deficits and impairments:  Pain, Decreased activity tolerance, Decreased endurance, Decreased strength, Decreased range of motion,  Impaired UE functional use, Decreased knowledge of precautions, Decreased safety awareness  Visit Diagnosis: 1. Muscle weakness (generalized)   2. Stiffness of right shoulder, not elsewhere classified        Problem List Patient Active Problem List   Diagnosis Date Noted  . Post-op pain 12/27/2016  . OSA (obstructive sleep apnea) 08/25/2014    Darrel Hoover  PT 06/05/2019, 10:35 AM  [facility] [address] [place], [place], [address] Phone: [phone]   Fax:  [phone]  Name: Sterling Ucci MRN: [id number] Date of Birth: 1961/08/07

## 2019-06-10 ENCOUNTER — Encounter: Payer: Self-pay | Admitting: Occupational Therapy

## 2019-06-10 ENCOUNTER — Ambulatory Visit: Payer: 59

## 2019-06-10 ENCOUNTER — Ambulatory Visit: Payer: 59 | Admitting: Occupational Therapy

## 2019-06-10 ENCOUNTER — Other Ambulatory Visit: Payer: Self-pay

## 2019-06-10 DIAGNOSIS — M25611 Stiffness of right shoulder, not elsewhere classified: Secondary | ICD-10-CM | POA: Diagnosis not present

## 2019-06-10 DIAGNOSIS — M6281 Muscle weakness (generalized): Secondary | ICD-10-CM

## 2019-06-10 DIAGNOSIS — M79641 Pain in right hand: Secondary | ICD-10-CM | POA: Diagnosis not present

## 2019-06-10 DIAGNOSIS — M25631 Stiffness of right wrist, not elsewhere classified: Secondary | ICD-10-CM | POA: Diagnosis not present

## 2019-06-10 DIAGNOSIS — R208 Other disturbances of skin sensation: Secondary | ICD-10-CM

## 2019-06-10 DIAGNOSIS — M25531 Pain in right wrist: Secondary | ICD-10-CM | POA: Diagnosis not present

## 2019-06-10 DIAGNOSIS — M25641 Stiffness of right hand, not elsewhere classified: Secondary | ICD-10-CM | POA: Diagnosis not present

## 2019-06-10 NOTE — Therapy (Signed)
[place] [address] [place], [place], [address] Phone: [phone]   Fax:  [phone]  Occupational Therapy Treatment  Patient Details  Name: Kimberly Haynes MRN: [id number] Date of Birth: 09-14-1961 Referring Provider (OT): Dr. Ophelia Charter   Encounter Date: 06/10/2019  OT End of Session - 06/10/19 0758    Visit Number  9    Number of Visits  13    Date for OT Re-Evaluation  06/27/19   date extension due to missed visits   Authorization Type  [employer], no visit limit/authorization required    OT Start Time  0804    OT Stop Time  0842    OT Time Calculation (min)  38 min    Activity Tolerance  Patient tolerated treatment well    Behavior During Therapy  [facility] for tasks assessed/performed       Past Medical History:  Diagnosis Date  . Bipolar disorder ([place])   . Depression   . Hearing impairment   . Sleep apnea    uses dental appliance    Past Surgical History:  Procedure Laterality Date  . APPENDECTOMY    . 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 ORIF HUMERUS FRACTURE Right 01/12/2019   Procedure: OPEN REDUCTION INTERNAL FIXATION (ORIF) PROXIMAL HUMERUS FRACTURE;  Surgeon: Hiram Gash, MD;  Location: [place];  Service: Orthopedics;  Laterality: Right;  . RADIOACTIVE SEED GUIDED EXCISIONAL BREAST BIOPSY Right 11/07/2016   Procedure: RADIOACTIVE SEED GUIDED EXCISIONAL BREAST BIOPSY;  Surgeon: Rolm Bookbinder, MD;  Location: [place];  Service: General;  Laterality: Right;  . TUBAL LIGATION      There were no vitals filed for this visit.  Subjective Assessment - 06/10/19 0757    Subjective   cut L thumb when slicing onion with knife x1, but has done it successfully multiple times as well.  Difficulty drying back to grip towel.  I try to do everything  with my RUE, but it  may not be very well.    Pertinent History  sleep apnea, bipolar disorder,s/p R humeral fx and ORIF on 01/12/2019 (injured 01/11/19) see MD next 6/9 (arm in sling x6 weeks); hearing impaired    Limitations  s/p R humeral fx and ORIF on 01/12/2019 (injured 01/11/19) see MD next 6/9 (arm in sling x6 weeks); hearing impaired    Currently in Pain?  Yes    Pain Score  2     Pain Location  Hand    Pain Orientation  Right    Pain Descriptors / Indicators  Aching    Pain Type  Chronic pain    Pain Onset  More than a month ago    Pain Frequency  Intermittent    Aggravating Factors   use, stiffness    Pain Relieving Factors  heat        Treatment: Fluidotherapy x 11 mins min to right hand for pain, stiffness, desensitization with no adverse reactions.    AROM  Tendon glides x10  Wrist flex/ext with 2lb weight x15 each, then supination/ pronation with light resistance 15 reps each, min v.c  Gripper set at level 2 to pick up 1 inch blocks min difficulty for incr sustained grip strength, min difficulty.  Red putty exercises for gross grip and individual finger pinch strength, finger ext to flatten putty, MP/intrinsic+  pull, lateral pinch          OT Short Term Goals - 05/28/19 0719      OT SHORT TERM GOAL #1   Title  Pt will be independent with HEP for R hand/wrist ROM.--check STGs 04/30/19 (extend through 05/31/19 due to decr frequency)    Time  4    Period  Weeks    Status  Achieved      OT SHORT TERM GOAL #2   Title  Pt will demo at least 90% R finger flex for improved grasp of objects.    Baseline  50%    Time  4    Period  Weeks    Status  Achieved   05/28/19:  after stretching 1-2x     OT SHORT TERM GOAL #3   Title  Pt will demo at least 60* R wrist extension for ADLs.    Baseline  40*    Period  Weeks    Status  Achieved   05/28/19:  60*     OT SHORT TERM GOAL #4   Title  Pt will report R hand/wrist pain less than or equal to 4/10 for BADLs.    Time  4     Period  Weeks    Status  Achieved   05/28/19:  2/10     OT SHORT TERM GOAL #5   Title  Pt will use RUE as dominant UE for BADLs at least 50% of the time.    Time  4    Period  Weeks    Status  Achieved   05/28/19:  met at approx this level per pt report       OT Long Term Goals - 06/10/19 0820      OT LONG TERM GOAL #1   Title  Pt will be independent with stengthening HEP for R hand/wrist.--check LTGs 06/27/2019    Time  8    Period  Weeks    Status  On-going      OT LONG TERM GOAL #2   Title  Pt will demo at least 35lbs R grip strength for opening containers/picking up objects.    Baseline  13lbs    Time  8    Period  Weeks    Status  On-going   06/10/19:  30.9lbs     OT LONG TERM GOAL #3   Title  Pt will report pain in R hand/wrist less than or equal to 3/10 for light ADLs.    Time  8    Period  Weeks    Status  On-going      OT LONG TERM GOAL #4   Title  Pt will use RUE as dominant UE for ADLs at least 90% of the time.    Time  8    Period  Weeks    Status  On-going      OT LONG TERM GOAL #5   Title  Pt will demo at least 5lb improvement in lateral pinch strength and at least 4lb improvement in 3point pinch strength for ADLs.    Baseline  lateral 5.5lbs, 3point 4lbs    Time  8    Period  Weeks    Status  On-going   06/10/19:  6lbs lateral, 3 point 8lbs           Plan - 06/10/19 0758    Clinical Impression Statement  Pt is progressing towards goals with improving strength and R hand functional use.  OT Occupational Profile and History  Problem Focused Assessment - Including review of records relating to presenting problem    Occupational performance deficits (Please refer to evaluation for details):  ADL's;IADL's;Rest and Sleep;Leisure;Social Participation    Body Structure / Function / Physical Skills  ADL;Coordination;UE functional use;IADL;Pain;FMC;Strength;ROM;Edema;Sensation    Rehab Potential  Good    Clinical Decision Making  Several treatment  options, min-mod task modification necessary    Comorbidities Affecting Occupational Performance:  May have comorbidities impacting occupational performance    Modification or Assistance to Complete Evaluation   No modification of tasks or assist necessary to complete eval    OT Frequency  2x / week    OT Duration  8 weeks   +eval   OT Treatment/Interventions  Self-care/ADL training;Electrical Stimulation;Therapeutic exercise;Patient/family education;Splinting;Neuromuscular education;Paraffin;Moist Heat;Fluidtherapy;Energy conservation;Therapeutic activities;Passive range of motion;Manual Therapy;DME and/or AE instruction;Contrast Bath;Ultrasound;Cryotherapy    Plan  fludio, ROM, continue with strengthening    OT Home Exercise Plan  Education provided:  initial HEP 6/1    Recommended Other Services  seeing ortho PT for R shoulder    Consulted and Agree with Plan of Care  Patient       Patient will benefit from skilled therapeutic intervention in order to improve the following deficits and impairments:   Body Structure / Function / Physical Skills: ADL, Coordination, UE functional use, IADL, Pain, FMC, Strength, ROM, Edema, Sensation       Visit Diagnosis: 1. Muscle weakness (generalized)   2. Pain in right hand   3. Stiffness of right hand, not elsewhere classified   4. Pain in right wrist   5. Stiffness of right wrist, not elsewhere classified   6. Other disturbances of skin sensation       Problem List Patient Active Problem List   Diagnosis Date Noted  . Post-op pain 12/27/2016  . OSA (obstructive sleep apnea) 08/25/2014    [facility] 06/10/2019, 8:34 AM  [place] [address] [place], [place], [address] Phone: [phone]   Fax:  [phone]  Name: Kimberly Haynes MRN: [id number] Date of Birth: Jul 22, 1961   Vianne Bulls, OTR/L [facility] [address] [place], [place]  [address] [phone] phone [phone] 06/10/19 8:34 AM

## 2019-06-10 NOTE — Therapy (Signed)
[place], [place], [address] Phone: [phone]   Fax:  [phone]  Physical Therapy Treatment  Patient Details  Name: Kimberly Haynes MRN: [id number] Date of Birth: 05-May-1961 Referring Provider (PT): Dr Ophelia Charter    Encounter Date: 06/10/2019  PT End of Session - 06/10/19 0918    Visit Number  36    Number of Visits  40    Date for PT Re-Evaluation  06/20/19    Authorization Type  [employer], no VL    PT Start Time  0915    PT Stop Time  1010    PT Time Calculation (min)  55 min    Activity Tolerance  Patient tolerated treatment well    Behavior During Therapy  [facility] for tasks assessed/performed       Past Medical History:  Diagnosis Date  . Bipolar disorder ([place])   . Depression   . Hearing impairment   . Sleep apnea    uses dental appliance    Past Surgical History:  Procedure Laterality Date  . APPENDECTOMY    . COCHLEAR IMPLANT Left 12/27/2016   Procedure: COCHLEAR IMPLANT LEFT EAR;  Surgeon: Vicie Mutters, MD;  Location: [place];  Service: ENT;  Laterality: Left;  . ORIF HUMERUS FRACTURE Right 01/12/2019   Procedure: OPEN REDUCTION INTERNAL FIXATION (ORIF) PROXIMAL HUMERUS FRACTURE;  Surgeon: Hiram Gash, MD;  Location: [place];  Service: Orthopedics;  Laterality: Right;  . RADIOACTIVE SEED GUIDED EXCISIONAL BREAST BIOPSY Right 11/07/2016   Procedure: RADIOACTIVE SEED GUIDED EXCISIONAL BREAST BIOPSY;  Surgeon: Rolm Bookbinder, MD;  Location: [place];  Service: General;  Laterality: Right;  . TUBAL LIGATION      There were no vitals filed for this visit.  Subjective Assessment - 06/10/19 0919    Subjective  no pain .    Currently in Pain?  No/denies                       [facility] Adult PT Treatment/Exercise - 06/10/19 0001      Shoulder Exercises: Prone   Flexion  Right;15 reps    Extension  Right;15 reps    Horizontal ABduction 1  15  reps;Right    Other Prone Exercises  row x 15 3#      Shoulder Exercises: Sidelying   External Rotation  Right    External Rotation Weight (lbs)  3#    External Rotation Limitations  30 reps    ABduction  Right    ABduction Weight (lbs)  3#    ABduction Limitations  30 reps      Shoulder Exercises: Standing   Flexion  Right;Left;20 reps    Theraband Level (Shoulder Flexion)  Level 3 (Green)    ABduction  Right;Left;20 reps    Theraband Level (Shoulder ABduction)  Level 3 (Green)    ABduction Limitations  Also 15 reps 3#    Extension  Right;Left;20 reps    Theraband Level (Shoulder Extension)  Level 3 (Green)    Row  20 reps;Right    Theraband Level (Shoulder Row)  Level 3 (Green)    Other Standing Exercises  over head press x 15 5#  then 15 reps 3# RT only      Shoulder Exercises: ROM/Strengthening   UBE (Upper Arm Bike)  L3 4 min for 4 min back    Lat Pull Limitations  20 # x 15 reps  Moist Heat Therapy   Number Minutes Moist Heat  10 Minutes    Moist Heat Location  Shoulder      Manual Therapy   Joint Mobilization  Grade 4 and III AP and inferior glides to improve ER/flexion;      Soft tissue mobilization  to posterior shoulder with decr pain post    Passive ROM  Right shoulder flexion, abduction, ER, GH mobs gentle grade 1-2  inferior, distraction   heavier stretching sitting with ER and overhead reach               PT Short Term Goals - 02/27/19 1153      PT SHORT TERM GOAL #1   Title  Patient will demonstrate passive shoulder flexion to 60 degrees     Baseline  96    Time  4    Period  Weeks    Status  Achieved    Target Date  02/20/19      PT SHORT TERM GOAL #2   Title  Patient will begin AAROM to the right shoulder ( per MD guidlines)    Baseline  will go to the MD next week     Time  4    Period  Weeks    Status  Achieved    Target Date  02/20/19      PT SHORT TERM GOAL #3   Title  Patient will demonstrate 30 degrees of passive ROM      Baseline  17 degrees     Time  4    Period  Weeks    Status  Achieved        PT Long Term Goals - 06/05/19 1032      PT LONG TERM GOAL #1   Title  Patient will reach behind her head with right hand to brush her hair     Status  Achieved      PT LONG TERM GOAL #2   Title  Patient will use her right arm to feed herself     Status  Achieved      PT LONG TERM GOAL #3   Title  Patient will tuck her shirt in behind her without increased pain with her right arm     Baseline  able to get to sacrum  but incr pain    Status  On-going      PT LONG TERM GOAL #4   Title  She will be able to lift 3 pounds to head height with no pain to access upper cabinets    Baseline  able to do this but range and movement lessthan LT    Status  Achieved      PT LONG TERM GOAL #5   Title  She will report able to dress with min modification and perform selfcare normally below shoulder height    Status  Achieved      Additional Long Term Goals   Additional Long Term Goals  Yes      PT LONG TERM GOAL #6   Title  She will be able to carry 20 pounds with RT arm and place on counter top with no pain    Status  Revised      PT LONG TERM GOAL #7   Title  Active ROM for flexion will be 160 degrees to make cabinet access  and self care easier    Time  4    Period  Weeks    Status  New  PT LONG TERM GOAL #8   Title  She will have ER    Baseline  80 degree active to eae selfcare and home tasks activity    Time  4    Period  Weeks    Status  New            Plan - 06/10/19 1030    Clinical Impression Statement  Continue to work on weakness and ROM She was sore from Exercise  in posterior RT shoulder  that was releived with sTW and heat.    PT Treatment/Interventions  ADLs/Self Care Home Management;Electrical Stimulation;Cryotherapy;Canalith Repostioning;Moist Heat;Ultrasound;DME Instruction;Gait training;Functional mobility training;Stair training;Therapeutic activities;Therapeutic  exercise;Neuromuscular re-education;Patient/family education;Manual techniques;Passive range of motion;Taping    PT Next Visit Plan  contniue strength and ROM to tolerance,    PT Home Exercise Plan  pendulum; towel squeeze; elbow pronation/supination, self PROM supine, tableslide PROM (sliding body away opposed to sliding arm fwd) added putty exercises and wrist flex/ext, pro/sup    rockwood red band    Consulted and Agree with Plan of Care  Patient       Patient will benefit from skilled therapeutic intervention in order to improve the following deficits and impairments:  Pain, Decreased activity tolerance, Decreased endurance, Decreased strength, Decreased range of motion, Impaired UE functional use, Decreased knowledge of precautions, Decreased safety awareness  Visit Diagnosis: 1. Muscle weakness (generalized)   2. Stiffness of right shoulder, not elsewhere classified        Problem List Patient Active Problem List   Diagnosis Date Noted  . Post-op pain 12/27/2016  . OSA (obstructive sleep apnea) 08/25/2014    Darrel Hoover  PT 06/10/2019, 10:10 AM  [facility] [address] [place], [place], [address] Phone: [phone]   Fax:  [phone]  Name: Kimberly Haynes MRN: [id number] Date of Birth: Apr 29, 1961

## 2019-06-12 ENCOUNTER — Ambulatory Visit: Payer: 59

## 2019-06-12 ENCOUNTER — Ambulatory Visit: Payer: 59 | Admitting: Occupational Therapy

## 2019-06-12 ENCOUNTER — Other Ambulatory Visit: Payer: Self-pay

## 2019-06-12 DIAGNOSIS — M25611 Stiffness of right shoulder, not elsewhere classified: Secondary | ICD-10-CM

## 2019-06-12 DIAGNOSIS — M6281 Muscle weakness (generalized): Secondary | ICD-10-CM

## 2019-06-12 NOTE — Therapy (Signed)
[place], [place], [address] Phone: [phone]   Fax:  [phone]  Physical Therapy Treatment  Patient Details  Name: Kimberly Haynes MRN: [id number] Date of Birth: Oct 09, 1961 Referring Provider (PT): Dr Ophelia Charter    Encounter Date: 06/12/2019  PT End of Session - 06/12/19 0958    Visit Number  37    Number of Visits  48    Date for PT Re-Evaluation  07/25/19    Authorization Type  [employer], no VL    PT Start Time  1000    PT Stop Time  1050    PT Time Calculation (min)  50 min    Activity Tolerance  Patient tolerated treatment well    Behavior During Therapy  [facility] for tasks assessed/performed       Past Medical History:  Diagnosis Date  . Bipolar disorder ([place])   . Depression   . Hearing impairment   . Sleep apnea    uses dental appliance    Past Surgical History:  Procedure Laterality Date  . APPENDECTOMY    . 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 ORIF HUMERUS FRACTURE Right 01/12/2019   Procedure: OPEN REDUCTION INTERNAL FIXATION (ORIF) PROXIMAL HUMERUS FRACTURE;  Surgeon: Hiram Gash, MD;  Location: [place];  Service: Orthopedics;  Laterality: Right;  . RADIOACTIVE SEED GUIDED EXCISIONAL BREAST BIOPSY Right 11/07/2016   Procedure: RADIOACTIVE SEED GUIDED EXCISIONAL BREAST BIOPSY;  Surgeon: Rolm Bookbinder, MD;  Location: [place];  Service: General;  Laterality: Right;  . TUBAL LIGATION      There were no vitals filed for this visit.  Subjective Assessment - 06/12/19 1001    Subjective  No pain .    Currently in Pain?  No/denies                       [facility] Adult PT Treatment/Exercise - 06/12/19 0001      Shoulder Exercises: Sidelying   External Rotation  Right    External Rotation Weight (lbs)  4#    External  Rotation Limitations  30 reps    ABduction  Right    ABduction Weight (lbs)  4#    ABduction Limitations  30 reps      Shoulder Exercises: Standing   Flexion  Right;Left;20 reps    Theraband Level (Shoulder Flexion)  Level 4 (Blue)    ABduction  Right;Left;20 reps    Theraband Level (Shoulder ABduction)  Level 4 (Blue)    ABduction Limitations  Also 15 reps 3#    Extension  Right;Left;20 reps    Theraband Level (Shoulder Extension)  Level 4 (Blue)    Row  Both;20 reps    Theraband Level (Shoulder Row)  Level 4 (Blue)    Other Standing Exercises  overhead press 3# 2x15      Shoulder Exercises: ROM/Strengthening   UBE (Upper Arm Bike)  L3.2 4 min for 4 min back    Lat Pull Limitations  30 # x 15 reps      Shoulder Exercises: Body Blade   External Rotation  30 seconds      Moist Heat Therapy   Number Minutes Moist Heat  10 Minutes    Moist Heat Location  Shoulder      Manual Therapy   Joint Mobilization  Grade 4 and III AP and inferior glides to improve ER/flexion;      Soft tissue mobilization  to posterior shoulder with decr pain post    Passive ROM  Right shoulder flexion, abduction, ER, GH mobs gentle grade 1-2  inferior, distraction   heavier stretching sitting with ER and overhead reach               PT Short Term Goals - 02/27/19 1153      PT SHORT TERM GOAL #1   Title  Patient will demonstrate passive shoulder flexion to 60 degrees     Baseline  96    Time  4    Period  Weeks    Status  Achieved    Target Date  02/20/19      PT SHORT TERM GOAL #2   Title  Patient will begin AAROM to the right shoulder ( per MD guidlines)    Baseline  will go to the MD next week     Time  4    Period  Weeks    Status  Achieved    Target Date  02/20/19      PT SHORT TERM GOAL #3   Title  Patient will demonstrate 30 degrees of passive ROM     Baseline  17 degrees     Time  4    Period  Weeks    Status  Achieved        PT Long Term Goals - 06/05/19 1032       PT LONG TERM GOAL #1   Title  Patient will reach behind her head with right hand to brush her hair     Status  Achieved      PT LONG TERM GOAL #2   Title  Patient will use her right arm to feed herself     Status  Achieved      PT LONG TERM GOAL #3   Title  Patient will tuck her shirt in behind her without increased pain with her right arm     Baseline  able to get to sacrum  but incr pain    Status  On-going      PT LONG TERM GOAL #4   Title  She will be able to lift 3 pounds to head height with no pain to access upper cabinets    Baseline  able to do this but range and movement lessthan LT    Status  Achieved      PT LONG TERM GOAL #5   Title  She will report able to dress with min modification and perform selfcare normally below shoulder height    Status  Achieved      Additional Long Term Goals   Additional Long Term Goals  Yes      PT LONG TERM GOAL #6   Title  She will be able to carry 20 pounds with RT arm and place on counter top with no pain    Status  Revised      PT LONG TERM GOAL #7   Title  Active ROM for flexion will be 160 degrees to make cabinet access  and self care easier    Time  4    Period  Weeks    Status  New      PT LONG TERM GOAL #8   Title  She will have ER    Baseline  80 degree active to eae selfcare and home tasks  activity    Time  4    Period  Weeks    Status  New            Plan - 06/12/19 4239    Clinical Impression Statement  Reaching behind back improved with stretching from RT hip to LT buttock.    Tolerating exercise without increased pain post session .    i think she should improve ROm and strength so may extend to end of September then discharge    PT Treatment/Interventions  ADLs/Self Care Home Management;Electrical Stimulation;Cryotherapy;Canalith Repostioning;Moist Heat;Ultrasound;DME Instruction;Gait training;Functional mobility training;Stair training;Therapeutic activities;Therapeutic exercise;Neuromuscular  re-education;Patient/family education;Manual techniques;Passive range of motion;Taping    PT Next Visit Plan  contniue strength and ROM to tolerance,   MD renewal next session    PT Home Exercise Plan  pendulum; towel squeeze; elbow pronation/supination, self PROM supine, tableslide PROM (sliding body away opposed to sliding arm fwd) added putty exercises and wrist flex/ext, pro/sup    rockwood red band    Consulted and Agree with Plan of Care  Patient       Patient will benefit from skilled therapeutic intervention in order to improve the following deficits and impairments:  Pain, Decreased activity tolerance, Decreased endurance, Decreased strength, Decreased range of motion, Impaired UE functional use, Decreased knowledge of precautions, Decreased safety awareness  Visit Diagnosis: 1. Stiffness of right shoulder, not elsewhere classified   2. Muscle weakness (generalized)        Problem List Patient Active Problem List   Diagnosis Date Noted  . Post-op pain 12/27/2016  . OSA (obstructive sleep apnea) 08/25/2014    Darrel Hoover  PT 06/12/2019, 10:47 AM  [facility] [address] [place], [place], [address] Phone: [phone]   Fax:  [phone]  Name: Kimberly Haynes MRN: [id number] Date of Birth: 1961/10/05

## 2019-06-17 ENCOUNTER — Other Ambulatory Visit: Payer: Self-pay

## 2019-06-17 ENCOUNTER — Ambulatory Visit: Payer: 59

## 2019-06-17 ENCOUNTER — Ambulatory Visit: Payer: 59 | Admitting: Occupational Therapy

## 2019-06-17 DIAGNOSIS — M25641 Stiffness of right hand, not elsewhere classified: Secondary | ICD-10-CM

## 2019-06-17 DIAGNOSIS — M25631 Stiffness of right wrist, not elsewhere classified: Secondary | ICD-10-CM

## 2019-06-17 DIAGNOSIS — R208 Other disturbances of skin sensation: Secondary | ICD-10-CM | POA: Diagnosis not present

## 2019-06-17 DIAGNOSIS — M25531 Pain in right wrist: Secondary | ICD-10-CM

## 2019-06-17 DIAGNOSIS — M79641 Pain in right hand: Secondary | ICD-10-CM | POA: Diagnosis not present

## 2019-06-17 DIAGNOSIS — M6281 Muscle weakness (generalized): Secondary | ICD-10-CM | POA: Diagnosis not present

## 2019-06-17 DIAGNOSIS — M25611 Stiffness of right shoulder, not elsewhere classified: Secondary | ICD-10-CM | POA: Diagnosis not present

## 2019-06-17 NOTE — Therapy (Signed)
[place], [place], [address] Phone: [phone]   Fax:  [phone]  Physical Therapy Treatment  Patient Details  Name: Kimberly Haynes MRN: [id number] Date of Birth: 15-Feb-1961 Referring Provider (PT): Dr Ophelia Charter    Encounter Date: 06/17/2019  PT End of Session - 06/17/19 0925    Visit Number  38    Number of Visits  48    Date for PT Re-Evaluation  07/25/19    Authorization Type  [employer], no VL    PT Start Time  0915    PT Stop Time  1010    PT Time Calculation (min)  55 min    Activity Tolerance  Patient tolerated treatment well    Behavior During Therapy  [facility] for tasks assessed/performed       Past Medical History:  Diagnosis Date  . Bipolar disorder ([place])   . Depression   . Hearing impairment   . Sleep apnea    uses dental appliance    Past Surgical History:  Procedure Laterality Date  . APPENDECTOMY    . 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 ORIF HUMERUS FRACTURE Right 01/12/2019   Procedure: OPEN REDUCTION INTERNAL FIXATION (ORIF) PROXIMAL HUMERUS FRACTURE;  Surgeon: Hiram Gash, MD;  Location: [place];  Service: Orthopedics;  Laterality: Right;  . RADIOACTIVE SEED GUIDED EXCISIONAL BREAST BIOPSY Right 11/07/2016   Procedure: RADIOACTIVE SEED GUIDED EXCISIONAL BREAST BIOPSY;  Surgeon: Rolm Bookbinder, MD;  Location: [place];  Service: General;  Laterality: Right;  . TUBAL LIGATION      There were no vitals filed for this visit.  Subjective Assessment - 06/17/19 0923    Subjective  No pain .  Doing hand therapy shows how connected arm parts are. Can feel my shoulder work with hand OT.    Currently in Pain?  No/denies         [facility] PT Assessment - 06/17/19 0001      AROM   Right Shoulder Extension  45 Degrees    Right  Shoulder Flexion  117 Degrees    Right Shoulder ABduction  125 Degrees    Right Shoulder Internal Rotation  40 Degrees    Right Shoulder External Rotation  55 Degrees    Right Shoulder Horizontal ABduction  10 Degrees    Right Shoulder Horizontal  ADduction  95 Degrees      PROM   Right Shoulder ABduction  145 Degrees   sitting   Right Shoulder External Rotation  65 Degrees   sitting                  [facility] Adult PT Treatment/Exercise - 06/17/19 0001      Shoulder Exercises: Prone   Flexion  Right    Flexion Limitations  2x10 with end range stretch by PT    Extension  Right;10 reps    Extension Limitations  2x10 with PT end range stretch    External Rotation  Right;10 reps    External Rotation Limitations  2x10 with hor aduction with PT end range stretch    Horizontal ABduction 1  Right;10 reps    Horizontal ABduction 1 Limitations  2x10 with end range stretch       Shoulder Exercises: Standing   Flexion  Right;Left;20 reps    Theraband Level (Shoulder  Flexion)  Level 4 (Blue)    ABduction  Right;Left;20 reps    Theraband Level (Shoulder ABduction)  Level 4 (Blue)    ABduction Limitations  Also 20 reps 3#    Extension  Right;Left;20 reps    Theraband Level (Shoulder Extension)  Level 4 (Blue)    Row  Both;20 reps    Theraband Level (Shoulder Row)  Level 4 (Blue)    Other Standing Exercises  overhead press 3# 2x15      Shoulder Exercises: ROM/Strengthening   UBE (Upper Arm Bike)  L3.2 4 min for 4 min back    Lat Pull Limitations  35 # x 15 reps      Moist Heat Therapy   Number Minutes Moist Heat  10 Minutes    Moist Heat Location  Shoulder      Manual Therapy   Joint Mobilization  Grade 4 and III AP and inferior glides to improve ER/flexion;      Passive ROM  Right shoulder flexion, abduction, ER, GH mobs gentle grade 1-2  inferior, distraction   heavier stretching sitting with ER and overhead reach               PT Short Term Goals - 02/27/19  1153      PT SHORT TERM GOAL #1   Title  Patient will demonstrate passive shoulder flexion to 60 degrees     Baseline  96    Time  4    Period  Weeks    Status  Achieved    Target Date  02/20/19      PT SHORT TERM GOAL #2   Title  Patient will begin AAROM to the right shoulder ( per MD guidlines)    Baseline  will go to the MD next week     Time  4    Period  Weeks    Status  Achieved    Target Date  02/20/19      PT SHORT TERM GOAL #3   Title  Patient will demonstrate 30 degrees of passive ROM     Baseline  17 degrees     Time  4    Period  Weeks    Status  Achieved        PT Long Term Goals - 06/17/19 0865      PT LONG TERM GOAL #1   Title  Patient will reach behind her head with right hand to brush her hair     Status  Achieved      PT LONG TERM GOAL #2   Title  Patient will use her right arm to feed herself     Status  Achieved      PT LONG TERM GOAL #3   Title  Patient will tuck her shirt in behind her without increased pain with her right arm     Baseline  able to get to lower back   but incr pain    Status  On-going      PT LONG TERM GOAL #4   Title  She will be able to lift 5 pounds to head height with no pain to access upper cabinets    Baseline  able to do this but range and movement lessthan LT    Status  Revised      PT LONG TERM GOAL #5   Title  She will report able to dress with min modification and perform selfcare normally below shoulder height    Status  Achieved      PT LONG TERM GOAL #6   Title  She will be able to carry 20 pounds with RT arm and place on counter top with no pain    Status  On-going      PT LONG TERM GOAL #7   Title  Active ROM for flexion will be 160 degrees to make cabinet access  and self care easier    Status  On-going      PT LONG TERM GOAL #8   Title  She will have ER to  80 degrees to ease self care and home tasks    Status  On-going            Plan - 06/17/19 0926    Clinical Impression Statement  Some  ROM improved and progressing strength. She will benefit from PT for another month Wil discharge end of September.    PT Treatment/Interventions  ADLs/Self Care Home Management;Electrical Stimulation;Cryotherapy;Canalith Repostioning;Moist Heat;Ultrasound;DME Instruction;Gait training;Functional mobility training;Stair training;Therapeutic activities;Therapeutic exercise;Neuromuscular re-education;Patient/family education;Manual techniques;Passive range of motion;Taping    PT Next Visit Plan  contniue strength and ROM to tolerance,   MD renewal next session    PT Home Exercise Plan  pendulum; towel squeeze; elbow pronation/supination, self PROM supine, tableslide PROM (sliding body away opposed to sliding arm fwd) added putty exercises and wrist flex/ext, pro/sup    rockwood red band    Consulted and Agree with Plan of Care  Patient       Patient will benefit from skilled therapeutic intervention in order to improve the following deficits and impairments:  Pain, Decreased activity tolerance, Decreased endurance, Decreased strength, Decreased range of motion, Impaired UE functional use, Decreased knowledge of precautions, Decreased safety awareness  Visit Diagnosis: 1. Muscle weakness (generalized)   2. Stiffness of right shoulder, not elsewhere classified        Problem List Patient Active Problem List   Diagnosis Date Noted  . Post-op pain 12/27/2016  . OSA (obstructive sleep apnea) 08/25/2014    Darrel Hoover   PT 06/17/2019, 10:01 AM  [facility] [address] [place], [place], [address] Phone: [phone]   Fax:  [phone]  Name: Kimberly Haynes MRN: [id number] Date of Birth: 07/13/1961

## 2019-06-17 NOTE — Therapy (Signed)
[place] [address] [place], [place], [address] Phone: [phone]   Fax:  [phone]  Occupational Therapy Treatment  Patient Details  Name: Kimberly Haynes MRN: [id number] Date of Birth: 04-29-1961 Referring Provider (OT): Dr. Ophelia Charter   Encounter Date: 06/17/2019  OT End of Session - 06/17/19 0813    Visit Number  10    Number of Visits  13    Date for OT Re-Evaluation  06/27/19   date extension due to missed visits   Authorization Type  [employer], no visit limit/authorization required    OT Start Time  0805    OT Stop Time  0845    OT Time Calculation (min)  40 min    Activity Tolerance  Patient tolerated treatment well    Behavior During Therapy  [facility] for tasks assessed/performed       Past Medical History:  Diagnosis Date  . Bipolar disorder ([place])   . Depression   . Hearing impairment   . Sleep apnea    uses dental appliance    Past Surgical History:  Procedure Laterality Date  . APPENDECTOMY    . 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 ORIF HUMERUS FRACTURE Right 01/12/2019   Procedure: OPEN REDUCTION INTERNAL FIXATION (ORIF) PROXIMAL HUMERUS FRACTURE;  Surgeon: Hiram Gash, MD;  Location: [place];  Service: Orthopedics;  Laterality: Right;  . RADIOACTIVE SEED GUIDED EXCISIONAL BREAST BIOPSY Right 11/07/2016   Procedure: RADIOACTIVE SEED GUIDED EXCISIONAL BREAST BIOPSY;  Surgeon: Rolm Bookbinder, MD;  Location: [place];  Service: General;  Laterality: Right;  . TUBAL LIGATION      There were no vitals filed for this visit.  Subjective Assessment - 06/17/19 0812    Subjective   Pt reports mild pain    Pertinent History  sleep apnea, bipolar disorder,s/p R humeral fx and ORIF on 01/12/2019 (injured 01/11/19) see MD next 6/9 (arm in sling x6  weeks); hearing impaired    Limitations  s/p R humeral fx and ORIF on 01/12/2019 (injured 01/11/19) see MD next 6/9 (arm in sling x6 weeks); hearing impaired    Currently in Pain?  Yes    Pain Score  2     Pain Location  Hand   shoulder   Pain Orientation  Right    Pain Descriptors / Indicators  Aching    Pain Type  Chronic pain    Pain Onset  More than a month ago    Pain Frequency  Intermittent    Aggravating Factors   overuse    Pain Relieving Factors  heat    Effect of Pain on Daily Activities  limits daily activities              Treatment: Fluidotherapy x 9 mins min to right hand for pain, stiffness, desensitization with no adverse reactions.    AROM  Tendon glides x10  Wrist flex/ext with 2lb weight x15 each, then supination/ pronation with light resistance 15 reps each, min v.c  Gripper set at level 2 to pick up 1 inch blocks min difficulty for incr sustained grip strength, min difficulty.  Red putty exercises for gross grip and individual finger pinch strength, finger ext to flatten putty, MP/intrinsic+ pull, lateral pinch  Graded clothespins for sustained pinch, and functional reach in standing, min v.c  OT Short Term Goals - 05/28/19 0719      OT SHORT TERM GOAL #1   Title  Pt will be independent with HEP for R hand/wrist ROM.--check STGs 04/30/19 (extend through 05/31/19 due to decr frequency)    Time  4    Period  Weeks    Status  Achieved      OT SHORT TERM GOAL #2   Title  Pt will demo at least 90% R finger flex for improved grasp of objects.    Baseline  50%    Time  4    Period  Weeks    Status  Achieved   05/28/19:  after stretching 1-2x     OT SHORT TERM GOAL #3   Title  Pt will demo at least 60* R wrist extension for ADLs.    Baseline  40*    Period  Weeks    Status  Achieved   05/28/19:  60*     OT SHORT TERM GOAL #4   Title  Pt will report R hand/wrist pain less than or equal to 4/10 for BADLs.    Time  4     Period  Weeks    Status  Achieved   05/28/19:  2/10     OT SHORT TERM GOAL #5   Title  Pt will use RUE as dominant UE for BADLs at least 50% of the time.    Time  4    Period  Weeks    Status  Achieved   05/28/19:  met at approx this level per pt report       OT Long Term Goals - 06/10/19 0820      OT LONG TERM GOAL #1   Title  Pt will be independent with stengthening HEP for R hand/wrist.--check LTGs 06/27/2019    Time  8    Period  Weeks    Status  On-going      OT LONG TERM GOAL #2   Title  Pt will demo at least 35lbs R grip strength for opening containers/picking up objects.    Baseline  13lbs    Time  8    Period  Weeks    Status  On-going   06/10/19:  30.9lbs     OT LONG TERM GOAL #3   Title  Pt will report pain in R hand/wrist less than or equal to 3/10 for light ADLs.    Time  8    Period  Weeks    Status  On-going      OT LONG TERM GOAL #4   Title  Pt will use RUE as dominant UE for ADLs at least 90% of the time.    Time  8    Period  Weeks    Status  On-going      OT LONG TERM GOAL #5   Title  Pt will demo at least 5lb improvement in lateral pinch strength and at least 4lb improvement in 3point pinch strength for ADLs.    Baseline  lateral 5.5lbs, 3point 4lbs    Time  8    Period  Weeks    Status  On-going   06/10/19:  6lbs lateral, 3 point 8lbs             Patient will benefit from skilled therapeutic intervention in order to improve the following deficits and impairments:           Visit Diagnosis: 1. Muscle weakness (generalized)   2.  Pain in right hand   3. Stiffness of right hand, not elsewhere classified   4. Pain in right wrist   5. Stiffness of right wrist, not elsewhere classified       Problem List Patient Active Problem List   Diagnosis Date Noted  . Post-op pain 12/27/2016  . OSA (obstructive sleep apnea) 08/25/2014    RINE,KATHRYN 06/17/2019, 8:29 AM  [place] [address] [place], [place], [address] Phone: [phone]   Fax:  [phone]  Name: Akayla Brass MRN: [id number] Date of Birth: Mar 25, 1961

## 2019-06-18 DIAGNOSIS — H524 Presbyopia: Secondary | ICD-10-CM | POA: Diagnosis not present

## 2019-06-18 DIAGNOSIS — H5213 Myopia, bilateral: Secondary | ICD-10-CM | POA: Diagnosis not present

## 2019-06-18 DIAGNOSIS — H52221 Regular astigmatism, right eye: Secondary | ICD-10-CM | POA: Diagnosis not present

## 2019-06-19 ENCOUNTER — Other Ambulatory Visit: Payer: Self-pay

## 2019-06-19 ENCOUNTER — Ambulatory Visit: Payer: 59

## 2019-06-19 ENCOUNTER — Ambulatory Visit: Payer: 59 | Admitting: Occupational Therapy

## 2019-06-19 DIAGNOSIS — M25531 Pain in right wrist: Secondary | ICD-10-CM

## 2019-06-19 DIAGNOSIS — M25641 Stiffness of right hand, not elsewhere classified: Secondary | ICD-10-CM

## 2019-06-19 DIAGNOSIS — M25611 Stiffness of right shoulder, not elsewhere classified: Secondary | ICD-10-CM | POA: Diagnosis not present

## 2019-06-19 DIAGNOSIS — R208 Other disturbances of skin sensation: Secondary | ICD-10-CM | POA: Diagnosis not present

## 2019-06-19 DIAGNOSIS — M79641 Pain in right hand: Secondary | ICD-10-CM | POA: Diagnosis not present

## 2019-06-19 DIAGNOSIS — M6281 Muscle weakness (generalized): Secondary | ICD-10-CM

## 2019-06-19 DIAGNOSIS — M25631 Stiffness of right wrist, not elsewhere classified: Secondary | ICD-10-CM | POA: Diagnosis not present

## 2019-06-19 MED FILL — ZALEPLON 10 MG CAPS: 10 | 90 days supply | Qty: 180 | Fill #0

## 2019-06-19 NOTE — Therapy (Signed)
[facility] [address] [place], [place], [address] Phone: [phone]   Fax:  [phone]  Occupational Therapy Treatment  Patient Details  Name: Kimberly Haynes MRN: [id number] Date of Birth: [birth year] Referring Provider (OT): Dr. Ophelia Charter   Encounter Date: 06/19/2019  OT End of Session - 06/19/19 1114    Visit Number  11    Number of Visits  13    Date for OT Re-Evaluation  06/27/19   date extension due to missed visits   Authorization Type  [employer], no visit limit/authorization required    OT Start Time  0805    OT Stop Time  0843    OT Time Calculation (min)  38 min    Activity Tolerance  Patient tolerated treatment well    Behavior During Therapy  [facility] for tasks assessed/performed       Past Medical History:  Diagnosis Date  . Bipolar disorder ([place])   . Depression   . Hearing impairment   . Sleep apnea    uses dental appliance    Past Surgical History:  Procedure Laterality Date  . APPENDECTOMY    . 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 ORIF HUMERUS FRACTURE Right 01/12/2019   Procedure: OPEN REDUCTION INTERNAL FIXATION (ORIF) PROXIMAL HUMERUS FRACTURE;  Surgeon: Hiram Gash, MD;  Location: [facility];  Service: Orthopedics;  Laterality: Right;  . RADIOACTIVE SEED GUIDED EXCISIONAL BREAST BIOPSY Right 11/07/2016   Procedure: RADIOACTIVE SEED GUIDED EXCISIONAL BREAST BIOPSY;  Surgeon: Rolm Bookbinder, MD;  Location: [facility];  Service: General;  Laterality: Right;  . TUBAL LIGATION      There were no vitals filed for this visit.  Subjective Assessment - 06/19/19 1113    Subjective   Pt reports mild pain    Pertinent History  sleep apnea, bipolar disorder,s/p R humeral fx and ORIF on 01/12/2019 (injured 01/11/19) see MD next 6/9 (arm in sling x6  weeks); hearing impaired    Limitations  s/p R humeral fx and ORIF on 01/12/2019 (injured 01/11/19) see MD next 6/9 (arm in sling x6 weeks); hearing impaired    Currently in Pain?  Yes    Pain Score  2     Pain Location  Hand    Pain Orientation  Right    Pain Descriptors / Indicators  Aching    Pain Type  Acute pain    Pain Onset  More than a month ago    Pain Frequency  Intermittent    Aggravating Factors   use    Pain Relieving Factors  rest, heat                  Treatment: Fluidotherapy x 9 mins min to right hand for pain, stiffness, desensitization with no adverse reactions.    Prayer position x 5 reps, AROM  Tendon glides x10  Wrist flex/ext 2 sets of 2 reps each with 2lb weight x15 each, then supination/ pronation with light resistance hammer 15 reps each, min v.c  Gripper set at level 2 to pick up 1 inch blocks min difficulty for incr sustained grip strength, min difficulty.  Green  putty exercises for gross grip and individual finger pinch strength, 10-20 reps each  Forearm gym x 8 reps for increased A/ROM  OT Education - 06/19/19 1115    Education Details  upgraded HEP for grip and tip pinch with green putty    Person(s) Educated  Patient    Methods  Explanation;Demonstration;Verbal cues    Comprehension  Verbalized understanding;Returned demonstration       OT Short Term Goals - 05/28/19 0719      OT SHORT TERM GOAL #1   Title  Pt will be independent with HEP for R hand/wrist ROM.--check STGs 04/30/19 (extend through 05/31/19 due to decr frequency)    Time  4    Period  Weeks    Status  Achieved      OT SHORT TERM GOAL #2   Title  Pt will demo at least 90% R finger flex for improved grasp of objects.    Baseline  50%    Time  4    Period  Weeks    Status  Achieved   05/28/19:  after stretching 1-2x     OT SHORT TERM GOAL #3   Title  Pt will demo at least 60* R wrist extension for ADLs.    Baseline  40*    Period   Weeks    Status  Achieved   05/28/19:  60*     OT SHORT TERM GOAL #4   Title  Pt will report R hand/wrist pain less than or equal to 4/10 for BADLs.    Time  4    Period  Weeks    Status  Achieved   05/28/19:  2/10     OT SHORT TERM GOAL #5   Title  Pt will use RUE as dominant UE for BADLs at least 50% of the time.    Time  4    Period  Weeks    Status  Achieved   05/28/19:  met at approx this level per pt report       OT Long Term Goals - 06/10/19 0820      OT LONG TERM GOAL #1   Title  Pt will be independent with stengthening HEP for R hand/wrist.--check LTGs 06/27/2019    Time  8    Period  Weeks    Status  On-going      OT LONG TERM GOAL #2   Title  Pt will demo at least 35lbs R grip strength for opening containers/picking up objects.    Baseline  13lbs    Time  8    Period  Weeks    Status  On-going   06/10/19:  30.9lbs     OT LONG TERM GOAL #3   Title  Pt will report pain in R hand/wrist less than or equal to 3/10 for light ADLs.    Time  8    Period  Weeks    Status  On-going      OT LONG TERM GOAL #4   Title  Pt will use RUE as dominant UE for ADLs at least 90% of the time.    Time  8    Period  Weeks    Status  On-going      OT LONG TERM GOAL #5   Title  Pt will demo at least 5lb improvement in lateral pinch strength and at least 4lb improvement in 3point pinch strength for ADLs.    Baseline  lateral 5.5lbs, 3point 4lbs    Time  8    Period  Weeks    Status  On-going   06/10/19:  6lbs lateral, 3  point 8lbs             Patient will benefit from skilled therapeutic intervention in order to improve the following deficits and impairments:           Visit Diagnosis: Muscle weakness (generalized)  Pain in right hand  Stiffness of right hand, not elsewhere classified  Pain in right wrist  Stiffness of right wrist, not elsewhere classified    Problem List Patient Active Problem List   Diagnosis Date Noted  . Post-op pain 12/27/2016   . OSA (obstructive sleep apnea) 08/25/2014    Kimberly Haynes 06/19/2019, 11:16 AM  [facility] [address] [place], [place], [address] Phone: [phone]   Fax:  [phone]  Name: Kimberly Haynes MRN: [id number] Date of Birth: 12/16/1960

## 2019-06-19 NOTE — Therapy (Signed)
[facility] [place], [place], [address] Phone: [phone]   Fax:  [phone]  Physical Therapy Treatment  Patient Details  Name: Kimberly Haynes MRN: [id number] Date of Birth: 1961/04/30 Referring Provider (PT): Dr Ophelia Charter    Encounter Date: 06/19/2019  PT End of Session - 06/19/19 0929    Visit Number  39    Number of Visits  48    Date for PT Re-Evaluation  07/25/19    Authorization Type  [employer], no VL    PT Start Time  0915    PT Stop Time  1010    PT Time Calculation (min)  55 min    Activity Tolerance  Patient tolerated treatment well    Behavior During Therapy  [facility] for tasks assessed/performed       Past Medical History:  Diagnosis Date  . Bipolar disorder ([place])   . Depression   . Hearing impairment   . Sleep apnea    uses dental appliance    Past Surgical History:  Procedure Laterality Date  . APPENDECTOMY    . 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 ORIF HUMERUS FRACTURE Right 01/12/2019   Procedure: OPEN REDUCTION INTERNAL FIXATION (ORIF) PROXIMAL HUMERUS FRACTURE;  Surgeon: Hiram Gash, MD;  Location: [facility];  Service: Orthopedics;  Laterality: Right;  . RADIOACTIVE SEED GUIDED EXCISIONAL BREAST BIOPSY Right 11/07/2016   Procedure: RADIOACTIVE SEED GUIDED EXCISIONAL BREAST BIOPSY;  Surgeon: Rolm Bookbinder, MD;  Location: [facility];  Service: General;  Laterality: Right;  . TUBAL LIGATION      There were no vitals filed for this visit.  Subjective Assessment - 06/19/19 0928    Subjective  Doing well.    Patient Stated Goals  to have functional use of her right UE     Currently in Pain?  No/denies                       [facility] Adult PT Treatment/Exercise - 06/19/19 0001      Shoulder Exercises: Prone   Flexion  Limitations  2x10 with end range stretch by PT    Extension  Right;15 reps    Extension Weight (lbs)  4    Extension Limitations  2x10 with PT end range stretch    External Rotation  Right    External Rotation Limitations  2x10 with hor aduction with PT end range stretch      Shoulder Exercises: Sidelying   External Rotation  Right    External Rotation Weight (lbs)  4    External Rotation Limitations  20    ABduction  Right    ABduction Weight (lbs)  4    ABduction Limitations  20      Shoulder Exercises: Standing   Flexion  Right;Left;20 reps    Theraband Level (Shoulder Flexion)  Level 4 (Blue)    ABduction  Right;Left;20 reps    Theraband Level (Shoulder ABduction)  Level 4 (Blue)    Extension  Right;Left;20 reps    Theraband Level (Shoulder Extension)  Level 4 (Blue)    Row  Both;20 reps    Theraband Level (Shoulder Row)  Level 4 (Blue)    Other Standing Exercises  overhead press 5# 15,  bicep curls x 20 8#       Shoulder Exercises:  ROM/Strengthening   UBE (Upper Arm Bike)  L3.2 4 min for 4 min back    Lat Pull Limitations  35 # x 20 reps      Moist Heat Therapy   Number Minutes Moist Heat  10 Minutes    Moist Heat Location  Shoulder      Manual Therapy   Joint Mobilization  Grade 4 and III AP and inferior glides to improve ER/flexion;      Passive ROM  Right shoulder flexion, abduction, ER, GH mobs gentle grade 1-2  inferior, distraction   Stretching reach behins back,  ER and IR with hor abduciton . all prone               PT Short Term Goals - 02/27/19 1153      PT SHORT TERM GOAL #1   Title  Patient will demonstrate passive shoulder flexion to 60 degrees     Baseline  96    Time  4    Period  Weeks    Status  Achieved    Target Date  02/20/19      PT SHORT TERM GOAL #2   Title  Patient will begin AAROM to the right shoulder ( per MD guidlines)    Baseline  will go to the MD next week     Time  4    Period  Weeks    Status  Achieved    Target  Date  02/20/19      PT SHORT TERM GOAL #3   Title  Patient will demonstrate 30 degrees of passive ROM     Baseline  17 degrees     Time  4    Period  Weeks    Status  Achieved        PT Long Term Goals - 06/17/19 1751      PT LONG TERM GOAL #1   Title  Patient will reach behind her head with right hand to brush her hair     Status  Achieved      PT LONG TERM GOAL #2   Title  Patient will use her right arm to feed herself     Status  Achieved      PT LONG TERM GOAL #3   Title  Patient will tuck her shirt in behind her without increased pain with her right arm     Baseline  able to get to lower back   but incr pain    Status  On-going      PT LONG TERM GOAL #4   Title  She will be able to lift 5 pounds to head height with no pain to access upper cabinets    Baseline  able to do this but range and movement lessthan LT    Status  Revised      PT LONG TERM GOAL #5   Title  She will report able to dress with min modification and perform selfcare normally below shoulder height    Status  Achieved      PT LONG TERM GOAL #6   Title  She will be able to carry 20 pounds with RT arm and place on counter top with no pain    Status  On-going      PT LONG TERM GOAL #7   Title  Active ROM for flexion will be 160 degrees to make cabinet access  and self care easier    Status  On-going  PT LONG TERM GOAL #8   Title  She will have ER to  80 degrees to ease self care and home tasks    Status  On-going            Plan - 06/19/19 0931    Clinical Impression Statement  Continues to tolerate strenght and ROM .  Will continue to push as tolerated. Add push up wall or counter load body weight as tolerated    PT Treatment/Interventions  ADLs/Self Care Home Management;Electrical Stimulation;Cryotherapy;Canalith Repostioning;Moist Heat;Ultrasound;DME Instruction;Gait training;Functional mobility training;Stair training;Therapeutic activities;Therapeutic exercise;Neuromuscular  re-education;Patient/family education;Manual techniques;Passive range of motion;Taping    PT Next Visit Plan  contniue strength and ROM to tolerance,    PT Home Exercise Plan  pendulum; towel squeeze; elbow pronation/supination, self PROM supine, tableslide PROM (sliding body away opposed to sliding arm fwd) added putty exercises and wrist flex/ext, pro/sup    rockwood red band    Consulted and Agree with Plan of Care  Patient       Patient will benefit from skilled therapeutic intervention in order to improve the following deficits and impairments:  Pain, Decreased activity tolerance, Decreased endurance, Decreased strength, Decreased range of motion, Impaired UE functional use, Decreased knowledge of precautions, Decreased safety awareness  Visit Diagnosis: Muscle weakness (generalized)  Stiffness of right shoulder, not elsewhere classified     Problem List Patient Active Problem List   Diagnosis Date Noted  . Post-op pain 12/27/2016  . OSA (obstructive sleep apnea) 08/25/2014    Darrel Hoover PT 06/19/2019, 10:02 AM  [facility] [address] [place], [place], [address] Phone: [phone]   Fax:  [phone]  Name: Kimberly Haynes MRN: [id number] Date of Birth: [birth year]

## 2019-06-24 ENCOUNTER — Telehealth: Payer: Self-pay | Admitting: Physical Therapy

## 2019-06-24 ENCOUNTER — Ambulatory Visit: Payer: 59 | Admitting: Physical Therapy

## 2019-06-24 ENCOUNTER — Ambulatory Visit: Payer: 59 | Admitting: Occupational Therapy

## 2019-06-24 NOTE — Telephone Encounter (Signed)
LVM regarding missed appointment today and noted when her next scheduled appointment is. If she is unable to attend the next visit to call us and we can cancel or reschedule that visit.

## 2019-06-25 ENCOUNTER — Ambulatory Visit: Payer: 59

## 2019-06-26 ENCOUNTER — Ambulatory Visit: Payer: 59 | Admitting: Physical Therapy

## 2019-06-26 ENCOUNTER — Ambulatory Visit: Payer: 59 | Admitting: Occupational Therapy

## 2019-06-26 ENCOUNTER — Other Ambulatory Visit: Payer: Self-pay

## 2019-06-26 DIAGNOSIS — M25531 Pain in right wrist: Secondary | ICD-10-CM

## 2019-06-26 DIAGNOSIS — M25631 Stiffness of right wrist, not elsewhere classified: Secondary | ICD-10-CM | POA: Diagnosis not present

## 2019-06-26 DIAGNOSIS — M25641 Stiffness of right hand, not elsewhere classified: Secondary | ICD-10-CM | POA: Diagnosis not present

## 2019-06-26 DIAGNOSIS — M79641 Pain in right hand: Secondary | ICD-10-CM

## 2019-06-26 DIAGNOSIS — R208 Other disturbances of skin sensation: Secondary | ICD-10-CM | POA: Diagnosis not present

## 2019-06-26 DIAGNOSIS — M6281 Muscle weakness (generalized): Secondary | ICD-10-CM | POA: Diagnosis not present

## 2019-06-26 DIAGNOSIS — M25611 Stiffness of right shoulder, not elsewhere classified: Secondary | ICD-10-CM

## 2019-06-26 NOTE — Therapy (Signed)
[facility] [place], [place], [address] Phone: [phone]   Fax:  [phone]  Physical Therapy Treatment  Patient Details  Name: Kimberly Haynes MRN: [id number] Date of Birth: 04-01-1961 Referring Provider (PT): Dr Ophelia Charter    Encounter Date: 06/26/2019    Past Medical History:  Diagnosis Date  . Bipolar disorder ([place])   . Depression   . Hearing impairment   . Sleep apnea    uses dental appliance    Past Surgical History:  Procedure Laterality Date  . APPENDECTOMY    . COCHLEAR IMPLANT Left 12/27/2016   Procedure: COCHLEAR IMPLANT LEFT EAR;  Surgeon: Vicie Mutters, MD;  Location: [facility];  Service: ENT;  Laterality: Left;  . ORIF HUMERUS FRACTURE Right 01/12/2019   Procedure: OPEN REDUCTION INTERNAL FIXATION (ORIF) PROXIMAL HUMERUS FRACTURE;  Surgeon: Hiram Gash, MD;  Location: [facility];  Service: Orthopedics;  Laterality: Right;  . RADIOACTIVE SEED GUIDED EXCISIONAL BREAST BIOPSY Right 11/07/2016   Procedure: RADIOACTIVE SEED GUIDED EXCISIONAL BREAST BIOPSY;  Surgeon: Rolm Bookbinder, MD;  Location: [facility];  Service: General;  Laterality: Right;  . TUBAL LIGATION      There were no vitals filed for this visit.                              PT Short Term Goals - 02/27/19 1153      PT SHORT TERM GOAL #1   Title  Patient will demonstrate passive shoulder flexion to 60 degrees     Baseline  96    Time  4    Period  Weeks    Status  Achieved    Target Date  02/20/19      PT SHORT TERM GOAL #2   Title  Patient will begin AAROM to the right shoulder ( per MD guidlines)    Baseline  will go to the MD next week     Time  4    Period  Weeks    Status  Achieved    Target Date  02/20/19      PT SHORT TERM GOAL #3   Title  Patient will demonstrate 30 degrees of passive ROM     Baseline  17 degrees     Time  4    Period  Weeks    Status   Achieved        PT Long Term Goals - 06/17/19 ID:[id number]      PT LONG TERM GOAL #1   Title  Patient will reach behind her head with right hand to brush her hair     Status  Achieved      PT LONG TERM GOAL #2   Title  Patient will use her right arm to feed herself     Status  Achieved      PT LONG TERM GOAL #3   Title  Patient will tuck her shirt in behind her without increased pain with her right arm     Baseline  able to get to lower back   but incr pain    Status  On-going      PT LONG TERM GOAL #4   Title  She will be able to lift 5 pounds to head height with no pain to access upper cabinets    Baseline  able to do this but range and movement lessthan LT  Status  Revised      PT LONG TERM GOAL #5   Title  She will report able to dress with min modification and perform selfcare normally below shoulder height    Status  Achieved      PT LONG TERM GOAL #6   Title  She will be able to carry 20 pounds with RT arm and place on counter top with no pain    Status  On-going      PT LONG TERM GOAL #7   Title  Active ROM for flexion will be 160 degrees to make cabinet access  and self care easier    Status  On-going      PT LONG TERM GOAL #8   Title  She will have ER to  80 degrees to ease self care and home tasks    Status  On-going            Plan - 06/26/19 [id number]    Clinical Impression Statement  when getting pt from waiting area she reported she felt a the beginnings of a migraine coming on and that she need to go home. I asked if she felt safe to drive home and if she would like for [place] to take her vitals and she refused. she stated that she could make it and declined having her vitals assessed at this time.       Patient will benefit from skilled therapeutic intervention in order to improve the following deficits and impairments:     Visit Diagnosis: Stiffness of right shoulder, not elsewhere classified  Pain in right hand  Stiffness of right hand, not elsewhere  classified  Muscle weakness (generalized)     Problem List Patient Active Problem List   Diagnosis Date Noted  . Post-op pain 12/27/2016  . OSA (obstructive sleep apnea) 08/25/2014   Starr Lake PT, DPT, LAT, ATC  06/26/19  9:40 AM      [facility] [address] [place], [place], [address] Phone: [phone]   Fax:  [phone]  Name: Kimberly Haynes MRN: [id number] Date of Birth: 1961/04/02

## 2019-06-26 NOTE — Therapy (Signed)
[place] [address] [place], [place], [address] Phone: [phone]   Fax:  [phone]  Occupational Therapy Treatment  Patient Details  Name: Kimberly Haynes MRN: [id number] Date of Birth: 1961/10/08 Referring Provider (OT): Dr. Ophelia Charter   Encounter Date: 06/26/2019  OT End of Session - 06/26/19 0812    Visit Number  12    Number of Visits  13    Date for OT Re-Evaluation  06/27/19   date extension due to missed visits   Authorization Type  [employer], no visit limit/authorization required    OT Start Time  0802    OT Stop Time  [phone]    OT Time Calculation (min)  40 min    Activity Tolerance  Patient tolerated treatment well    Behavior During Therapy  [facility] for tasks assessed/performed       Past Medical History:  Diagnosis Date  . Bipolar disorder ([place])   . Depression   . Hearing impairment   . Sleep apnea    uses dental appliance    Past Surgical History:  Procedure Laterality Date  . APPENDECTOMY    . 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 ORIF HUMERUS FRACTURE Right 01/12/2019   Procedure: OPEN REDUCTION INTERNAL FIXATION (ORIF) PROXIMAL HUMERUS FRACTURE;  Surgeon: Hiram Gash, MD;  Location: [place];  Service: Orthopedics;  Laterality: Right;  . RADIOACTIVE SEED GUIDED EXCISIONAL BREAST BIOPSY Right 11/07/2016   Procedure: RADIOACTIVE SEED GUIDED EXCISIONAL BREAST BIOPSY;  Surgeon: Rolm Bookbinder, MD;  Location: [place];  Service: General;  Laterality: Right;  . TUBAL LIGATION      There were no vitals filed for this visit.  Subjective Assessment - 06/26/19 0814    Subjective   Denies pain, agress with d/c    Pertinent History  sleep apnea, bipolar disorder,s/p R humeral fx and ORIF on 01/12/2019 (injured 01/11/19) see MD next 6/9 (arm in  sling x6 weeks); hearing impaired    Limitations  s/p R humeral fx and ORIF on 01/12/2019 (injured 01/11/19) see MD next 6/9 (arm in sling x6 weeks); hearing impaired    Currently in Pain?  No/denies    Pain Onset  More than a month ago             Treatment: Fluidotherapy x 10 mins min to right hand for pain, stiffness, desensitization with no adverse reactions.    Prayer position x 5 reps, AROM    Wrist flex/ext 2 sets of 2 reps each with 2lb weight x15 each, then supination/ pronation with light resistance hammer 15 reps each, min v.c  Gripper set at level 2 to pick up 1 inch blocks min difficulty for incr sustained grip strength, good performance  Red  putty exercises for gross grip and individual finger pinch strength, 10-20 reps each(Pt reports that green putty is working fine)  Forearm gym x 8 reps for increased A/ROM                   OT Short Term Goals - 05/28/19 0719      OT SHORT TERM GOAL #1   Title  Pt will be independent with HEP for R hand/wrist ROM.--check STGs 04/30/19 (extend through 05/31/19 due to decr frequency)    Time  4    Period  Weeks  Status  Achieved      OT SHORT TERM GOAL #2   Title  Pt will demo at least 90% R finger flex for improved grasp of objects.    Baseline  50%    Time  4    Period  Weeks    Status  Achieved   05/28/19:  after stretching 1-2x     OT SHORT TERM GOAL #3   Title  Pt will demo at least 60* R wrist extension for ADLs.    Baseline  40*    Period  Weeks    Status  Achieved   05/28/19:  60*     OT SHORT TERM GOAL #4   Title  Pt will report R hand/wrist pain less than or equal to 4/10 for BADLs.    Time  4    Period  Weeks    Status  Achieved   05/28/19:  2/10     OT SHORT TERM GOAL #5   Title  Pt will use RUE as dominant UE for BADLs at least 50% of the time.    Time  4    Period  Weeks    Status  Achieved   05/28/19:  met at approx this level per pt report       OT Long Term Goals - 06/26/19  0813      OT LONG TERM GOAL #1   Title  Pt will be independent with stengthening HEP for R hand/wrist.--check LTGs 06/27/2019    Time  8    Period  Weeks    Status  Achieved      OT LONG TERM GOAL #2   Title  Pt will demo at least 35lbs R grip strength for opening containers/picking up objects.    Baseline  13lbs    Time  8    Period  Weeks    Status  Partially Met   34, 55     OT LONG TERM GOAL #3   Title  Pt will report pain in R hand/wrist less than or equal to 3/10 for light ADLs.    Time  8    Period  Weeks    Status  Achieved      OT LONG TERM GOAL #4   Title  Pt will use RUE as dominant UE for ADLs at least 90% of the time.    Time  8    Period  Weeks    Status  Achieved      OT LONG TERM GOAL #5   Title  Pt will demo at least 5lb improvement in lateral pinch strength and at least 4lb improvement in 3point pinch strength for ADLs.    Baseline  lateral 5.5lbs, 3point 4lbs    Time  8    Period  Weeks    Status  Achieved   13- lateral, 3 pt- 12           Plan - 06/26/19 0832    Clinical Impression Statement  Pt demonstrates good overall progress. She agrees with plans for d/c.    OT Occupational Profile and History  Problem Focused Assessment - Including review of records relating to presenting problem    Occupational performance deficits (Please refer to evaluation for details):  ADL's;IADL's;Rest and Sleep;Leisure;Social Participation    Body Structure / Function / Physical Skills  ADL;Coordination;UE functional use;IADL;Pain;FMC;Strength;ROM;Edema;Sensation    Rehab Potential  Good    Clinical Decision Making  Several treatment options, min-mod task modification  necessary    Comorbidities Affecting Occupational Performance:  May have comorbidities impacting occupational performance    Modification or Assistance to Complete Evaluation   No modification of tasks or assist necessary to complete eval    OT Frequency  2x / week    OT Duration  8 weeks   +eval    OT Treatment/Interventions  Self-care/ADL training;Electrical Stimulation;Therapeutic exercise;Patient/family education;Splinting;Neuromuscular education;Paraffin;Moist Heat;Fluidtherapy;Energy conservation;Therapeutic activities;Passive range of motion;Manual Therapy;DME and/or AE instruction;Contrast Bath;Ultrasound;Cryotherapy    Plan  d/c OT    OT Home Exercise Plan  Education provided:  initial HEP 6/1    Recommended Other Services  seeing ortho PT for R shoulder    Consulted and Agree with Plan of Care  Patient       Patient will benefit from skilled therapeutic intervention in order to improve the following deficits and impairments:   Body Structure / Function / Physical Skills: ADL, Coordination, UE functional use, IADL, Pain, FMC, Strength, ROM, Edema, Sensation       Visit Diagnosis: Stiffness of right shoulder, not elsewhere classified  Pain in right hand  Stiffness of right hand, not elsewhere classified  Muscle weakness (generalized)  Pain in right wrist   OCCUPATIONAL THERAPY DISCHARGE SUMMARY    Current functional level related to goals / functional outcomes: Pt made good overall progress, see goals for progress.   Remaining deficits: Decreased strength   Education / Equipment: Pt was educated regarding HEP. She returned demonstration.  Plan: Patient agrees to discharge.  Patient goals were not met. Patient is being discharged due to meeting the stated rehab goals.  ?????      Problem List Patient Active Problem List   Diagnosis Date Noted  . Post-op pain 12/27/2016  . OSA (obstructive sleep apnea) 08/25/2014    Abeer Iversen 06/26/2019, 8:34 AM Theone Murdoch, OTR/L [phone] Phone: [phone] 8:37 AM 06/26/19 [place] [address] [place], [place], [address] Phone: [phone]   Fax:  [phone]  Name: Nasiya Pascual MRN: [id number] Date of Birth: 1960-12-12

## 2019-06-30 ENCOUNTER — Other Ambulatory Visit: Payer: Self-pay

## 2019-06-30 ENCOUNTER — Ambulatory Visit: Payer: 59

## 2019-06-30 DIAGNOSIS — M25611 Stiffness of right shoulder, not elsewhere classified: Secondary | ICD-10-CM | POA: Diagnosis not present

## 2019-06-30 DIAGNOSIS — R208 Other disturbances of skin sensation: Secondary | ICD-10-CM | POA: Diagnosis not present

## 2019-06-30 DIAGNOSIS — M25531 Pain in right wrist: Secondary | ICD-10-CM | POA: Diagnosis not present

## 2019-06-30 DIAGNOSIS — M25641 Stiffness of right hand, not elsewhere classified: Secondary | ICD-10-CM | POA: Diagnosis not present

## 2019-06-30 DIAGNOSIS — M6281 Muscle weakness (generalized): Secondary | ICD-10-CM

## 2019-06-30 DIAGNOSIS — M25631 Stiffness of right wrist, not elsewhere classified: Secondary | ICD-10-CM | POA: Diagnosis not present

## 2019-06-30 DIAGNOSIS — M79641 Pain in right hand: Secondary | ICD-10-CM | POA: Diagnosis not present

## 2019-06-30 NOTE — Therapy (Signed)
[place], [place], [address] Phone: [phone]   Fax:  [phone]  Physical Therapy Treatment  Patient Details  Name: Kimberly Haynes MRN: [id number] Date of Birth: [birth year] Referring Provider (PT): Dr Ophelia Charter    Encounter Date: 06/30/2019  PT End of Session - 06/30/19 1046    Visit Number  40    Number of Visits  48    Date for PT Re-Evaluation  07/25/19    Authorization Type  [employer], no VL    PT Start Time  1045    PT Stop Time  1136    PT Time Calculation (min)  51 min    Activity Tolerance  Patient tolerated treatment well    Behavior During Therapy  [facility] for tasks assessed/performed       Past Medical History:  Diagnosis Date  . Bipolar disorder ([place])   . Depression   . Hearing impairment   . Sleep apnea    uses dental appliance    Past Surgical History:  Procedure Laterality Date  . APPENDECTOMY    . 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 ORIF HUMERUS FRACTURE Right 01/12/2019   Procedure: OPEN REDUCTION INTERNAL FIXATION (ORIF) PROXIMAL HUMERUS FRACTURE;  Surgeon: Hiram Gash, MD;  Location: [place];  Service: Orthopedics;  Laterality: Right;  . RADIOACTIVE SEED GUIDED EXCISIONAL BREAST BIOPSY Right 11/07/2016   Procedure: RADIOACTIVE SEED GUIDED EXCISIONAL BREAST BIOPSY;  Surgeon: Rolm Bookbinder, MD;  Location: [place];  Service: General;  Laterality: Right;  . TUBAL LIGATION      There were no vitals filed for this visit.  Subjective Assessment - 06/30/19 1044    Subjective  No complaints . no pain at rest.    Currently in Pain?  No/denies         [facility] PT Assessment - 06/30/19 0001      Assessment   Medical Diagnosis  hand stiffness, decr ROM s/p R humeral fx and ORIF     Referring Provider (PT)  Dr Ophelia Charter       AROM   Right Shoulder Flexion  140 Degrees    Right Shoulder ABduction  135 Degrees    Right Shoulder Internal Rotation  45 Degrees    Right Shoulder External Rotation  60 Degrees      PROM   Right Shoulder Flexion  148 Degrees    Right Shoulder ABduction  145 Degrees    Right Shoulder External Rotation  65 Degrees                   [facility] Adult PT Treatment/Exercise - 06/30/19 0001      Shoulder Exercises: Prone   Flexion  Right    Flexion Weight (lbs)  3    Flexion Limitations  3x10    Extension  Right;10 reps    Extension Weight (lbs)  3    Extension Limitations  3 sets    External Rotation  Right;10 reps    External Rotation Weight (lbs)  3    External Rotation Limitations  3 sets    Horizontal ABduction 1  Right;10 reps    Horizontal ABduction 1 Weight (lbs)  4    Horizontal ABduction 1 Limitations  3 sets      Shoulder Exercises: Standing   Other Standing Exercises  ovre head assisted 6#  x 10 then active with end range stretch bduction and flexion.     Other Standing Exercises  push up from table high x10 stopped due to wrist discomfort, then blue band x 20 rockwood.       Shoulder Exercises: ROM/Strengthening   UBE (Upper Arm Bike)  L3.5 4 min for 4 min back    Lat Pull Limitations  40# x 20       Moist Heat Therapy   Number Minutes Moist Heat  8 Minutes    Moist Heat Location  Shoulder      Manual Therapy   Passive ROM  Right shoulder flexion, abduction, ER, GH mobs gentle grade 1-2  inferior, distraction   Stretching reach behins back,  ER and IR with hor abduciton . all prone               PT Short Term Goals - 02/27/19 1153      PT SHORT TERM GOAL #1   Title  Patient will demonstrate passive shoulder flexion to 60 degrees     Baseline  96    Time  4    Period  Weeks    Status  Achieved    Target Date  02/20/19      PT SHORT TERM GOAL #2   Title  Patient will begin AAROM to the right shoulder ( per MD guidlines)    Baseline   will go to the MD next week     Time  4    Period  Weeks    Status  Achieved    Target Date  02/20/19      PT SHORT TERM GOAL #3   Title  Patient will demonstrate 30 degrees of passive ROM     Baseline  17 degrees     Time  4    Period  Weeks    Status  Achieved        PT Long Term Goals - 06/17/19 ID:[id number]      PT LONG TERM GOAL #1   Title  Patient will reach behind her head with right hand to brush her hair     Status  Achieved      PT LONG TERM GOAL #2   Title  Patient will use her right arm to feed herself     Status  Achieved      PT LONG TERM GOAL #3   Title  Patient will tuck her shirt in behind her without increased pain with her right arm     Baseline  able to get to lower back   but incr pain    Status  On-going      PT LONG TERM GOAL #4   Title  She will be able to lift 5 pounds to head height with no pain to access upper cabinets    Baseline  able to do this but range and movement lessthan LT    Status  Revised      PT LONG TERM GOAL #5   Title  She will report able to dress with min modification and perform selfcare normally below shoulder height    Status  Achieved      PT LONG TERM GOAL #6   Title  She will be able to carry 20 pounds with RT arm and place on counter top with no pain    Status  On-going      PT LONG TERM GOAL #7  Title  Active ROM for flexion will be 160 degrees to make cabinet access  and self care easier    Status  On-going      PT LONG TERM GOAL #8   Title  She will have ER to  80 degrees to ease self care and home tasks    Status  On-going            Plan - 06/30/19 1047    Clinical Impression Statement  active standing flexion and abduction 135-140 degrees and 145 passive.   FOTO score exceeded expectation at 37% limited. Will finish month to see if can improve ROm and strength.  Caution with heavy load due to wrist pain and will stop pushups.    Examination-Activity Limitations  Bathing;Hygiene/Grooming;Reach  Overhead;Carry;Dressing;Lift;Caring for Others    PT Treatment/Interventions  ADLs/Self Care Home Management;Electrical Stimulation;Cryotherapy;Canalith Repostioning;Moist Heat;Ultrasound;DME Instruction;Gait training;Functional mobility training;Stair training;Therapeutic activities;Therapeutic exercise;Neuromuscular re-education;Patient/family education;Manual techniques;Passive range of motion;Taping    PT Next Visit Plan  contniue strength and ROM to tolerance,    PT Home Exercise Plan  pendulum; towel squeeze; elbow pronation/supination, self PROM supine, tableslide PROM (sliding body away opposed to sliding arm fwd) added putty exercises and wrist flex/ext, pro/sup    rockwood red band    Consulted and Agree with Plan of Care  Patient       Patient will benefit from skilled therapeutic intervention in order to improve the following deficits and impairments:  Pain, Decreased activity tolerance, Decreased endurance, Decreased strength, Decreased range of motion, Impaired UE functional use, Decreased knowledge of precautions, Decreased safety awareness  Visit Diagnosis: Muscle weakness (generalized)  Stiffness of right shoulder, not elsewhere classified     Problem List Patient Active Problem List   Diagnosis Date Noted  . Post-op pain 12/27/2016  . OSA (obstructive sleep apnea) 08/25/2014    Darrel Hoover PT 06/30/2019, 11:29 AM  [facility] [address] [place], [place], [address] Phone: [phone]   Fax:  [phone]  Name: Bettylee Standrew MRN: [id number] Date of Birth: [birth year]

## 2019-07-01 MED FILL — buPROPion HCL ER (XL) 300 M: 300 | 90 days supply | Qty: 90 | Fill #1

## 2019-07-01 MED FILL — OXcarbazepine 300 MG TABS: 300 | 30 days supply | Qty: 210 | Fill #0

## 2019-07-03 ENCOUNTER — Ambulatory Visit: Payer: 59 | Attending: Orthopaedic Surgery | Admitting: Physical Therapy

## 2019-07-03 ENCOUNTER — Encounter: Payer: Self-pay | Admitting: Physical Therapy

## 2019-07-03 ENCOUNTER — Other Ambulatory Visit: Payer: Self-pay

## 2019-07-03 DIAGNOSIS — M79641 Pain in right hand: Secondary | ICD-10-CM | POA: Diagnosis not present

## 2019-07-03 DIAGNOSIS — M25611 Stiffness of right shoulder, not elsewhere classified: Secondary | ICD-10-CM | POA: Insufficient documentation

## 2019-07-03 DIAGNOSIS — M6281 Muscle weakness (generalized): Secondary | ICD-10-CM | POA: Insufficient documentation

## 2019-07-03 NOTE — Therapy (Signed)
[place], [place], [address] Phone: [phone]   Fax:  [phone]  Physical Therapy Treatment  Patient Details  Name: Kimberly Haynes MRN: [id number] Date of Birth: 1961/06/11 Referring Provider (PT): Dr Ophelia Charter    Encounter Date: 07/03/2019  PT End of Session - 07/03/19 0847    Visit Number  41    Number of Visits  48    Date for PT Re-Evaluation  07/25/19    Authorization Type  [employer], no VL    PT Start Time  0847    PT Stop Time  0928    PT Time Calculation (min)  41 min    Activity Tolerance  Patient tolerated treatment well    Behavior During Therapy  [facility] for tasks assessed/performed       Past Medical History:  Diagnosis Date  . Bipolar disorder ([place])   . Depression   . Hearing impairment   . Sleep apnea    uses dental appliance    Past Surgical History:  Procedure Laterality Date  . APPENDECTOMY    . 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 ORIF HUMERUS FRACTURE Right 01/12/2019   Procedure: OPEN REDUCTION INTERNAL FIXATION (ORIF) PROXIMAL HUMERUS FRACTURE;  Surgeon: Hiram Gash, MD;  Location: [place];  Service: Orthopedics;  Laterality: Right;  . RADIOACTIVE SEED GUIDED EXCISIONAL BREAST BIOPSY Right 11/07/2016   Procedure: RADIOACTIVE SEED GUIDED EXCISIONAL BREAST BIOPSY;  Surgeon: Rolm Bookbinder, MD;  Location: [place];  Service: General;  Laterality: Right;  . TUBAL LIGATION      There were no vitals filed for this visit.  Subjective Assessment - 07/03/19 0847    Subjective  "no pain or issues today"    Currently in Pain?  No/denies         [facility] PT Assessment - 07/03/19 0001      Assessment   Medical Diagnosis  hand stiffness, decr ROM s/p R humeral fx and ORIF     Referring Provider (PT)  Dr Ophelia Charter                     [facility] Adult PT Treatment/Exercise - 07/03/19 0001      Shoulder Exercises: Supine   Other Supine Exercises  pilates cirlce pushing in 2 x 10 with flexion, and pushing out with flexion 2 x 10      Shoulder Exercises: Seated   Other Seated Exercises  pushing in on pilates circle intermittently while turning the ring t    Other Seated Exercises  bicep curl to shoulder press 2 x 4# going to fatigue      Shoulder Exercises: Prone   Flexion  Right;12 reps    Flexion Weight (lbs)  3    Extension  Theraband;10 reps    Theraband Level (Shoulder Extension)  Other (comment)   black     Shoulder Exercises: Standing   Other Standing Exercises  row into tricep extension/kickback 2 x gong to fatigue 4#      Shoulder Exercises: Body Blade   Flexion  30 seconds;2 reps    Other Body Blade Exercises  bicep curl 2 x 30 sec    Other Body Blade Exercises  IR/ER 3 x 30 seconds   tactile cues for form.  PT Short Term Goals - 02/27/19 1153      PT SHORT TERM GOAL #1   Title  Patient will demonstrate passive shoulder flexion to 60 degrees     Baseline  96    Time  4    Period  Weeks    Status  Achieved    Target Date  02/20/19      PT SHORT TERM GOAL #2   Title  Patient will begin AAROM to the right shoulder ( per MD guidlines)    Baseline  will go to the MD next week     Time  4    Period  Weeks    Status  Achieved    Target Date  02/20/19      PT SHORT TERM GOAL #3   Title  Patient will demonstrate 30 degrees of passive ROM     Baseline  17 degrees     Time  4    Period  Weeks    Status  Achieved        PT Long Term Goals - 06/17/19 [id number]      PT LONG TERM GOAL #1   Title  Patient will reach behind her head with right hand to brush her hair     Status  Achieved      PT LONG TERM GOAL #2   Title  Patient will use her right arm to feed herself     Status  Achieved      PT LONG TERM GOAL #3   Title  Patient will tuck her shirt in  behind her without increased pain with her right arm     Baseline  able to get to lower back   but incr pain    Status  On-going      PT LONG TERM GOAL #4   Title  She will be able to lift 5 pounds to head height with no pain to access upper cabinets    Baseline  able to do this but range and movement lessthan LT    Status  Revised      PT LONG TERM GOAL #5   Title  She will report able to dress with min modification and perform selfcare normally below shoulder height    Status  Achieved      PT LONG TERM GOAL #6   Title  She will be able to carry 20 pounds with RT arm and place on counter top with no pain    Status  On-going      PT LONG TERM GOAL #7   Title  Active ROM for flexion will be 160 degrees to make cabinet access  and self care easier    Status  On-going      PT LONG TERM GOAL #8   Title  She will have ER to  80 degrees to ease self care and home tasks    Status  On-going            Plan - 07/03/19 0917    Clinical Impression Statement  pt reports no pain or issues today. continued focus on strengthening of the shoulder in standing/ supine with emphasis on  endurance training to promote functional progress. She did all exercises well reporting no pain or issues.    PT Treatment/Interventions  ADLs/Self Care Home Management;Electrical Stimulation;Cryotherapy;Canalith Repostioning;Moist Heat;Ultrasound;DME Instruction;Gait training;Functional mobility training;Stair training;Therapeutic activities;Therapeutic exercise;Neuromuscular re-education;Patient/family education;Manual techniques;Passive range of motion;Taping    PT Next Visit Plan  contniue  strength and ROM to tolerance,    PT Home Exercise Plan  pendulum; towel squeeze; elbow pronation/supination, self PROM supine, tableslide PROM (sliding body away opposed to sliding arm fwd) added putty exercises and wrist flex/ext, pro/sup    rockwood red band       Patient will benefit from skilled therapeutic  intervention in order to improve the following deficits and impairments:  Pain, Decreased activity tolerance, Decreased endurance, Decreased strength, Decreased range of motion, Impaired UE functional use, Decreased knowledge of precautions, Decreased safety awareness  Visit Diagnosis: No diagnosis found.     Problem List Patient Active Problem List   Diagnosis Date Noted  . Post-op pain 12/27/2016  . OSA (obstructive sleep apnea) 08/25/2014   Starr Lake PT, DPT, LAT, ATC  07/03/19  9:32 AM      [facility] [address] [place], [place], [address] Phone: [phone]   Fax:  [phone]  Name: Kimberly Haynes MRN: [id number] Date of Birth: 08-Jan-1961

## 2019-07-16 ENCOUNTER — Ambulatory Visit: Payer: 59 | Admitting: Physical Therapy

## 2019-07-17 ENCOUNTER — Other Ambulatory Visit: Payer: Self-pay

## 2019-07-17 ENCOUNTER — Ambulatory Visit: Payer: 59

## 2019-07-17 DIAGNOSIS — M6281 Muscle weakness (generalized): Secondary | ICD-10-CM

## 2019-07-17 DIAGNOSIS — M25611 Stiffness of right shoulder, not elsewhere classified: Secondary | ICD-10-CM | POA: Diagnosis not present

## 2019-07-17 DIAGNOSIS — M79641 Pain in right hand: Secondary | ICD-10-CM | POA: Diagnosis not present

## 2019-07-17 NOTE — Addendum Note (Signed)
Addended by: Darrel Hoover on: 07/17/2019 09:26 AM   Modules accepted: Orders

## 2019-07-17 NOTE — Therapy (Addendum)
[place], [place], [address] Phone: [phone]   Fax:  [phone]  Physical Therapy Treatment/Discharge  Patient Details  Name: Kimberly Haynes MRN: [id number] Date of Birth: 1961-01-08 Referring Provider (PT): Dr Ophelia Charter    Encounter Date: 07/17/2019  PT End of Session - 07/17/19 0837    Visit Number  42    Number of Visits  48    Date for PT Re-Evaluation  08/08/19    Authorization Type  [employer], no VL    PT Start Time  0830    PT Stop Time  0915    PT Time Calculation (min)  45 min    Activity Tolerance  Patient tolerated treatment well    Behavior During Therapy  [facility] for tasks assessed/performed       Past Medical History:  Diagnosis Date  . Bipolar disorder ([place])   . Depression   . Hearing impairment   . Sleep apnea    uses dental appliance    Past Surgical History:  Procedure Laterality Date  . APPENDECTOMY    . 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 ORIF HUMERUS FRACTURE Right 01/12/2019   Procedure: OPEN REDUCTION INTERNAL FIXATION (ORIF) PROXIMAL HUMERUS FRACTURE;  Surgeon: Hiram Gash, MD;  Location: [place];  Service: Orthopedics;  Laterality: Right;  . RADIOACTIVE SEED GUIDED EXCISIONAL BREAST BIOPSY Right 11/07/2016   Procedure: RADIOACTIVE SEED GUIDED EXCISIONAL BREAST BIOPSY;  Surgeon: Rolm Bookbinder, MD;  Location: [place];  Service: General;  Laterality: Right;  . TUBAL LIGATION      There were no vitals filed for this visit.  Subjective Assessment - 07/17/19 0837    Subjective  "no pain or issues today"    Currently in Pain?  No/denies                       [facility] Adult PT Treatment/Exercise - 07/17/19 0001      Shoulder Exercises: Seated   Other Seated Exercises  pushing in on pilates circle  intermittently while turning the ring x 10 reps  than compress ring  with flexion x 15    Other Seated Exercises  bicep curl to shoulder press 2 x 4# going to fatigue      Shoulder Exercises: Prone   Other Prone Exercises  hor abduct /adduct x 20  maintaing and stable height until fatigue      Shoulder Exercises: Standing   Flexion  Right;Left;15 reps;20 reps    Shoulder Flexion Weight (lbs)  3    ABduction  Right;Left;20 reps    Shoulder ABduction Weight (lbs)  3    Other Standing Exercises  row into tricep extension/kickback 35 x gong to fatigue 4#      Shoulder Exercises: ROM/Strengthening   UBE (Upper Arm Bike)  L3.5 4 min for 4 min back      Shoulder Exercises: Body Blade   Flexion  30 seconds;2 reps    Other Body Blade Exercises  bicep curl 2 x 30 sec    Other Body Blade Exercises  IR/ER 3 x 30 seconds, abduction x 302 sec x 2               PT Short Term Goals - 02/27/19 1153      PT SHORT TERM GOAL #1  Title  Patient will demonstrate passive shoulder flexion to 60 degrees     Baseline  96    Time  4    Period  Weeks    Status  Achieved    Target Date  02/20/19      PT SHORT TERM GOAL #2   Title  Patient will begin AAROM to the right shoulder ( per MD guidlines)    Baseline  will go to the MD next week     Time  4    Period  Weeks    Status  Achieved    Target Date  02/20/19      PT SHORT TERM GOAL #3   Title  Patient will demonstrate 30 degrees of passive ROM     Baseline  17 degrees     Time  4    Period  Weeks    Status  Achieved        PT Long Term Goals - 07/17/19 0037      PT LONG TERM GOAL #3   Baseline  able to get to lower back but stiffness makes this more difficult      PT LONG TERM GOAL #4   Title  She will be able to lift 5 pounds to head height with no pain to access upper cabinets    Baseline  able to do this but range and movement less than LT better with 3 pounds    Status  Achieved      PT LONG TERM GOAL #5   Title  She  will report able to dress with min modification and perform selfcare normally below shoulder height    Status  Achieved      PT LONG TERM GOAL #6   Title  She will be able to carry 20 pounds with RT arm and place on counter top with no pain    Status  On-going      PT LONG TERM GOAL #7   Title  Active ROM for flexion will be 160 degrees to make cabinet access  and self care easier    Status  On-going      PT LONG TERM GOAL #8   Title  She will have ER to  80 degrees to ease self care and home tasks    Status  On-going            Plan - 07/17/19 0488    Clinical Impression Statement  Fatigue with weakness continus. Her range is stilll limited but  WFL.   She was not able to come in past 2 weeks so  extend  for 2 more weeks   3 weeks total.    Examination-Activity Limitations  Bathing;Hygiene/Grooming;Reach Overhead;Carry;Dressing;Lift;Caring for Others    PT Treatment/Interventions  ADLs/Self Care Home Management;Electrical Stimulation;Cryotherapy;Canalith Repostioning;Moist Heat;Ultrasound;DME Instruction;Gait training;Functional mobility training;Stair training;Therapeutic activities;Therapeutic exercise;Neuromuscular re-education;Patient/family education;Manual techniques;Passive range of motion;Taping    PT Next Visit Plan  contniue strength/endurance and ROM to tolerance,   Measure ROM and  Check goals in a week or 2    PT Home Exercise Plan  pendulum; towel squeeze; elbow pronation/supination, self PROM supine, tableslide PROM (sliding body away opposed to sliding arm fwd) added putty exercises and wrist flex/ext, pro/sup    rockwood red band    Consulted and Agree with Plan of Care  Patient       Patient will benefit from skilled therapeutic intervention in order to improve the following deficits and impairments:  Pain, Decreased  activity tolerance, Decreased endurance, Decreased strength, Decreased range of motion, Impaired UE functional use, Decreased knowledge of precautions,  Decreased safety awareness  Visit Diagnosis: Muscle weakness (generalized)  Stiffness of right shoulder, not elsewhere classified     Problem List Patient Active Problem List   Diagnosis Date Noted  . Post-op pain 12/27/2016  . OSA (obstructive sleep apnea) 08/25/2014    Darrel Hoover  PT 07/17/2019, 9:25 AM  [facility] [address] [place], [place], [address] Phone: [phone]   Fax:  [phone]  Name: Kimberly Haynes MRN: [id number] Date of Birth: November 04, 1960  PHYSICAL THERAPY DISCHARGE SUMMARY  Visits from Start of Care: 28  Current functional level related to goals / functional outcomes: See above. She canceled her next 2 appointments due to illness and did not return. If she needs more PT we would be glad to see her   Remaining deficits: See above   Education / Equipment: HEP  Plan:                                                    Patient goals were partially met. Patient is being discharged due to not returning since the last visit.  ?????  Pearson Forster PT  09/15/19

## 2019-07-23 ENCOUNTER — Ambulatory Visit: Payer: 59 | Admitting: Physical Therapy

## 2019-07-24 ENCOUNTER — Ambulatory Visit: Payer: 59

## 2019-08-02 MED FILL — OXcarbazepine 300 MG TABS: 300 | 30 days supply | Qty: 210 | Fill #1

## 2019-08-18 MED FILL — METOPROLOL SUCCINATE ER 100: 100 | 90 days supply | Qty: 90 | Fill #0

## 2019-08-18 MED FILL — AMLODIPINE BESYLATE 5 MG TA: 5 | 90 days supply | Qty: 90 | Fill #1

## 2019-08-20 MED FILL — LITHIUM CARBONATE 150 MG CA: 150 | 90 days supply | Qty: 90 | Fill #1

## 2019-08-26 MED FILL — VYVANSE 60 MG CAPSULE: 60 | 90 days supply | Qty: 90 | Fill #0

## 2019-08-28 DIAGNOSIS — G4700 Insomnia, unspecified: Secondary | ICD-10-CM | POA: Diagnosis not present

## 2019-08-28 DIAGNOSIS — Z6833 Body mass index (BMI) 33.0-33.9, adult: Secondary | ICD-10-CM | POA: Diagnosis not present

## 2019-08-28 DIAGNOSIS — F419 Anxiety disorder, unspecified: Secondary | ICD-10-CM | POA: Diagnosis not present

## 2019-08-28 DIAGNOSIS — I10 Essential (primary) hypertension: Secondary | ICD-10-CM | POA: Diagnosis not present

## 2019-08-28 DIAGNOSIS — F900 Attention-deficit hyperactivity disorder, predominantly inattentive type: Secondary | ICD-10-CM | POA: Diagnosis not present

## 2019-08-28 DIAGNOSIS — F3181 Bipolar II disorder: Secondary | ICD-10-CM | POA: Diagnosis not present

## 2019-08-28 DIAGNOSIS — G4733 Obstructive sleep apnea (adult) (pediatric): Secondary | ICD-10-CM | POA: Diagnosis not present

## 2019-08-29 MED FILL — LAMOTRIGINE 100 MG TABS: 100 | 90 days supply | Qty: 90 | Fill #0

## 2019-08-29 MED FILL — OXcarbazepine 300 MG TABS: 300 | 30 days supply | Qty: 210 | Fill #0

## 2019-09-29 MED FILL — OXcarbazepine 300 MG TABS: 300 | 30 days supply | Qty: 210 | Fill #1

## 2019-09-29 MED FILL — BUPROPION HCL ER (XL) 300 M: 300 | 30 days supply | Qty: 30 | Fill #0

## 2019-10-23 MED FILL — ZALEPLON 10 MG CAPS: 10 | 90 days supply | Qty: 180 | Fill #0

## 2019-11-01 MED FILL — BUPROPION HCL ER (XL) 300 M: 300 | 30 days supply | Qty: 30 | Fill #1

## 2019-11-01 MED FILL — OXcarbazepine 300 MG TABS: 300 | 30 days supply | Qty: 210 | Fill #2

## 2019-11-13 MED FILL — AMLODIPINE BESYLATE 5 MG TA: 5 | 90 days supply | Qty: 90 | Fill #0

## 2019-11-17 MED FILL — METOPROLOL SUCCINATE ER 100: 100 | 90 days supply | Qty: 90 | Fill #1

## 2019-11-19 ENCOUNTER — Other Ambulatory Visit (HOSPITAL_COMMUNITY)
Admission: RE | Admit: 2019-11-19 | Discharge: 2019-11-19 | Disposition: A | Discharge status: Home / Self Care | Payer: 59 | Source: Ambulatory Visit | Attending: Family Medicine | Admitting: Family Medicine

## 2019-11-19 DIAGNOSIS — Z6836 Body mass index (BMI) 36.0-36.9, adult: Secondary | ICD-10-CM | POA: Diagnosis not present

## 2019-11-19 DIAGNOSIS — Z124 Encounter for screening for malignant neoplasm of cervix: Secondary | ICD-10-CM | POA: Diagnosis not present

## 2019-11-19 DIAGNOSIS — E7800 Pure hypercholesterolemia, unspecified: Secondary | ICD-10-CM | POA: Diagnosis not present

## 2019-11-19 DIAGNOSIS — Z0000 Encounter for general adult medical examination without abnormal findings: Secondary | ICD-10-CM | POA: Diagnosis not present

## 2019-11-19 DIAGNOSIS — F3181 Bipolar II disorder: Secondary | ICD-10-CM | POA: Diagnosis not present

## 2019-11-19 DIAGNOSIS — I10 Essential (primary) hypertension: Secondary | ICD-10-CM | POA: Diagnosis not present

## 2019-11-20 ENCOUNTER — Other Ambulatory Visit: Payer: Self-pay

## 2019-11-20 ENCOUNTER — Ambulatory Visit
Admission: RE | Admit: 2019-11-20 | Discharge: 2019-11-20 | Disposition: A | Discharge status: Home / Self Care | Payer: 59 | Source: Ambulatory Visit | Attending: Family Medicine | Admitting: Family Medicine

## 2019-11-20 DIAGNOSIS — Z1231 Encounter for screening mammogram for malignant neoplasm of breast: Secondary | ICD-10-CM | POA: Diagnosis not present

## 2019-11-21 ENCOUNTER — Other Ambulatory Visit: Payer: Self-pay | Admitting: Family Medicine

## 2019-11-21 DIAGNOSIS — R928 Other abnormal and inconclusive findings on diagnostic imaging of breast: Secondary | ICD-10-CM

## 2019-11-24 LAB — CYTOLOGY - PAP
Comment: NEGATIVE
Diagnosis: NEGATIVE
High risk HPV: NEGATIVE

## 2019-11-24 MED FILL — ROSUVASTATIN CALCIUM 10 MG: 10 | 30 days supply | Qty: 30 | Fill #0

## 2019-11-24 MED FILL — LITHIUM CARBONATE 150 MG CA: 150 | 90 days supply | Qty: 90 | Fill #0

## 2019-11-26 MED FILL — VYVANSE 60 MG CAPSULE: 60 | 90 days supply | Qty: 90 | Fill #0

## 2019-11-28 MED FILL — OXcarbazepine 300 MG TABS: 300 | 30 days supply | Qty: 210 | Fill #3

## 2019-11-29 MED FILL — FLUTICASONE PROP 50 MCG SPR: 50 | 90 days supply | Qty: 48 | Fill #0

## 2019-12-01 MED FILL — LAMOTRIGINE 100 MG TABS: 100 | 90 days supply | Qty: 90 | Fill #1

## 2019-12-02 ENCOUNTER — Ambulatory Visit
Admission: RE | Admit: 2019-12-02 | Discharge: 2019-12-02 | Disposition: A | Discharge status: Home / Self Care | Payer: 59 | Source: Ambulatory Visit | Attending: Family Medicine | Admitting: Family Medicine

## 2019-12-02 ENCOUNTER — Other Ambulatory Visit: Payer: Self-pay | Admitting: Family Medicine

## 2019-12-02 ENCOUNTER — Other Ambulatory Visit: Payer: Self-pay

## 2019-12-02 DIAGNOSIS — R928 Other abnormal and inconclusive findings on diagnostic imaging of breast: Secondary | ICD-10-CM | POA: Diagnosis not present

## 2019-12-02 DIAGNOSIS — N6314 Unspecified lump in the right breast, lower inner quadrant: Secondary | ICD-10-CM | POA: Diagnosis not present

## 2019-12-02 DIAGNOSIS — N6310 Unspecified lump in the right breast, unspecified quadrant: Secondary | ICD-10-CM

## 2019-12-02 DIAGNOSIS — N6313 Unspecified lump in the right breast, lower outer quadrant: Secondary | ICD-10-CM | POA: Diagnosis not present

## 2019-12-03 MED FILL — BUPROPION HCL XL 300 MG TAB: 300 | 30 days supply | Qty: 30 | Fill #2

## 2019-12-04 ENCOUNTER — Other Ambulatory Visit: Payer: 59

## 2019-12-22 MED FILL — ROSUVASTATIN CALCIUM 10 MG: 10 | 60 days supply | Qty: 60 | Fill #1

## 2019-12-25 DIAGNOSIS — Z13220 Encounter for screening for lipoid disorders: Secondary | ICD-10-CM | POA: Diagnosis not present

## 2019-12-25 DIAGNOSIS — R7401 Elevation of levels of liver transaminase levels: Secondary | ICD-10-CM | POA: Diagnosis not present

## 2019-12-31 MED FILL — BuPROPion HCL ER (XL) 300 M: 300 | 30 days supply | Qty: 30 | Fill #3

## 2020-01-08 MED FILL — OXcarbazepine 300 MG TABS: 300 | 30 days supply | Qty: 210 | Fill #4

## 2020-01-09 DIAGNOSIS — H903 Sensorineural hearing loss, bilateral: Secondary | ICD-10-CM | POA: Diagnosis not present

## 2020-01-09 DIAGNOSIS — Z9621 Cochlear implant status: Secondary | ICD-10-CM | POA: Diagnosis not present

## 2020-01-23 ENCOUNTER — Ambulatory Visit: Payer: 59 | Attending: Internal Medicine

## 2020-01-23 DIAGNOSIS — Z23 Encounter for immunization: Secondary | ICD-10-CM

## 2020-01-23 NOTE — Progress Notes (Signed)
   Covid-19 Vaccination Clinic  Name:  Movita Ohrt    MRN: [id number] DOB: 01-18-1961  01/23/2020  Ms. Proffit was observed post Covid-19 immunization for 15 minutes without incident. She was provided with Vaccine Information Sheet and instruction to access the V-Safe system.   Ms. Merriam was instructed to call 911 with any severe reactions post vaccine: Marland Kitchen Difficulty breathing  . Swelling of face and throat  . A fast heartbeat  . A bad rash all over body  . Dizziness and weakness   Immunizations Administered    Name Date Dose VIS Date Route   Pfizer COVID-19 Vaccine 01/23/2020  3:41 PM 0.3 mL 10/10/2019 Intramuscular   Manufacturer: [place]   Lot: [id number]   [place]: [id number]

## 2020-01-28 MED FILL — buPROPion HCL ER (XL) 300 M: 300 | 30 days supply | Qty: 30 | Fill #4

## 2020-02-14 MED FILL — AMLODIPINE BESYLATE 5 MG TA: 5 | 90 days supply | Qty: 90 | Fill #1

## 2020-02-14 MED FILL — METOPROLOL SUCCINATE ER 100: 100 | 90 days supply | Qty: 90 | Fill #0

## 2020-02-16 MED FILL — ROSUVASTATIN CALCIUM 10 MG: 10 | 60 days supply | Qty: 60 | Fill #0

## 2020-02-17 ENCOUNTER — Ambulatory Visit: Payer: 59 | Attending: Internal Medicine

## 2020-02-17 DIAGNOSIS — Z23 Encounter for immunization: Secondary | ICD-10-CM

## 2020-02-17 NOTE — Progress Notes (Signed)
   Covid-19 Vaccination Clinic  Name:  Kimberly Haynes    MRN: [id number] DOB: 04/15/1961  02/17/2020  [name] was observed post Covid-19 immunization for 15 minutes without incident. She was provided with Vaccine Information Sheet and instruction to access the V-Safe system.   [name] was instructed to call 911 with any severe reactions post vaccine: Marland Kitchen Difficulty breathing  . Swelling of face and throat  . A fast heartbeat  . A bad rash all over body  . Dizziness and weakness   Immunizations Administered    Name Date Dose VIS Date Route   Pfizer COVID-19 Vaccine 02/17/2020  4:24 PM 0.3 mL 12/24/2018 Intramuscular   Manufacturer: [place]   Lot: [id number]   [place]: [id number]

## 2020-02-20 MED FILL — LITHIUM CARBONATE 150 MG CA: 150 | 90 days supply | Qty: 90 | Fill #1

## 2020-02-25 MED FILL — buPROPion HCL ER (XL) 300 M: 300 | 90 days supply | Qty: 90 | Fill #0

## 2020-02-25 MED FILL — LAMOTRIGINE 100 MG TABS: 100 | 90 days supply | Qty: 90 | Fill #0

## 2020-02-27 IMAGING — DX PORTABLE RIGHT SHOULDER
1 series · 1 of 1 positions shown · non-contrast
Comparison: Intraoperative images January 12, 2019

CLINICAL DATA: Postoperative screw and plate fixation

EXAM:
PORTABLE RIGHT SHOULDER

[shoulder ap]
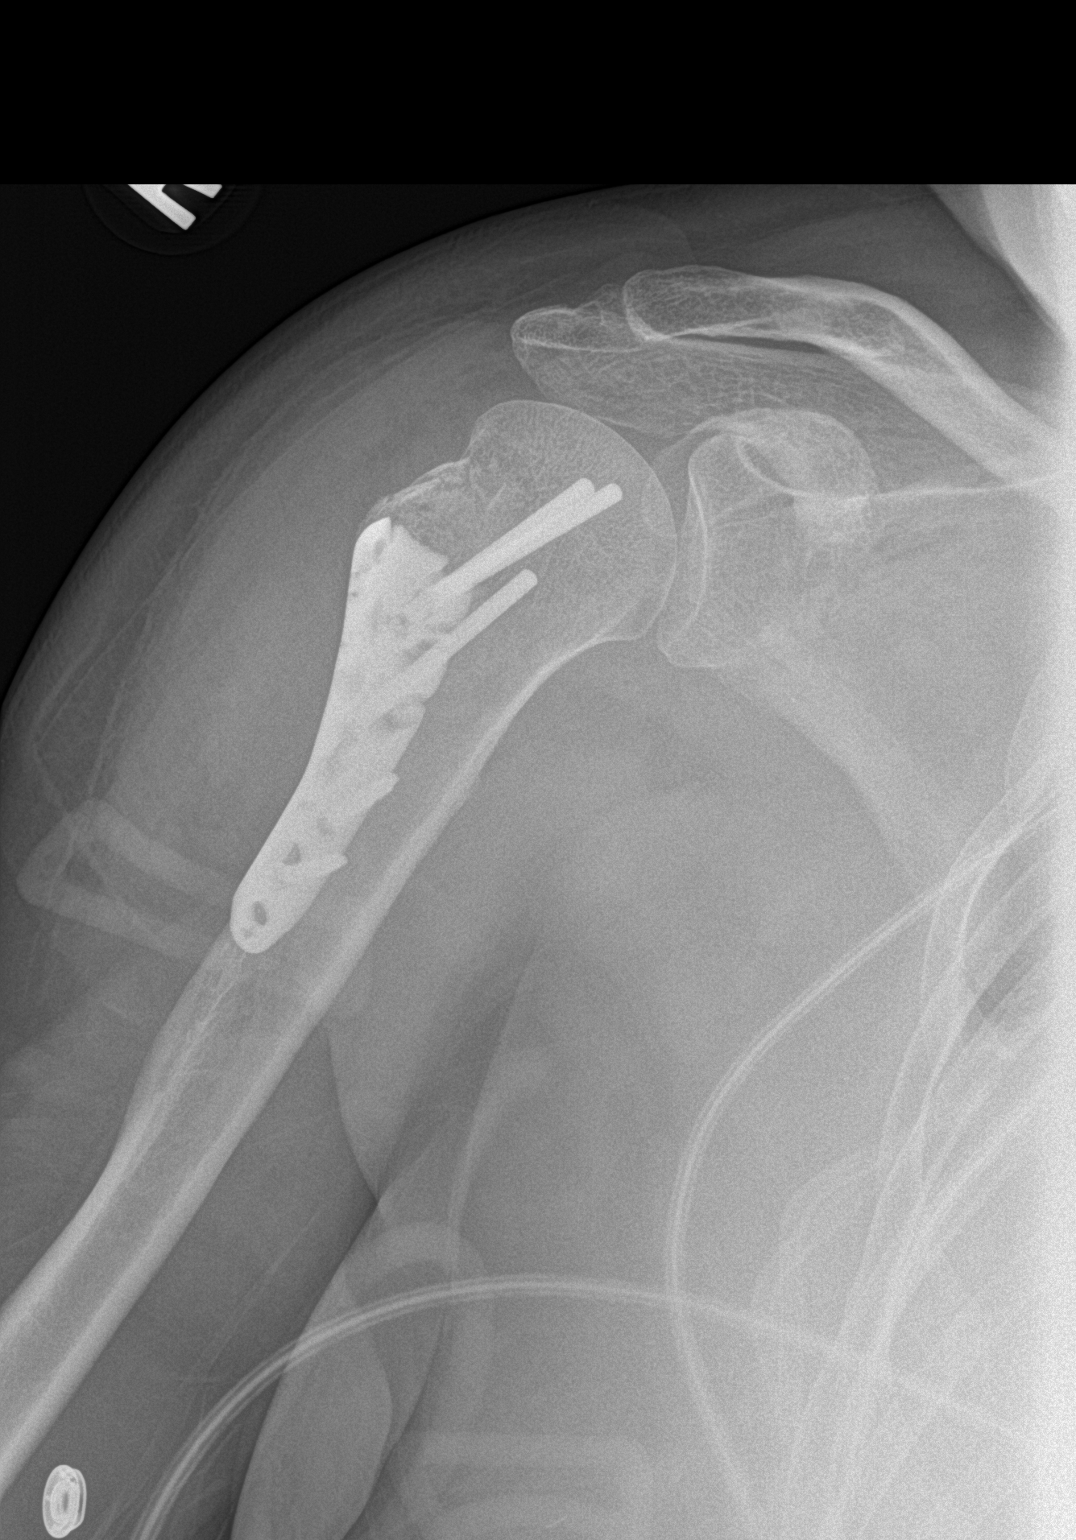

[1 of 1 positions shown; findings below may reference images not displayed]

FINDINGS: Oblique view obtained. There is screw and plate fixation in the
proximal humerus with support hardware intact. Fracture along the
lateral right humeral metaphysis-diaphysis junction is essentially
anatomic. No dislocation. No appreciable joint space narrowing.
IMPRESSION: On single view, fracture along the lateral proximal humerus appears
in essentially anatomic alignment. Screw and plate fixation hardware
intact. No dislocation. No appreciable arthropathy.

## 2020-02-27 MED FILL — VYVANSE 60 MG CAPSULE: 60 | 90 days supply | Qty: 90 | Fill #0

## 2020-02-27 MED FILL — OXcarbazepine 300 MG TABS: 300 | 30 days supply | Qty: 210 | Fill #0

## 2020-03-02 ENCOUNTER — Other Ambulatory Visit (HOSPITAL_COMMUNITY): Payer: Self-pay | Admitting: Family Medicine

## 2020-03-02 DIAGNOSIS — I10 Essential (primary) hypertension: Secondary | ICD-10-CM | POA: Diagnosis not present

## 2020-03-02 DIAGNOSIS — F900 Attention-deficit hyperactivity disorder, predominantly inattentive type: Secondary | ICD-10-CM | POA: Diagnosis not present

## 2020-03-02 DIAGNOSIS — G4733 Obstructive sleep apnea (adult) (pediatric): Secondary | ICD-10-CM | POA: Diagnosis not present

## 2020-03-02 DIAGNOSIS — F419 Anxiety disorder, unspecified: Secondary | ICD-10-CM | POA: Diagnosis not present

## 2020-03-02 DIAGNOSIS — Z6833 Body mass index (BMI) 33.0-33.9, adult: Secondary | ICD-10-CM | POA: Diagnosis not present

## 2020-03-02 DIAGNOSIS — F3181 Bipolar II disorder: Secondary | ICD-10-CM | POA: Diagnosis not present

## 2020-03-02 DIAGNOSIS — G4700 Insomnia, unspecified: Secondary | ICD-10-CM | POA: Diagnosis not present

## 2020-03-11 DIAGNOSIS — F319 Bipolar disorder, unspecified: Secondary | ICD-10-CM | POA: Diagnosis not present

## 2020-03-11 DIAGNOSIS — R7303 Prediabetes: Secondary | ICD-10-CM | POA: Diagnosis not present

## 2020-03-11 DIAGNOSIS — Z833 Family history of diabetes mellitus: Secondary | ICD-10-CM | POA: Diagnosis not present

## 2020-03-11 DIAGNOSIS — I10 Essential (primary) hypertension: Secondary | ICD-10-CM | POA: Diagnosis not present

## 2020-03-11 DIAGNOSIS — Z9621 Cochlear implant status: Secondary | ICD-10-CM | POA: Diagnosis not present

## 2020-03-11 DIAGNOSIS — E7800 Pure hypercholesterolemia, unspecified: Secondary | ICD-10-CM | POA: Diagnosis not present

## 2020-03-11 DIAGNOSIS — Z6835 Body mass index (BMI) 35.0-35.9, adult: Secondary | ICD-10-CM | POA: Diagnosis not present

## 2020-03-16 MED FILL — ZALEPLON 10 MG CAPS: 10 | 90 days supply | Qty: 180 | Fill #0

## 2020-03-19 MED FILL — OZEMPIC 0.25 OR 0.5 MG/DOSE: 2 | 42 days supply | Qty: 2 | Fill #0

## 2020-03-22 DIAGNOSIS — H04123 Dry eye syndrome of bilateral lacrimal glands: Secondary | ICD-10-CM | POA: Diagnosis not present

## 2020-03-22 DIAGNOSIS — H1033 Unspecified acute conjunctivitis, bilateral: Secondary | ICD-10-CM | POA: Diagnosis not present

## 2020-03-22 DIAGNOSIS — H1045 Other chronic allergic conjunctivitis: Secondary | ICD-10-CM | POA: Diagnosis not present

## 2020-03-22 DIAGNOSIS — H10503 Unspecified blepharoconjunctivitis, bilateral: Secondary | ICD-10-CM | POA: Diagnosis not present

## 2020-03-22 MED FILL — NEO/POLYMYXIN/DEXAMETH DROP: 3.5-10000-0 | 13 days supply | Qty: 5 | Fill #0

## 2020-03-31 DIAGNOSIS — G4733 Obstructive sleep apnea (adult) (pediatric): Secondary | ICD-10-CM | POA: Diagnosis not present

## 2020-04-12 MED FILL — OXcarbazepine 300 MG TABS: 300 | 30 days supply | Qty: 210 | Fill #0

## 2020-04-28 MED FILL — OZEMPIC (1 MG/DOSE) 4 MG/3M: 4 | 28 days supply | Qty: 3 | Fill #0

## 2020-05-18 MED FILL — METOPROLOL SUCCINATE ER 100: 100 | 90 days supply | Qty: 90 | Fill #1

## 2020-05-18 MED FILL — AMLODIPINE BESYLATE 5 MG TA: 5 | 90 days supply | Qty: 90 | Fill #0

## 2020-05-18 MED FILL — buPROPion HCL ER (XL) 300 M: 300 | 90 days supply | Qty: 90 | Fill #0

## 2020-05-20 DIAGNOSIS — H903 Sensorineural hearing loss, bilateral: Secondary | ICD-10-CM | POA: Diagnosis not present

## 2020-05-20 MED FILL — LAMOTRIGINE 100 MG TABS: 100 | 90 days supply | Qty: 90 | Fill #0

## 2020-05-20 MED FILL — LITHIUM CARBONATE 150 MG CA: 150 | 90 days supply | Qty: 90 | Fill #0

## 2020-05-25 DIAGNOSIS — I10 Essential (primary) hypertension: Secondary | ICD-10-CM | POA: Diagnosis not present

## 2020-05-25 DIAGNOSIS — R7303 Prediabetes: Secondary | ICD-10-CM | POA: Diagnosis not present

## 2020-05-25 DIAGNOSIS — Z6835 Body mass index (BMI) 35.0-35.9, adult: Secondary | ICD-10-CM | POA: Diagnosis not present

## 2020-05-26 MED FILL — VYVANSE 60 MG CAPSULE: 60 | 90 days supply | Qty: 90 | Fill #0

## 2020-05-28 MED FILL — OXcarbazepine 300 MG TABS: 300 | 30 days supply | Qty: 210 | Fill #1

## 2020-06-03 ENCOUNTER — Other Ambulatory Visit: Payer: Self-pay

## 2020-06-03 ENCOUNTER — Ambulatory Visit
Admission: RE | Admit: 2020-06-03 | Discharge: 2020-06-03 | Disposition: A | Discharge status: Home / Self Care | Payer: 59 | Source: Ambulatory Visit | Attending: Family Medicine | Admitting: Family Medicine

## 2020-06-03 DIAGNOSIS — N6310 Unspecified lump in the right breast, unspecified quadrant: Secondary | ICD-10-CM

## 2020-06-03 DIAGNOSIS — N6011 Diffuse cystic mastopathy of right breast: Secondary | ICD-10-CM | POA: Diagnosis not present

## 2020-06-04 ENCOUNTER — Other Ambulatory Visit: Payer: 59

## 2020-06-10 MED FILL — ROSUVASTATIN CALCIUM 10 MG: 10 | 60 days supply | Qty: 60 | Fill #0

## 2020-06-24 MED FILL — OZEMPIC (1 MG/DOSE) 4 MG/3M: 4 | 28 days supply | Qty: 3 | Fill #0

## 2020-06-30 MED FILL — ZALEPLON 10 MG CAPS: 10 | 90 days supply | Qty: 180 | Fill #1

## 2020-07-15 MED FILL — OXcarbazepine 300 MG TABS: 300 | 30 days supply | Qty: 210 | Fill #2

## 2020-08-12 MED FILL — METOPROLOL SUCCINATE ER 100: 100 | 90 days supply | Qty: 90 | Fill #0

## 2020-08-12 MED FILL — AMLODIPINE BESYLATE 5 MG TA: 5 | 90 days supply | Qty: 90 | Fill #1

## 2020-08-24 ENCOUNTER — Other Ambulatory Visit (HOSPITAL_COMMUNITY): Payer: Self-pay | Admitting: Family Medicine

## 2020-08-24 MED FILL — LITHIUM CARBONATE 150 MG CA: 150 | 90 days supply | Qty: 90 | Fill #1

## 2020-08-26 ENCOUNTER — Other Ambulatory Visit (HOSPITAL_COMMUNITY): Payer: Self-pay | Admitting: Internal Medicine

## 2020-08-26 DIAGNOSIS — Z6831 Body mass index (BMI) 31.0-31.9, adult: Secondary | ICD-10-CM | POA: Diagnosis not present

## 2020-08-26 DIAGNOSIS — I10 Essential (primary) hypertension: Secondary | ICD-10-CM | POA: Diagnosis not present

## 2020-08-26 DIAGNOSIS — R7303 Prediabetes: Secondary | ICD-10-CM | POA: Diagnosis not present

## 2020-08-27 MED FILL — LAMOTRIGINE 100 MG TABS: 100 | 90 days supply | Qty: 90 | Fill #1

## 2020-08-27 MED FILL — buPROPion HCL ER (XL) 300 M: 300 | 90 days supply | Qty: 90 | Fill #1

## 2020-09-02 MED FILL — WEGOVY 1.7 MG/0.75ML SOAJ: 1.7 | 28 days supply | Qty: 3 | Fill #0

## 2020-09-13 ENCOUNTER — Other Ambulatory Visit (HOSPITAL_COMMUNITY): Payer: Self-pay | Admitting: Family Medicine

## 2020-09-13 DIAGNOSIS — F419 Anxiety disorder, unspecified: Secondary | ICD-10-CM | POA: Diagnosis not present

## 2020-09-13 DIAGNOSIS — I10 Essential (primary) hypertension: Secondary | ICD-10-CM | POA: Diagnosis not present

## 2020-09-13 DIAGNOSIS — G4700 Insomnia, unspecified: Secondary | ICD-10-CM | POA: Diagnosis not present

## 2020-09-13 DIAGNOSIS — F3181 Bipolar II disorder: Secondary | ICD-10-CM | POA: Diagnosis not present

## 2020-09-13 DIAGNOSIS — F900 Attention-deficit hyperactivity disorder, predominantly inattentive type: Secondary | ICD-10-CM | POA: Diagnosis not present

## 2020-09-13 DIAGNOSIS — Z1159 Encounter for screening for other viral diseases: Secondary | ICD-10-CM | POA: Diagnosis not present

## 2020-09-13 DIAGNOSIS — E7800 Pure hypercholesterolemia, unspecified: Secondary | ICD-10-CM | POA: Diagnosis not present

## 2020-09-13 DIAGNOSIS — G4733 Obstructive sleep apnea (adult) (pediatric): Secondary | ICD-10-CM | POA: Diagnosis not present

## 2020-09-13 DIAGNOSIS — Z23 Encounter for immunization: Secondary | ICD-10-CM | POA: Diagnosis not present

## 2020-09-13 MED FILL — OXcarbazepine 300 MG TABS: 300 | 30 days supply | Qty: 220 | Fill #0

## 2020-09-23 MED FILL — VYVANSE 60 MG CAPSULE: 60 | 90 days supply | Qty: 90 | Fill #0

## 2020-09-30 MED FILL — WEGOVY 1.7 MG/0.75ML SOAJ: 1.7 | 28 days supply | Qty: 3 | Fill #1

## 2020-10-27 ENCOUNTER — Other Ambulatory Visit (HOSPITAL_COMMUNITY): Payer: Self-pay | Admitting: Internal Medicine

## 2020-10-27 MED FILL — WEGOVY 1.7 MG/0.75ML SOAJ: 1.7 | 28 days supply | Qty: 3 | Fill #0

## 2020-10-27 MED FILL — ROSUVASTATIN CALCIUM 10 MG: 10 | 60 days supply | Qty: 60 | Fill #0

## 2020-11-09 MED FILL — AMLODIPINE BESYLATE 5 MG TA: 5 | 90 days supply | Qty: 90 | Fill #0

## 2020-11-12 ENCOUNTER — Other Ambulatory Visit: Payer: Self-pay | Admitting: Family Medicine

## 2020-11-12 DIAGNOSIS — Z1231 Encounter for screening mammogram for malignant neoplasm of breast: Secondary | ICD-10-CM

## 2020-11-16 MED FILL — METOPROLOL SUCCINATE ER 100: 100 | 90 days supply | Qty: 90 | Fill #1

## 2020-11-16 MED FILL — OXcarbazepine 300 MG TABS: 300 | 30 days supply | Qty: 220 | Fill #1

## 2020-11-17 ENCOUNTER — Other Ambulatory Visit (HOSPITAL_COMMUNITY): Payer: Self-pay | Admitting: Family Medicine

## 2020-11-17 MED FILL — LAMOTRIGINE 100 MG TABS: 100 | 90 days supply | Qty: 90 | Fill #0

## 2020-11-17 MED FILL — LITHIUM CARBONATE 150 MG CA: 150 | 90 days supply | Qty: 90 | Fill #0

## 2020-11-24 MED FILL — WEGOVY 1.7 MG/0.75ML SOAJ: 1.7 | 28 days supply | Qty: 3 | Fill #1

## 2020-11-26 MED FILL — ZALEPLON 10 MG CAPS: 10 | 90 days supply | Qty: 180 | Fill #0

## 2020-12-03 MED FILL — buPROPion HCL ER (XL) 300 M: 300 | 90 days supply | Qty: 90 | Fill #0

## 2020-12-06 DIAGNOSIS — Z0000 Encounter for general adult medical examination without abnormal findings: Secondary | ICD-10-CM | POA: Diagnosis not present

## 2020-12-06 DIAGNOSIS — E7800 Pure hypercholesterolemia, unspecified: Secondary | ICD-10-CM | POA: Diagnosis not present

## 2020-12-06 DIAGNOSIS — I10 Essential (primary) hypertension: Secondary | ICD-10-CM | POA: Diagnosis not present

## 2020-12-06 DIAGNOSIS — L659 Nonscarring hair loss, unspecified: Secondary | ICD-10-CM | POA: Diagnosis not present

## 2020-12-06 DIAGNOSIS — Z23 Encounter for immunization: Secondary | ICD-10-CM | POA: Diagnosis not present

## 2020-12-23 ENCOUNTER — Other Ambulatory Visit (HOSPITAL_COMMUNITY): Payer: Self-pay | Admitting: Internal Medicine

## 2020-12-23 MED FILL — WEGOVY 1.7 MG/0.75ML SOAJ: 1.7 | 28 days supply | Qty: 3 | Fill #0

## 2020-12-28 ENCOUNTER — Other Ambulatory Visit (HOSPITAL_COMMUNITY): Payer: Self-pay | Admitting: Internal Medicine

## 2020-12-28 ENCOUNTER — Ambulatory Visit: Payer: 59

## 2020-12-28 DIAGNOSIS — Z6831 Body mass index (BMI) 31.0-31.9, adult: Secondary | ICD-10-CM | POA: Diagnosis not present

## 2020-12-28 DIAGNOSIS — R7303 Prediabetes: Secondary | ICD-10-CM | POA: Diagnosis not present

## 2020-12-28 DIAGNOSIS — I10 Essential (primary) hypertension: Secondary | ICD-10-CM | POA: Diagnosis not present

## 2020-12-28 MED FILL — OXcarbazepine 300 MG TABS: 300 | 30 days supply | Qty: 220 | Fill #2

## 2020-12-28 MED FILL — WEGOVY 2.4 MG/0.75ML SOAJ: 2.4 | 28 days supply | Qty: 3 | Fill #0

## 2020-12-29 ENCOUNTER — Other Ambulatory Visit (HOSPITAL_COMMUNITY): Payer: Self-pay | Admitting: Family Medicine

## 2020-12-29 MED FILL — VYVANSE 60 MG CAPSULE: 60 | 90 days supply | Qty: 90 | Fill #0

## 2021-01-18 ENCOUNTER — Other Ambulatory Visit (HOSPITAL_BASED_OUTPATIENT_CLINIC_OR_DEPARTMENT_OTHER): Payer: Self-pay

## 2021-01-24 DIAGNOSIS — H9193 Unspecified hearing loss, bilateral: Secondary | ICD-10-CM | POA: Diagnosis not present

## 2021-01-24 DIAGNOSIS — Z9621 Cochlear implant status: Secondary | ICD-10-CM | POA: Diagnosis not present

## 2021-02-03 DIAGNOSIS — Z09 Encounter for follow-up examination after completed treatment for conditions other than malignant neoplasm: Secondary | ICD-10-CM | POA: Diagnosis not present

## 2021-02-03 DIAGNOSIS — Z9621 Cochlear implant status: Secondary | ICD-10-CM | POA: Diagnosis not present

## 2021-02-03 DIAGNOSIS — H903 Sensorineural hearing loss, bilateral: Secondary | ICD-10-CM | POA: Diagnosis not present

## 2021-02-09 ENCOUNTER — Other Ambulatory Visit (HOSPITAL_COMMUNITY): Payer: Self-pay

## 2021-02-09 MED FILL — Semaglutide (Weight Mngmt) Soln Auto-Injector 2.4 MG/0.75ML: SUBCUTANEOUS | 28 days supply | Qty: 3 | Fill #0 | Status: AC

## 2021-02-10 ENCOUNTER — Other Ambulatory Visit (HOSPITAL_COMMUNITY): Payer: Self-pay

## 2021-02-11 ENCOUNTER — Other Ambulatory Visit (HOSPITAL_COMMUNITY): Payer: Self-pay

## 2021-02-11 MED FILL — Amlodipine Besylate Tab 5 MG (Base Equivalent): ORAL | 90 days supply | Qty: 90 | Fill #0 | Status: AC

## 2021-02-14 ENCOUNTER — Other Ambulatory Visit (HOSPITAL_COMMUNITY): Payer: Self-pay

## 2021-02-14 MED ORDER — METOPROLOL SUCCINATE ER 100 MG PO TB24
100.0000 mg | ORAL_TABLET | Freq: Every morning | ORAL | 0 refills | Status: DC
  Filled 2021-02-14: qty 90, 90d supply, fill #0

## 2021-02-16 ENCOUNTER — Other Ambulatory Visit: Payer: Self-pay

## 2021-02-16 ENCOUNTER — Ambulatory Visit
Admission: RE | Admit: 2021-02-16 | Discharge: 2021-02-16 | Disposition: A | Discharge status: Home / Self Care | Payer: 59 | Source: Ambulatory Visit | Attending: Family Medicine | Admitting: Family Medicine

## 2021-02-16 DIAGNOSIS — Z1231 Encounter for screening mammogram for malignant neoplasm of breast: Secondary | ICD-10-CM

## 2021-02-22 ENCOUNTER — Other Ambulatory Visit (HOSPITAL_COMMUNITY): Payer: Self-pay

## 2021-02-22 MED FILL — Lithium Carbonate Cap 150 MG: ORAL | 90 days supply | Qty: 90 | Fill #0 | Status: AC

## 2021-02-22 MED FILL — Bupropion HCl Tab ER 24HR 300 MG: ORAL | 90 days supply | Qty: 90 | Fill #0 | Status: AC

## 2021-02-22 MED FILL — Lamotrigine Tab 100 MG: ORAL | 90 days supply | Qty: 90 | Fill #0 | Status: AC

## 2021-02-22 MED FILL — Oxcarbazepine Tab 300 MG: ORAL | 90 days supply | Qty: 210 | Fill #0 | Status: AC

## 2021-02-23 ENCOUNTER — Other Ambulatory Visit (HOSPITAL_COMMUNITY): Payer: Self-pay

## 2021-02-23 MED ORDER — ROSUVASTATIN CALCIUM 10 MG PO TABS
ORAL_TABLET | ORAL | 0 refills | Status: DC
  Filled 2021-02-23: qty 60, 60d supply, fill #0

## 2021-02-28 ENCOUNTER — Other Ambulatory Visit (HOSPITAL_COMMUNITY): Payer: Self-pay

## 2021-02-28 DIAGNOSIS — E7800 Pure hypercholesterolemia, unspecified: Secondary | ICD-10-CM | POA: Diagnosis not present

## 2021-02-28 DIAGNOSIS — F419 Anxiety disorder, unspecified: Secondary | ICD-10-CM | POA: Diagnosis not present

## 2021-02-28 DIAGNOSIS — F900 Attention-deficit hyperactivity disorder, predominantly inattentive type: Secondary | ICD-10-CM | POA: Diagnosis not present

## 2021-02-28 DIAGNOSIS — G4733 Obstructive sleep apnea (adult) (pediatric): Secondary | ICD-10-CM | POA: Diagnosis not present

## 2021-02-28 DIAGNOSIS — G4700 Insomnia, unspecified: Secondary | ICD-10-CM | POA: Diagnosis not present

## 2021-02-28 DIAGNOSIS — F3181 Bipolar II disorder: Secondary | ICD-10-CM | POA: Diagnosis not present

## 2021-02-28 DIAGNOSIS — I10 Essential (primary) hypertension: Secondary | ICD-10-CM | POA: Diagnosis not present

## 2021-02-28 DIAGNOSIS — Z6833 Body mass index (BMI) 33.0-33.9, adult: Secondary | ICD-10-CM | POA: Diagnosis not present

## 2021-02-28 MED ORDER — ZALEPLON 10 MG PO CAPS
10.0000 mg | ORAL_CAPSULE | Freq: Every evening | ORAL | 1 refills | Status: AC | PRN
  Filled 2021-04-04 – 2021-04-11 (×2): qty 180, 90d supply, fill #0
  Filled 2021-08-16: qty 180, 90d supply, fill #1

## 2021-02-28 MED ORDER — LITHIUM CARBONATE 150 MG PO CAPS: 150.0000 mg | ORAL_CAPSULE | Freq: Every evening | ORAL | 1 refills | Status: AC

## 2021-02-28 MED ORDER — LAMOTRIGINE 100 MG PO TABS
100.0000 mg | ORAL_TABLET | Freq: Every evening | ORAL | 1 refills | Status: DC
  Filled 2021-05-06: qty 90, 90d supply, fill #0
  Filled 2021-08-21: qty 90, 90d supply, fill #1

## 2021-02-28 MED ORDER — OXCARBAZEPINE 300 MG PO TABS
900.0000 mg | ORAL_TABLET | Freq: Every morning | ORAL | 5 refills | Status: AC
  Filled 2021-06-20: qty 210, 30d supply, fill #0
  Filled 2021-07-25: qty 210, 30d supply, fill #1

## 2021-02-28 MED ORDER — AMLODIPINE BESYLATE 5 MG PO TABS
5.0000 mg | ORAL_TABLET | Freq: Every morning | ORAL | 1 refills | Status: DC
  Filled 2021-05-03: qty 90, 90d supply, fill #0
  Filled 2021-07-25: qty 90, 90d supply, fill #1

## 2021-02-28 MED ORDER — BUPROPION HCL ER (XL) 300 MG PO TB24
300.0000 mg | ORAL_TABLET | Freq: Every morning | ORAL | 1 refills | Status: DC
  Filled 2021-06-07: qty 90, 90d supply, fill #0
  Filled 2021-08-29: qty 90, 90d supply, fill #1

## 2021-02-28 MED ORDER — VYVANSE 60 MG PO CAPS
60.0000 mg | ORAL_CAPSULE | Freq: Every morning | ORAL | 0 refills | Status: AC
  Filled 2021-03-29: qty 90, 90d supply, fill #0

## 2021-02-28 MED ORDER — METOPROLOL SUCCINATE ER 100 MG PO TB24
100.0000 mg | ORAL_TABLET | Freq: Every morning | ORAL | 1 refills | Status: DC
  Filled 2021-05-03: qty 90, 90d supply, fill #0
  Filled 2021-08-10: qty 90, 90d supply, fill #1

## 2021-03-02 MED FILL — Semaglutide (Weight Mngmt) Soln Auto-Injector 2.4 MG/0.75ML: SUBCUTANEOUS | 28 days supply | Qty: 3 | Fill #1 | Status: AC

## 2021-03-03 ENCOUNTER — Other Ambulatory Visit (HOSPITAL_COMMUNITY): Payer: Self-pay

## 2021-03-03 DIAGNOSIS — G4733 Obstructive sleep apnea (adult) (pediatric): Secondary | ICD-10-CM | POA: Diagnosis not present

## 2021-03-10 ENCOUNTER — Other Ambulatory Visit (HOSPITAL_COMMUNITY): Payer: Self-pay

## 2021-03-11 ENCOUNTER — Other Ambulatory Visit (HOSPITAL_COMMUNITY): Payer: Self-pay

## 2021-03-12 ENCOUNTER — Other Ambulatory Visit (HOSPITAL_COMMUNITY): Payer: Self-pay

## 2021-03-14 ENCOUNTER — Other Ambulatory Visit (HOSPITAL_COMMUNITY): Payer: Self-pay

## 2021-03-15 ENCOUNTER — Other Ambulatory Visit (HOSPITAL_COMMUNITY): Payer: Self-pay

## 2021-03-16 ENCOUNTER — Other Ambulatory Visit (HOSPITAL_COMMUNITY): Payer: Self-pay

## 2021-03-19 ENCOUNTER — Other Ambulatory Visit (HOSPITAL_COMMUNITY): Payer: Self-pay

## 2021-03-29 ENCOUNTER — Other Ambulatory Visit (HOSPITAL_COMMUNITY): Payer: Self-pay

## 2021-04-02 ENCOUNTER — Other Ambulatory Visit (HOSPITAL_COMMUNITY): Payer: Self-pay

## 2021-04-04 ENCOUNTER — Other Ambulatory Visit (HOSPITAL_COMMUNITY): Payer: Self-pay

## 2021-04-09 ENCOUNTER — Other Ambulatory Visit (HOSPITAL_COMMUNITY): Payer: Self-pay

## 2021-04-11 ENCOUNTER — Other Ambulatory Visit (HOSPITAL_COMMUNITY): Payer: Self-pay

## 2021-04-11 MED FILL — Oxcarbazepine Tab 300 MG: ORAL | 30 days supply | Qty: 210 | Fill #1 | Status: CN

## 2021-04-12 ENCOUNTER — Other Ambulatory Visit (HOSPITAL_COMMUNITY): Payer: Self-pay

## 2021-04-13 ENCOUNTER — Other Ambulatory Visit (HOSPITAL_COMMUNITY): Payer: Self-pay

## 2021-04-13 MED FILL — Semaglutide (Weight Mngmt) Soln Auto-Injector 2.4 MG/0.75ML: SUBCUTANEOUS | 28 days supply | Qty: 3 | Fill #2 | Status: AC

## 2021-04-14 ENCOUNTER — Other Ambulatory Visit (HOSPITAL_COMMUNITY): Payer: Self-pay

## 2021-04-14 MED ORDER — OXCARBAZEPINE 300 MG PO TABS
ORAL_TABLET | ORAL | 0 refills | Status: AC
  Filled 2021-04-14: qty 220, 31d supply, fill #0
  Filled 2021-04-15: qty 220, 30d supply, fill #0

## 2021-04-15 ENCOUNTER — Other Ambulatory Visit (HOSPITAL_COMMUNITY): Payer: Self-pay

## 2021-04-26 ENCOUNTER — Other Ambulatory Visit: Payer: Self-pay

## 2021-04-26 ENCOUNTER — Ambulatory Visit
Admission: RE | Admit: 2021-04-26 | Discharge: 2021-04-26 | Disposition: A | Discharge status: Home / Self Care | Payer: 59 | Source: Ambulatory Visit | Attending: Family Medicine | Admitting: Family Medicine

## 2021-04-26 DIAGNOSIS — Z1231 Encounter for screening mammogram for malignant neoplasm of breast: Secondary | ICD-10-CM | POA: Diagnosis not present

## 2021-05-03 ENCOUNTER — Other Ambulatory Visit (HOSPITAL_COMMUNITY): Payer: Self-pay

## 2021-05-03 DIAGNOSIS — Z6824 Body mass index (BMI) 24.0-24.9, adult: Secondary | ICD-10-CM | POA: Diagnosis not present

## 2021-05-03 DIAGNOSIS — R7303 Prediabetes: Secondary | ICD-10-CM | POA: Diagnosis not present

## 2021-05-03 DIAGNOSIS — I10 Essential (primary) hypertension: Secondary | ICD-10-CM | POA: Diagnosis not present

## 2021-05-03 MED ORDER — WEGOVY 2.4 MG/0.75ML ~~LOC~~ SOAJ
SUBCUTANEOUS | 3 refills | Status: AC
  Filled 2021-05-03: qty 9, 84d supply, fill #0
  Filled 2021-05-05: qty 12, 84d supply, fill #0
  Filled 2021-05-05: qty 3, 28d supply, fill #0
  Filled 2021-06-08: qty 3, 28d supply, fill #1
  Filled 2021-07-07: qty 3, 28d supply, fill #2
  Filled 2021-08-05: qty 3, 28d supply, fill #3
  Filled 2021-09-06: qty 9, 84d supply, fill #4

## 2021-05-05 ENCOUNTER — Other Ambulatory Visit (HOSPITAL_COMMUNITY): Payer: Self-pay

## 2021-05-06 ENCOUNTER — Other Ambulatory Visit (HOSPITAL_COMMUNITY): Payer: Self-pay

## 2021-05-07 ENCOUNTER — Other Ambulatory Visit (HOSPITAL_COMMUNITY): Payer: Self-pay

## 2021-05-09 ENCOUNTER — Other Ambulatory Visit (HOSPITAL_COMMUNITY): Payer: Self-pay

## 2021-05-09 MED ORDER — ROSUVASTATIN CALCIUM 10 MG PO TABS
ORAL_TABLET | ORAL | 2 refills | Status: AC
  Filled 2021-05-09: qty 60, 60d supply, fill #0

## 2021-05-09 MED ORDER — LITHIUM CARBONATE 150 MG PO CAPS
ORAL_CAPSULE | ORAL | 1 refills | Status: DC
  Filled 2021-05-09: qty 90, 90d supply, fill #0
  Filled 2021-07-28: qty 90, 90d supply, fill #1

## 2021-05-09 MED ORDER — ESTRADIOL 10 MCG VA TABS
ORAL_TABLET | VAGINAL | 1 refills | Status: AC
  Filled 2021-05-09: qty 24, 84d supply, fill #0

## 2021-05-09 MED ORDER — ROSUVASTATIN CALCIUM 10 MG PO TABS
10.0000 mg | ORAL_TABLET | Freq: Every day | ORAL | 1 refills | Status: AC
  Filled 2021-05-09: qty 90, 90d supply, fill #0
  Filled 2021-11-28: qty 90, 90d supply, fill #1

## 2021-05-10 ENCOUNTER — Other Ambulatory Visit (HOSPITAL_COMMUNITY): Payer: Self-pay

## 2021-06-07 ENCOUNTER — Other Ambulatory Visit (HOSPITAL_COMMUNITY): Payer: Self-pay

## 2021-06-09 ENCOUNTER — Other Ambulatory Visit (HOSPITAL_COMMUNITY): Payer: Self-pay

## 2021-06-19 ENCOUNTER — Other Ambulatory Visit (HOSPITAL_COMMUNITY): Payer: Self-pay

## 2021-06-20 ENCOUNTER — Other Ambulatory Visit (HOSPITAL_COMMUNITY): Payer: Self-pay

## 2021-06-20 MED ORDER — OXCARBAZEPINE 300 MG PO TABS
ORAL_TABLET | ORAL | 0 refills | Status: AC
  Filled 2021-06-20: qty 220, 31d supply, fill #0

## 2021-06-21 ENCOUNTER — Other Ambulatory Visit (HOSPITAL_COMMUNITY): Payer: Self-pay

## 2021-06-21 MED ORDER — OXCARBAZEPINE 300 MG PO TABS
ORAL_TABLET | ORAL | 1 refills | Status: AC
  Filled 2021-06-21: qty 210, 30d supply, fill #0

## 2021-06-29 ENCOUNTER — Other Ambulatory Visit (HOSPITAL_COMMUNITY): Payer: Self-pay

## 2021-07-01 ENCOUNTER — Other Ambulatory Visit (HOSPITAL_COMMUNITY): Payer: Self-pay

## 2021-07-01 MED ORDER — VYVANSE 60 MG PO CAPS
ORAL_CAPSULE | ORAL | 0 refills | Status: DC
  Filled 2021-07-01: qty 90, 90d supply, fill #0

## 2021-07-05 ENCOUNTER — Other Ambulatory Visit (HOSPITAL_COMMUNITY): Payer: Self-pay

## 2021-07-08 ENCOUNTER — Other Ambulatory Visit (HOSPITAL_COMMUNITY): Payer: Self-pay

## 2021-07-25 ENCOUNTER — Other Ambulatory Visit (HOSPITAL_COMMUNITY): Payer: Self-pay

## 2021-07-28 ENCOUNTER — Other Ambulatory Visit (HOSPITAL_COMMUNITY): Payer: Self-pay

## 2021-08-06 ENCOUNTER — Other Ambulatory Visit (HOSPITAL_COMMUNITY): Payer: Self-pay

## 2021-08-09 ENCOUNTER — Other Ambulatory Visit (HOSPITAL_COMMUNITY): Payer: Self-pay

## 2021-08-10 ENCOUNTER — Other Ambulatory Visit (HOSPITAL_COMMUNITY): Payer: Self-pay

## 2021-08-16 ENCOUNTER — Other Ambulatory Visit (HOSPITAL_COMMUNITY): Payer: Self-pay

## 2021-08-17 ENCOUNTER — Other Ambulatory Visit (HOSPITAL_COMMUNITY): Payer: Self-pay

## 2021-08-22 ENCOUNTER — Other Ambulatory Visit (HOSPITAL_COMMUNITY): Payer: Self-pay

## 2021-08-23 ENCOUNTER — Other Ambulatory Visit (HOSPITAL_COMMUNITY): Payer: Self-pay

## 2021-08-30 ENCOUNTER — Other Ambulatory Visit (HOSPITAL_COMMUNITY): Payer: Self-pay

## 2021-09-07 ENCOUNTER — Other Ambulatory Visit (HOSPITAL_COMMUNITY): Payer: Self-pay

## 2021-09-12 ENCOUNTER — Other Ambulatory Visit (HOSPITAL_COMMUNITY): Payer: Self-pay

## 2021-09-12 DIAGNOSIS — F900 Attention-deficit hyperactivity disorder, predominantly inattentive type: Secondary | ICD-10-CM | POA: Diagnosis not present

## 2021-09-12 DIAGNOSIS — G4733 Obstructive sleep apnea (adult) (pediatric): Secondary | ICD-10-CM | POA: Diagnosis not present

## 2021-09-12 DIAGNOSIS — Z1159 Encounter for screening for other viral diseases: Secondary | ICD-10-CM | POA: Diagnosis not present

## 2021-09-12 DIAGNOSIS — E7800 Pure hypercholesterolemia, unspecified: Secondary | ICD-10-CM | POA: Diagnosis not present

## 2021-09-12 DIAGNOSIS — F419 Anxiety disorder, unspecified: Secondary | ICD-10-CM | POA: Diagnosis not present

## 2021-09-12 DIAGNOSIS — F3181 Bipolar II disorder: Secondary | ICD-10-CM | POA: Diagnosis not present

## 2021-09-12 DIAGNOSIS — I10 Essential (primary) hypertension: Secondary | ICD-10-CM | POA: Diagnosis not present

## 2021-09-12 DIAGNOSIS — G4700 Insomnia, unspecified: Secondary | ICD-10-CM | POA: Diagnosis not present

## 2021-09-12 DIAGNOSIS — J309 Allergic rhinitis, unspecified: Secondary | ICD-10-CM | POA: Diagnosis not present

## 2021-09-12 DIAGNOSIS — Z23 Encounter for immunization: Secondary | ICD-10-CM | POA: Diagnosis not present

## 2021-09-12 MED ORDER — LITHIUM CARBONATE 150 MG PO CAPS: ORAL_CAPSULE | ORAL | 1 refills | Status: AC

## 2021-09-12 MED ORDER — BUPROPION HCL ER (XL) 300 MG PO TB24
ORAL_TABLET | ORAL | 1 refills | Status: AC
  Filled 2021-12-22: qty 90, 90d supply, fill #0

## 2021-09-12 MED ORDER — OXCARBAZEPINE 300 MG PO TABS
ORAL_TABLET | ORAL | 1 refills | Status: DC
  Filled 2021-09-12: qty 220, 30d supply, fill #0
  Filled 2021-11-28: qty 220, 30d supply, fill #1

## 2021-09-12 MED ORDER — METOPROLOL SUCCINATE ER 100 MG PO TB24
ORAL_TABLET | ORAL | 1 refills | Status: DC
  Filled 2021-11-20: qty 90, 90d supply, fill #0
  Filled 2022-02-20: qty 90, 90d supply, fill #1

## 2021-09-12 MED ORDER — AMLODIPINE BESYLATE 5 MG PO TABS
ORAL_TABLET | ORAL | 1 refills | Status: DC
  Filled 2021-09-12 – 2021-11-13 (×2): qty 90, 90d supply, fill #0
  Filled 2022-02-08: qty 90, 90d supply, fill #1

## 2021-09-12 MED ORDER — ZALEPLON 10 MG PO CAPS: ORAL_CAPSULE | ORAL | 1 refills | Status: AC

## 2021-09-12 MED ORDER — VYVANSE 60 MG PO CAPS: ORAL_CAPSULE | ORAL | 0 refills | Status: AC

## 2021-09-12 MED ORDER — ROSUVASTATIN CALCIUM 5 MG PO TABS: ORAL_TABLET | ORAL | 1 refills | Status: AC

## 2021-09-12 MED ORDER — LAMOTRIGINE 100 MG PO TABS
ORAL_TABLET | ORAL | 1 refills | Status: DC
  Filled 2021-11-13: qty 90, 90d supply, fill #0
  Filled 2022-02-08: qty 90, 90d supply, fill #1

## 2021-09-27 ENCOUNTER — Other Ambulatory Visit (HOSPITAL_COMMUNITY): Payer: Self-pay

## 2021-09-27 MED ORDER — VYVANSE 60 MG PO CAPS
ORAL_CAPSULE | ORAL | 0 refills | Status: AC
  Filled 2021-09-27 – 2021-09-29 (×2): qty 90, 90d supply, fill #0

## 2021-09-29 ENCOUNTER — Other Ambulatory Visit (HOSPITAL_COMMUNITY): Payer: Self-pay

## 2021-09-30 ENCOUNTER — Other Ambulatory Visit (HOSPITAL_COMMUNITY): Payer: Self-pay

## 2021-11-03 ENCOUNTER — Other Ambulatory Visit (HOSPITAL_COMMUNITY): Payer: Self-pay

## 2021-11-04 ENCOUNTER — Other Ambulatory Visit (HOSPITAL_COMMUNITY): Payer: Self-pay

## 2021-11-04 MED ORDER — LITHIUM CARBONATE 150 MG PO CAPS
ORAL_CAPSULE | Freq: Every day | ORAL | 0 refills | Status: DC
  Filled 2021-11-04: qty 90, 90d supply, fill #0

## 2021-11-14 ENCOUNTER — Other Ambulatory Visit (HOSPITAL_COMMUNITY): Payer: Self-pay

## 2021-11-21 ENCOUNTER — Other Ambulatory Visit (HOSPITAL_COMMUNITY): Payer: Self-pay

## 2021-11-22 ENCOUNTER — Other Ambulatory Visit (HOSPITAL_COMMUNITY): Payer: Self-pay

## 2021-11-22 DIAGNOSIS — R7303 Prediabetes: Secondary | ICD-10-CM | POA: Diagnosis not present

## 2021-11-22 DIAGNOSIS — I10 Essential (primary) hypertension: Secondary | ICD-10-CM | POA: Diagnosis not present

## 2021-11-22 MED ORDER — WEGOVY 2.4 MG/0.75ML ~~LOC~~ SOAJ
SUBCUTANEOUS | 3 refills | Status: AC
  Filled 2021-11-22: qty 9, 84d supply, fill #0
  Filled 2022-02-27: qty 9, 84d supply, fill #1
  Filled 2022-09-22: qty 3, 28d supply, fill #2

## 2021-11-23 ENCOUNTER — Other Ambulatory Visit (HOSPITAL_COMMUNITY): Payer: Self-pay

## 2021-11-28 ENCOUNTER — Other Ambulatory Visit (HOSPITAL_COMMUNITY): Payer: Self-pay

## 2021-12-18 ENCOUNTER — Other Ambulatory Visit (HOSPITAL_COMMUNITY): Payer: Self-pay

## 2021-12-20 ENCOUNTER — Other Ambulatory Visit (HOSPITAL_COMMUNITY): Payer: Self-pay

## 2021-12-20 MED ORDER — ZALEPLON 10 MG PO CAPS
ORAL_CAPSULE | ORAL | 1 refills | Status: AC
  Filled 2022-01-11: qty 180, 90d supply, fill #0
  Filled 2022-05-08: qty 60, 30d supply, fill #1
  Filled 2022-05-17: qty 180, 90d supply, fill #1

## 2021-12-23 ENCOUNTER — Other Ambulatory Visit (HOSPITAL_COMMUNITY): Payer: Self-pay

## 2022-01-08 ENCOUNTER — Other Ambulatory Visit (HOSPITAL_COMMUNITY): Payer: Self-pay

## 2022-01-11 ENCOUNTER — Other Ambulatory Visit (HOSPITAL_COMMUNITY): Payer: Self-pay

## 2022-01-11 MED ORDER — VYVANSE 60 MG PO CAPS
ORAL_CAPSULE | ORAL | 0 refills | Status: DC
  Filled 2022-01-11: qty 90, 90d supply, fill #0

## 2022-01-12 ENCOUNTER — Other Ambulatory Visit (HOSPITAL_COMMUNITY): Payer: Self-pay

## 2022-01-13 ENCOUNTER — Other Ambulatory Visit (HOSPITAL_COMMUNITY): Payer: Self-pay

## 2022-01-21 ENCOUNTER — Other Ambulatory Visit (HOSPITAL_COMMUNITY): Payer: Self-pay

## 2022-01-23 ENCOUNTER — Other Ambulatory Visit (HOSPITAL_COMMUNITY): Payer: Self-pay

## 2022-01-23 MED ORDER — LITHIUM CARBONATE 150 MG PO CAPS
150.0000 mg | ORAL_CAPSULE | Freq: Every day | ORAL | 0 refills | Status: AC
  Filled 2022-01-23: qty 90, 90d supply, fill #0

## 2022-01-23 MED ORDER — OXCARBAZEPINE 300 MG PO TABS
ORAL_TABLET | ORAL | 1 refills | Status: AC
  Filled 2022-01-23: qty 210, 30d supply, fill #0
  Filled 2022-03-15: qty 210, 30d supply, fill #1

## 2022-01-25 ENCOUNTER — Other Ambulatory Visit (HOSPITAL_COMMUNITY): Payer: Self-pay

## 2022-02-09 ENCOUNTER — Other Ambulatory Visit (HOSPITAL_COMMUNITY): Payer: Self-pay

## 2022-02-20 ENCOUNTER — Other Ambulatory Visit (HOSPITAL_COMMUNITY): Payer: Self-pay

## 2022-02-27 ENCOUNTER — Other Ambulatory Visit (HOSPITAL_COMMUNITY): Payer: Self-pay

## 2022-03-10 ENCOUNTER — Other Ambulatory Visit (HOSPITAL_COMMUNITY): Payer: Self-pay

## 2022-03-10 DIAGNOSIS — J069 Acute upper respiratory infection, unspecified: Secondary | ICD-10-CM | POA: Diagnosis not present

## 2022-03-10 DIAGNOSIS — J029 Acute pharyngitis, unspecified: Secondary | ICD-10-CM | POA: Diagnosis not present

## 2022-03-10 DIAGNOSIS — H903 Sensorineural hearing loss, bilateral: Secondary | ICD-10-CM | POA: Diagnosis not present

## 2022-03-10 DIAGNOSIS — Z45321 Encounter for adjustment and management of cochlear device: Secondary | ICD-10-CM | POA: Diagnosis not present

## 2022-03-10 DIAGNOSIS — Z9621 Cochlear implant status: Secondary | ICD-10-CM | POA: Diagnosis not present

## 2022-03-10 MED ORDER — AZITHROMYCIN 500 MG PO TABS
ORAL_TABLET | ORAL | 0 refills | Status: AC
  Filled 2022-03-10: qty 5, 5d supply, fill #0

## 2022-03-16 ENCOUNTER — Other Ambulatory Visit (HOSPITAL_COMMUNITY): Payer: Self-pay

## 2022-03-20 DIAGNOSIS — J309 Allergic rhinitis, unspecified: Secondary | ICD-10-CM | POA: Diagnosis not present

## 2022-03-20 DIAGNOSIS — Z1211 Encounter for screening for malignant neoplasm of colon: Secondary | ICD-10-CM | POA: Diagnosis not present

## 2022-03-20 DIAGNOSIS — F900 Attention-deficit hyperactivity disorder, predominantly inattentive type: Secondary | ICD-10-CM | POA: Diagnosis not present

## 2022-03-20 DIAGNOSIS — F3181 Bipolar II disorder: Secondary | ICD-10-CM | POA: Diagnosis not present

## 2022-03-20 DIAGNOSIS — I10 Essential (primary) hypertension: Secondary | ICD-10-CM | POA: Diagnosis not present

## 2022-03-20 DIAGNOSIS — E7800 Pure hypercholesterolemia, unspecified: Secondary | ICD-10-CM | POA: Diagnosis not present

## 2022-03-20 DIAGNOSIS — G4733 Obstructive sleep apnea (adult) (pediatric): Secondary | ICD-10-CM | POA: Diagnosis not present

## 2022-03-20 DIAGNOSIS — F419 Anxiety disorder, unspecified: Secondary | ICD-10-CM | POA: Diagnosis not present

## 2022-03-20 DIAGNOSIS — G4700 Insomnia, unspecified: Secondary | ICD-10-CM | POA: Diagnosis not present

## 2022-03-21 ENCOUNTER — Other Ambulatory Visit (HOSPITAL_COMMUNITY): Payer: Self-pay

## 2022-03-21 MED ORDER — METOPROLOL SUCCINATE ER 100 MG PO TB24
ORAL_TABLET | ORAL | 1 refills | Status: DC
  Filled 2022-05-22: qty 90, 90d supply, fill #0
  Filled 2022-08-15: qty 90, 90d supply, fill #1

## 2022-03-21 MED ORDER — LITHIUM CARBONATE 150 MG PO CAPS
ORAL_CAPSULE | ORAL | 1 refills | Status: DC
  Filled 2022-03-21 – 2022-05-09 (×2): qty 90, 90d supply, fill #0
  Filled 2022-08-02: qty 90, 90d supply, fill #1

## 2022-03-21 MED ORDER — ZALEPLON 10 MG PO CAPS: ORAL_CAPSULE | ORAL | 1 refills | Status: AC

## 2022-03-21 MED ORDER — AMLODIPINE BESYLATE 5 MG PO TABS
ORAL_TABLET | ORAL | 1 refills | Status: DC
  Filled 2022-03-21 – 2022-05-08 (×2): qty 90, 90d supply, fill #0
  Filled 2022-08-15: qty 90, 90d supply, fill #1

## 2022-03-21 MED ORDER — VYVANSE 60 MG PO CAPS
ORAL_CAPSULE | ORAL | 0 refills | Status: AC
  Filled 2022-04-28: qty 90, 90d supply, fill #0

## 2022-03-21 MED ORDER — OXCARBAZEPINE 300 MG PO TABS
ORAL_TABLET | ORAL | 1 refills | Status: DC
  Filled 2022-04-12: qty 210, 30d supply, fill #0
  Filled 2022-05-09: qty 210, 30d supply, fill #1

## 2022-03-21 MED ORDER — ROSUVASTATIN CALCIUM 5 MG PO TABS
ORAL_TABLET | ORAL | 1 refills | Status: DC
Start: 2022-03-21 — End: 2022-10-03
  Filled 2022-03-21: qty 90, 90d supply, fill #0
  Filled 2022-08-11: qty 90, 90d supply, fill #1

## 2022-03-21 MED ORDER — LAMOTRIGINE 100 MG PO TABS
ORAL_TABLET | ORAL | 1 refills | Status: DC
  Filled 2022-05-08: qty 90, 90d supply, fill #0
  Filled 2022-08-02: qty 90, 90d supply, fill #1

## 2022-03-21 MED ORDER — BUPROPION HCL ER (XL) 300 MG PO TB24
ORAL_TABLET | ORAL | 1 refills | Status: DC
Start: 2022-03-21 — End: 2022-10-03
  Filled 2022-03-21: qty 90, 90d supply, fill #0
  Filled 2022-06-28: qty 90, 90d supply, fill #1

## 2022-03-22 ENCOUNTER — Other Ambulatory Visit (HOSPITAL_COMMUNITY): Payer: Self-pay

## 2022-04-04 DIAGNOSIS — Z1231 Encounter for screening mammogram for malignant neoplasm of breast: Secondary | ICD-10-CM

## 2022-04-12 ENCOUNTER — Other Ambulatory Visit (HOSPITAL_COMMUNITY): Payer: Self-pay

## 2022-04-25 ENCOUNTER — Other Ambulatory Visit: Payer: Self-pay | Admitting: Family Medicine

## 2022-04-25 ENCOUNTER — Other Ambulatory Visit (HOSPITAL_COMMUNITY): Payer: Self-pay

## 2022-04-25 DIAGNOSIS — Z1231 Encounter for screening mammogram for malignant neoplasm of breast: Secondary | ICD-10-CM

## 2022-04-27 ENCOUNTER — Ambulatory Visit
Admission: RE | Admit: 2022-04-27 | Discharge: 2022-04-27 | Disposition: A | Discharge status: Home / Self Care | Payer: 59 | Source: Ambulatory Visit

## 2022-04-27 DIAGNOSIS — Z1231 Encounter for screening mammogram for malignant neoplasm of breast: Secondary | ICD-10-CM | POA: Diagnosis not present

## 2022-04-28 ENCOUNTER — Other Ambulatory Visit (HOSPITAL_COMMUNITY): Payer: Self-pay

## 2022-05-03 ENCOUNTER — Other Ambulatory Visit: Payer: Self-pay | Admitting: Family Medicine

## 2022-05-03 DIAGNOSIS — R928 Other abnormal and inconclusive findings on diagnostic imaging of breast: Secondary | ICD-10-CM

## 2022-05-08 ENCOUNTER — Other Ambulatory Visit (HOSPITAL_COMMUNITY): Payer: Self-pay

## 2022-05-09 ENCOUNTER — Other Ambulatory Visit (HOSPITAL_COMMUNITY): Payer: Self-pay

## 2022-05-10 ENCOUNTER — Other Ambulatory Visit (HOSPITAL_COMMUNITY): Payer: Self-pay

## 2022-05-16 ENCOUNTER — Other Ambulatory Visit (HOSPITAL_COMMUNITY): Payer: Self-pay

## 2022-05-16 DIAGNOSIS — Z1231 Encounter for screening mammogram for malignant neoplasm of breast: Secondary | ICD-10-CM

## 2022-05-17 ENCOUNTER — Other Ambulatory Visit (HOSPITAL_COMMUNITY): Payer: Self-pay

## 2022-05-18 ENCOUNTER — Other Ambulatory Visit (HOSPITAL_COMMUNITY): Payer: Self-pay

## 2022-05-18 MED ORDER — ZALEPLON 10 MG PO CAPS
ORAL_CAPSULE | ORAL | 0 refills | Status: DC
  Filled 2022-05-18 – 2022-10-01 (×2): qty 60, 30d supply, fill #0

## 2022-05-22 ENCOUNTER — Other Ambulatory Visit (HOSPITAL_COMMUNITY): Payer: Self-pay

## 2022-05-23 ENCOUNTER — Ambulatory Visit
Admission: RE | Admit: 2022-05-23 | Discharge: 2022-05-23 | Disposition: A | Discharge status: Home / Self Care | Payer: 59 | Source: Ambulatory Visit | Attending: Family Medicine | Admitting: Family Medicine

## 2022-05-23 ENCOUNTER — Other Ambulatory Visit: Payer: Self-pay | Admitting: Family Medicine

## 2022-05-23 ENCOUNTER — Other Ambulatory Visit (HOSPITAL_COMMUNITY): Payer: Self-pay

## 2022-05-23 DIAGNOSIS — N6011 Diffuse cystic mastopathy of right breast: Secondary | ICD-10-CM | POA: Diagnosis not present

## 2022-05-23 DIAGNOSIS — I10 Essential (primary) hypertension: Secondary | ICD-10-CM | POA: Diagnosis not present

## 2022-05-23 DIAGNOSIS — N6310 Unspecified lump in the right breast, unspecified quadrant: Secondary | ICD-10-CM

## 2022-05-23 DIAGNOSIS — R928 Other abnormal and inconclusive findings on diagnostic imaging of breast: Secondary | ICD-10-CM

## 2022-05-23 MED ORDER — WEGOVY 2.4 MG/0.75ML ~~LOC~~ SOAJ
SUBCUTANEOUS | 11 refills | Status: AC
  Filled 2022-05-23: qty 3, 28d supply, fill #0
  Filled 2022-07-03: qty 3, 28d supply, fill #1
  Filled 2022-08-21: qty 3, 28d supply, fill #2
  Filled 2022-10-27: qty 3, 28d supply, fill #3

## 2022-06-06 ENCOUNTER — Encounter (HOSPITAL_COMMUNITY): Payer: Self-pay | Admitting: Pharmacist

## 2022-06-06 ENCOUNTER — Other Ambulatory Visit (HOSPITAL_COMMUNITY): Payer: Self-pay

## 2022-06-21 ENCOUNTER — Other Ambulatory Visit (HOSPITAL_COMMUNITY): Payer: Self-pay

## 2022-06-24 DIAGNOSIS — F319 Bipolar disorder, unspecified: Secondary | ICD-10-CM | POA: Diagnosis not present

## 2022-06-24 DIAGNOSIS — R479 Unspecified speech disturbances: Secondary | ICD-10-CM | POA: Diagnosis not present

## 2022-06-24 DIAGNOSIS — R29818 Other symptoms and signs involving the nervous system: Secondary | ICD-10-CM | POA: Diagnosis not present

## 2022-06-24 DIAGNOSIS — G9341 Metabolic encephalopathy: Secondary | ICD-10-CM | POA: Diagnosis not present

## 2022-06-24 DIAGNOSIS — R262 Difficulty in walking, not elsewhere classified: Secondary | ICD-10-CM | POA: Diagnosis not present

## 2022-06-24 DIAGNOSIS — R4701 Aphasia: Secondary | ICD-10-CM | POA: Diagnosis not present

## 2022-06-24 DIAGNOSIS — Z0189 Encounter for other specified special examinations: Secondary | ICD-10-CM | POA: Diagnosis not present

## 2022-06-24 DIAGNOSIS — R531 Weakness: Secondary | ICD-10-CM | POA: Diagnosis not present

## 2022-06-24 DIAGNOSIS — Z743 Need for continuous supervision: Secondary | ICD-10-CM | POA: Diagnosis not present

## 2022-06-24 DIAGNOSIS — E785 Hyperlipidemia, unspecified: Secondary | ICD-10-CM | POA: Diagnosis not present

## 2022-06-24 DIAGNOSIS — I10 Essential (primary) hypertension: Secondary | ICD-10-CM | POA: Diagnosis not present

## 2022-06-24 DIAGNOSIS — Z79899 Other long term (current) drug therapy: Secondary | ICD-10-CM | POA: Diagnosis not present

## 2022-06-24 DIAGNOSIS — G928 Other toxic encephalopathy: Secondary | ICD-10-CM | POA: Diagnosis not present

## 2022-06-24 DIAGNOSIS — T50901A Poisoning by unspecified drugs, medicaments and biological substances, accidental (unintentional), initial encounter: Secondary | ICD-10-CM | POA: Diagnosis not present

## 2022-06-28 ENCOUNTER — Other Ambulatory Visit (HOSPITAL_COMMUNITY): Payer: Self-pay

## 2022-06-30 ENCOUNTER — Other Ambulatory Visit (HOSPITAL_COMMUNITY): Payer: Self-pay

## 2022-06-30 MED ORDER — OXCARBAZEPINE 300 MG PO TABS
ORAL_TABLET | ORAL | 0 refills | Status: DC
  Filled 2022-06-30: qty 210, 30d supply, fill #0

## 2022-07-03 ENCOUNTER — Other Ambulatory Visit (HOSPITAL_COMMUNITY): Payer: Self-pay

## 2022-07-05 ENCOUNTER — Other Ambulatory Visit (HOSPITAL_COMMUNITY): Payer: Self-pay

## 2022-07-06 ENCOUNTER — Other Ambulatory Visit (HOSPITAL_COMMUNITY): Payer: Self-pay

## 2022-08-02 ENCOUNTER — Other Ambulatory Visit (HOSPITAL_COMMUNITY): Payer: Self-pay

## 2022-08-03 ENCOUNTER — Other Ambulatory Visit (HOSPITAL_COMMUNITY): Payer: Self-pay

## 2022-08-03 MED ORDER — LISDEXAMFETAMINE DIMESYLATE 60 MG PO CAPS
60.0000 mg | ORAL_CAPSULE | ORAL | 0 refills | Status: AC
  Filled 2022-08-03: qty 90, 90d supply, fill #0

## 2022-08-08 DIAGNOSIS — Z0000 Encounter for general adult medical examination without abnormal findings: Secondary | ICD-10-CM | POA: Diagnosis not present

## 2022-08-08 DIAGNOSIS — F900 Attention-deficit hyperactivity disorder, predominantly inattentive type: Secondary | ICD-10-CM | POA: Diagnosis not present

## 2022-08-08 DIAGNOSIS — Z23 Encounter for immunization: Secondary | ICD-10-CM | POA: Diagnosis not present

## 2022-08-08 DIAGNOSIS — E7800 Pure hypercholesterolemia, unspecified: Secondary | ICD-10-CM | POA: Diagnosis not present

## 2022-08-08 DIAGNOSIS — F3181 Bipolar II disorder: Secondary | ICD-10-CM | POA: Diagnosis not present

## 2022-08-08 DIAGNOSIS — Z1211 Encounter for screening for malignant neoplasm of colon: Secondary | ICD-10-CM | POA: Diagnosis not present

## 2022-08-08 DIAGNOSIS — I10 Essential (primary) hypertension: Secondary | ICD-10-CM | POA: Diagnosis not present

## 2022-08-11 ENCOUNTER — Other Ambulatory Visit (HOSPITAL_COMMUNITY): Payer: Self-pay

## 2022-08-14 ENCOUNTER — Other Ambulatory Visit (HOSPITAL_COMMUNITY): Payer: Self-pay

## 2022-08-15 ENCOUNTER — Other Ambulatory Visit (HOSPITAL_COMMUNITY): Payer: Self-pay

## 2022-08-21 ENCOUNTER — Other Ambulatory Visit (HOSPITAL_COMMUNITY): Payer: Self-pay

## 2022-08-30 ENCOUNTER — Other Ambulatory Visit (HOSPITAL_COMMUNITY): Payer: Self-pay

## 2022-08-31 DIAGNOSIS — L255 Unspecified contact dermatitis due to plants, except food: Secondary | ICD-10-CM | POA: Diagnosis not present

## 2022-09-23 ENCOUNTER — Other Ambulatory Visit (HOSPITAL_COMMUNITY): Payer: Self-pay

## 2022-09-26 ENCOUNTER — Other Ambulatory Visit (HOSPITAL_COMMUNITY): Payer: Self-pay

## 2022-10-01 ENCOUNTER — Other Ambulatory Visit (HOSPITAL_COMMUNITY): Payer: Self-pay

## 2022-10-02 ENCOUNTER — Other Ambulatory Visit (HOSPITAL_COMMUNITY): Payer: Self-pay

## 2022-10-03 ENCOUNTER — Other Ambulatory Visit (HOSPITAL_COMMUNITY): Payer: Self-pay

## 2022-10-03 MED ORDER — METOPROLOL SUCCINATE ER 100 MG PO TB24
100.0000 mg | ORAL_TABLET | Freq: Every morning | ORAL | 1 refills | Status: AC
  Filled 2022-10-03 – 2022-11-30 (×2): qty 90, 90d supply, fill #0

## 2022-10-03 MED ORDER — AMLODIPINE BESYLATE 5 MG PO TABS
5.0000 mg | ORAL_TABLET | Freq: Every morning | ORAL | 1 refills | Status: AC
  Filled 2022-10-03 – 2022-11-20 (×2): qty 90, 90d supply, fill #0

## 2022-10-03 MED ORDER — OXCARBAZEPINE 300 MG PO TABS
ORAL_TABLET | ORAL | 1 refills | Status: AC
  Filled 2022-10-03: qty 210, 30d supply, fill #0

## 2022-10-03 MED ORDER — LITHIUM CARBONATE 150 MG PO CAPS
150.0000 mg | ORAL_CAPSULE | Freq: Every day | ORAL | 1 refills | Status: DC
  Filled 2022-10-03 – 2022-10-20 (×2): qty 90, 90d supply, fill #0
  Filled 2023-02-01: qty 90, 90d supply, fill #1

## 2022-10-03 MED ORDER — LAMOTRIGINE 100 MG PO TABS
100.0000 mg | ORAL_TABLET | Freq: Every day | ORAL | 1 refills | Status: DC
  Filled 2022-10-03 – 2022-10-20 (×2): qty 90, 90d supply, fill #0
  Filled 2023-02-01: qty 90, 90d supply, fill #1

## 2022-10-03 MED ORDER — BUPROPION HCL ER (XL) 300 MG PO TB24
300.0000 mg | ORAL_TABLET | Freq: Every morning | ORAL | 1 refills | Status: DC
  Filled 2022-10-03: qty 90, 90d supply, fill #0
  Filled 2023-01-14: qty 90, 90d supply, fill #1

## 2022-10-03 MED ORDER — ROSUVASTATIN CALCIUM 5 MG PO TABS
5.0000 mg | ORAL_TABLET | Freq: Every day | ORAL | 1 refills | Status: DC
  Filled 2022-10-03 – 2022-11-20 (×2): qty 90, 90d supply, fill #0
  Filled 2023-02-07: qty 90, 90d supply, fill #1

## 2022-10-10 ENCOUNTER — Other Ambulatory Visit (HOSPITAL_COMMUNITY): Payer: Self-pay

## 2022-10-10 MED ORDER — ZALEPLON 10 MG PO CAPS
10.0000 mg | ORAL_CAPSULE | Freq: Every evening | ORAL | 2 refills | Status: AC | PRN
  Filled 2022-11-20: qty 60, 30d supply, fill #0

## 2022-10-10 MED ORDER — OXCARBAZEPINE 300 MG PO TABS
ORAL_TABLET | ORAL | 2 refills | Status: AC
  Filled 2022-12-04: qty 210, 30d supply, fill #0
  Filled 2023-01-04: qty 210, 30d supply, fill #1
  Filled 2023-03-30: qty 210, 30d supply, fill #2

## 2022-10-20 ENCOUNTER — Other Ambulatory Visit (HOSPITAL_COMMUNITY): Payer: Self-pay

## 2022-10-27 ENCOUNTER — Other Ambulatory Visit (HOSPITAL_COMMUNITY): Payer: Self-pay

## 2022-10-31 ENCOUNTER — Other Ambulatory Visit (HOSPITAL_COMMUNITY): Payer: Self-pay

## 2022-11-03 ENCOUNTER — Other Ambulatory Visit (HOSPITAL_COMMUNITY): Payer: Self-pay

## 2022-11-20 ENCOUNTER — Other Ambulatory Visit (HOSPITAL_COMMUNITY): Payer: Self-pay

## 2022-11-21 ENCOUNTER — Encounter (HOSPITAL_COMMUNITY): Payer: Self-pay

## 2022-11-21 ENCOUNTER — Other Ambulatory Visit: Payer: Self-pay

## 2022-11-21 ENCOUNTER — Other Ambulatory Visit (HOSPITAL_COMMUNITY): Payer: Self-pay

## 2022-11-21 MED ORDER — LISDEXAMFETAMINE DIMESYLATE 60 MG PO CAPS
60.0000 mg | ORAL_CAPSULE | Freq: Every morning | ORAL | 0 refills | Status: AC
  Filled 2022-11-21: qty 90, 90d supply, fill #0

## 2022-11-22 ENCOUNTER — Other Ambulatory Visit (HOSPITAL_COMMUNITY): Payer: Self-pay

## 2022-11-22 ENCOUNTER — Other Ambulatory Visit: Payer: Self-pay

## 2022-11-24 ENCOUNTER — Other Ambulatory Visit: Payer: Self-pay | Admitting: Family Medicine

## 2022-11-24 ENCOUNTER — Ambulatory Visit
Admission: RE | Admit: 2022-11-24 | Discharge: 2022-11-24 | Disposition: A | Discharge status: Home / Self Care | Payer: Commercial Managed Care - PPO | Source: Ambulatory Visit | Attending: Family Medicine | Admitting: Family Medicine

## 2022-11-24 DIAGNOSIS — N6310 Unspecified lump in the right breast, unspecified quadrant: Secondary | ICD-10-CM

## 2022-11-24 DIAGNOSIS — N6315 Unspecified lump in the right breast, overlapping quadrants: Secondary | ICD-10-CM | POA: Diagnosis not present

## 2022-11-28 ENCOUNTER — Other Ambulatory Visit (HOSPITAL_COMMUNITY): Payer: Self-pay

## 2022-11-28 DIAGNOSIS — I10 Essential (primary) hypertension: Secondary | ICD-10-CM | POA: Diagnosis not present

## 2022-11-28 MED ORDER — WEGOVY 2.4 MG/0.75ML ~~LOC~~ SOAJ
2.4000 mg | SUBCUTANEOUS | 3 refills | Status: AC
  Filled 2022-11-28: qty 3, 28d supply, fill #0
  Filled 2022-12-21: qty 3, 28d supply, fill #1
  Filled 2023-01-05 – 2023-02-19 (×2): qty 3, 28d supply, fill #2
  Filled 2023-03-15 – 2023-04-23 (×2): qty 3, 28d supply, fill #3

## 2022-12-01 ENCOUNTER — Other Ambulatory Visit: Payer: Self-pay

## 2022-12-05 ENCOUNTER — Other Ambulatory Visit: Payer: Self-pay

## 2022-12-19 ENCOUNTER — Other Ambulatory Visit (HOSPITAL_COMMUNITY): Payer: Self-pay

## 2022-12-22 ENCOUNTER — Other Ambulatory Visit: Payer: Self-pay

## 2023-01-04 ENCOUNTER — Other Ambulatory Visit (HOSPITAL_COMMUNITY): Payer: Self-pay

## 2023-01-05 ENCOUNTER — Other Ambulatory Visit (HOSPITAL_COMMUNITY): Payer: Self-pay

## 2023-01-05 ENCOUNTER — Other Ambulatory Visit: Payer: Self-pay

## 2023-01-06 ENCOUNTER — Other Ambulatory Visit (HOSPITAL_COMMUNITY): Payer: Self-pay

## 2023-01-06 MED ORDER — ZALEPLON 10 MG PO CAPS
10.0000 mg | ORAL_CAPSULE | Freq: Every day | ORAL | 1 refills | Status: AC
  Filled 2023-01-06: qty 60, 30d supply, fill #0

## 2023-01-09 ENCOUNTER — Other Ambulatory Visit (HOSPITAL_COMMUNITY): Payer: Self-pay

## 2023-01-15 ENCOUNTER — Other Ambulatory Visit: Payer: Self-pay

## 2023-02-01 ENCOUNTER — Other Ambulatory Visit: Payer: Self-pay

## 2023-02-07 ENCOUNTER — Other Ambulatory Visit (HOSPITAL_COMMUNITY): Payer: Self-pay

## 2023-02-12 ENCOUNTER — Other Ambulatory Visit (HOSPITAL_COMMUNITY): Payer: Self-pay

## 2023-02-12 ENCOUNTER — Other Ambulatory Visit: Payer: Self-pay

## 2023-02-12 DIAGNOSIS — I10 Essential (primary) hypertension: Secondary | ICD-10-CM | POA: Diagnosis not present

## 2023-02-12 DIAGNOSIS — F3181 Bipolar II disorder: Secondary | ICD-10-CM | POA: Diagnosis not present

## 2023-02-12 DIAGNOSIS — E7800 Pure hypercholesterolemia, unspecified: Secondary | ICD-10-CM | POA: Diagnosis not present

## 2023-02-12 MED ORDER — AMLODIPINE BESYLATE 5 MG PO TABS
5.0000 mg | ORAL_TABLET | Freq: Every morning | ORAL | 1 refills | Status: AC
  Filled 2023-02-12: qty 90, 90d supply, fill #0
  Filled 2023-06-09: qty 90, 90d supply, fill #1

## 2023-02-12 MED ORDER — METOPROLOL SUCCINATE ER 100 MG PO TB24
100.0000 mg | ORAL_TABLET | Freq: Every morning | ORAL | 1 refills | Status: AC
  Filled 2023-02-12: qty 90, 90d supply, fill #0
  Filled 2023-06-09: qty 90, 90d supply, fill #1

## 2023-02-12 MED ORDER — ROSUVASTATIN CALCIUM 5 MG PO TABS
5.0000 mg | ORAL_TABLET | Freq: Every day | ORAL | 1 refills | Status: DC
  Filled 2023-02-12 – 2023-02-21 (×3): qty 90, 90d supply, fill #0
  Filled 2023-05-16: qty 90, 90d supply, fill #1

## 2023-02-17 ENCOUNTER — Other Ambulatory Visit (HOSPITAL_COMMUNITY): Payer: Self-pay

## 2023-02-19 ENCOUNTER — Other Ambulatory Visit (HOSPITAL_COMMUNITY): Payer: Self-pay

## 2023-02-20 ENCOUNTER — Other Ambulatory Visit (HOSPITAL_COMMUNITY): Payer: Self-pay

## 2023-02-20 DIAGNOSIS — F900 Attention-deficit hyperactivity disorder, predominantly inattentive type: Secondary | ICD-10-CM | POA: Diagnosis not present

## 2023-02-20 DIAGNOSIS — F3176 Bipolar disorder, in full remission, most recent episode depressed: Secondary | ICD-10-CM | POA: Diagnosis not present

## 2023-02-20 DIAGNOSIS — F3174 Bipolar disorder, in full remission, most recent episode manic: Secondary | ICD-10-CM | POA: Diagnosis not present

## 2023-02-20 DIAGNOSIS — F5101 Primary insomnia: Secondary | ICD-10-CM | POA: Diagnosis not present

## 2023-02-20 MED ORDER — ZALEPLON 10 MG PO CAPS
20.0000 mg | ORAL_CAPSULE | Freq: Every day | ORAL | 0 refills | Status: AC
  Filled 2023-02-20: qty 64, 32d supply, fill #0
  Filled 2023-02-20: qty 116, 58d supply, fill #0
  Filled 2023-02-20: qty 110, 55d supply, fill #0
  Filled 2023-02-20: qty 116, 58d supply, fill #0

## 2023-02-20 MED ORDER — LISDEXAMFETAMINE DIMESYLATE 60 MG PO CAPS
60.0000 mg | ORAL_CAPSULE | Freq: Every morning | ORAL | 0 refills | Status: AC
  Filled 2023-02-20: qty 90, 90d supply, fill #0

## 2023-02-21 ENCOUNTER — Other Ambulatory Visit (HOSPITAL_COMMUNITY): Payer: Self-pay

## 2023-03-15 ENCOUNTER — Other Ambulatory Visit: Payer: Self-pay

## 2023-03-15 DIAGNOSIS — Z9621 Cochlear implant status: Secondary | ICD-10-CM | POA: Diagnosis not present

## 2023-03-15 DIAGNOSIS — Z45321 Encounter for adjustment and management of cochlear device: Secondary | ICD-10-CM | POA: Diagnosis not present

## 2023-03-15 DIAGNOSIS — Z4889 Encounter for other specified surgical aftercare: Secondary | ICD-10-CM | POA: Diagnosis not present

## 2023-03-15 DIAGNOSIS — H903 Sensorineural hearing loss, bilateral: Secondary | ICD-10-CM | POA: Diagnosis not present

## 2023-03-30 ENCOUNTER — Other Ambulatory Visit (HOSPITAL_COMMUNITY): Payer: Self-pay

## 2023-04-23 ENCOUNTER — Other Ambulatory Visit (HOSPITAL_COMMUNITY): Payer: Self-pay

## 2023-04-25 ENCOUNTER — Other Ambulatory Visit (HOSPITAL_BASED_OUTPATIENT_CLINIC_OR_DEPARTMENT_OTHER): Payer: Self-pay

## 2023-04-25 ENCOUNTER — Other Ambulatory Visit: Payer: Self-pay

## 2023-04-25 ENCOUNTER — Other Ambulatory Visit (HOSPITAL_COMMUNITY): Payer: Self-pay

## 2023-04-25 MED ORDER — BUPROPION HCL ER (XL) 300 MG PO TB24
300.0000 mg | ORAL_TABLET | Freq: Every morning | ORAL | 0 refills | Status: DC
  Filled 2023-04-25 – 2023-05-15 (×2): qty 90, 90d supply, fill #0

## 2023-04-25 MED ORDER — LITHIUM CARBONATE 150 MG PO CAPS
150.0000 mg | ORAL_CAPSULE | Freq: Every day | ORAL | 0 refills | Status: DC
  Filled 2023-04-25: qty 90, 90d supply, fill #0

## 2023-04-25 MED ORDER — LAMOTRIGINE 100 MG PO TABS
100.0000 mg | ORAL_TABLET | Freq: Every day | ORAL | 0 refills | Status: DC
  Filled 2023-04-25: qty 90, 90d supply, fill #0

## 2023-04-26 ENCOUNTER — Other Ambulatory Visit (HOSPITAL_COMMUNITY): Payer: Self-pay

## 2023-05-04 ENCOUNTER — Other Ambulatory Visit (HOSPITAL_COMMUNITY): Payer: Self-pay

## 2023-05-08 ENCOUNTER — Ambulatory Visit
Admission: RE | Admit: 2023-05-08 | Discharge: 2023-05-08 | Disposition: A | Discharge status: Home / Self Care | Payer: Commercial Managed Care - PPO | Source: Ambulatory Visit | Attending: Family Medicine | Admitting: Family Medicine

## 2023-05-08 DIAGNOSIS — N6310 Unspecified lump in the right breast, unspecified quadrant: Secondary | ICD-10-CM

## 2023-05-08 DIAGNOSIS — N6315 Unspecified lump in the right breast, overlapping quadrants: Secondary | ICD-10-CM | POA: Diagnosis not present

## 2023-05-15 ENCOUNTER — Other Ambulatory Visit (HOSPITAL_COMMUNITY): Payer: Self-pay

## 2023-05-16 ENCOUNTER — Other Ambulatory Visit (HOSPITAL_COMMUNITY): Payer: Self-pay

## 2023-05-16 MED ORDER — OXCARBAZEPINE 300 MG PO TABS
ORAL_TABLET | ORAL | 0 refills | Status: AC
  Filled 2023-05-16: qty 210, 30d supply, fill #0

## 2023-05-18 ENCOUNTER — Other Ambulatory Visit (HOSPITAL_COMMUNITY): Payer: Self-pay

## 2023-05-29 ENCOUNTER — Other Ambulatory Visit: Payer: Self-pay

## 2023-05-29 ENCOUNTER — Other Ambulatory Visit (HOSPITAL_COMMUNITY): Payer: Self-pay

## 2023-05-29 DIAGNOSIS — I10 Essential (primary) hypertension: Secondary | ICD-10-CM | POA: Diagnosis not present

## 2023-05-29 DIAGNOSIS — F5101 Primary insomnia: Secondary | ICD-10-CM | POA: Diagnosis not present

## 2023-05-29 DIAGNOSIS — F3176 Bipolar disorder, in full remission, most recent episode depressed: Secondary | ICD-10-CM | POA: Diagnosis not present

## 2023-05-29 DIAGNOSIS — F3174 Bipolar disorder, in full remission, most recent episode manic: Secondary | ICD-10-CM | POA: Diagnosis not present

## 2023-05-29 DIAGNOSIS — F900 Attention-deficit hyperactivity disorder, predominantly inattentive type: Secondary | ICD-10-CM | POA: Diagnosis not present

## 2023-05-29 MED ORDER — WEGOVY 2.4 MG/0.75ML ~~LOC~~ SOAJ
2.4000 mg | SUBCUTANEOUS | 5 refills | Status: AC
  Filled 2023-05-29: qty 3, 28d supply, fill #0
  Filled 2023-07-27: qty 3, 28d supply, fill #1
  Filled 2023-08-19: qty 3, 28d supply, fill #2
  Filled 2023-09-26 – 2024-03-19 (×2): qty 3, 28d supply, fill #3

## 2023-05-30 ENCOUNTER — Other Ambulatory Visit (HOSPITAL_COMMUNITY): Payer: Self-pay

## 2023-05-30 MED ORDER — ZALEPLON 10 MG PO CAPS
20.0000 mg | ORAL_CAPSULE | Freq: Every day | ORAL | 1 refills | Status: DC
  Filled 2023-05-30: qty 180, 90d supply, fill #0

## 2023-05-30 MED ORDER — OXCARBAZEPINE 300 MG PO TABS
ORAL_TABLET | ORAL | 3 refills | Status: AC
Start: 2023-06-15 — End: ?
  Filled 2023-07-18: qty 630, 90d supply, fill #0

## 2023-05-30 MED ORDER — LISDEXAMFETAMINE DIMESYLATE 60 MG PO CAPS
60.0000 mg | ORAL_CAPSULE | Freq: Every morning | ORAL | 0 refills | Status: AC
  Filled 2023-05-30: qty 90, 90d supply, fill #0

## 2023-05-31 ENCOUNTER — Other Ambulatory Visit (HOSPITAL_COMMUNITY): Payer: Self-pay

## 2023-05-31 ENCOUNTER — Other Ambulatory Visit: Payer: Self-pay

## 2023-06-09 ENCOUNTER — Other Ambulatory Visit (HOSPITAL_COMMUNITY): Payer: Self-pay

## 2023-06-10 ENCOUNTER — Other Ambulatory Visit: Payer: Self-pay

## 2023-06-10 ENCOUNTER — Other Ambulatory Visit (HOSPITAL_COMMUNITY): Payer: Self-pay

## 2023-06-18 ENCOUNTER — Ambulatory Visit: Payer: Commercial Managed Care - PPO | Admitting: Podiatry

## 2023-06-18 ENCOUNTER — Encounter: Payer: Self-pay | Admitting: Podiatry

## 2023-06-18 DIAGNOSIS — Z1329 Encounter for screening for other suspected endocrine disorder: Secondary | ICD-10-CM | POA: Diagnosis not present

## 2023-06-18 DIAGNOSIS — L603 Nail dystrophy: Secondary | ICD-10-CM | POA: Diagnosis not present

## 2023-06-18 DIAGNOSIS — Z130 Encounter for screening for diseases of the blood and blood-forming organs and certain disorders involving the immune mechanism: Secondary | ICD-10-CM | POA: Diagnosis not present

## 2023-06-18 DIAGNOSIS — Z79899 Other long term (current) drug therapy: Secondary | ICD-10-CM | POA: Diagnosis not present

## 2023-06-18 NOTE — Progress Notes (Signed)
  Subjective:  Patient ID: Kimberly Haynes, female    DOB: [birth year],   MRN: [id number]  Chief Complaint  Patient presents with   Nail Problem    62 y.o. female presents for concern of  right great toenail. Relates the nail was stomped on by her grandchild and turned blue and had a blood blister. Then about a week ago the nail just fell off. Here today to make sure she has no infeciton and no concerns . Denies any other pedal complaints. Denies n/v/f/c.   Past Medical History:  Diagnosis Date   Bipolar disorder ([facility])    Depression    Hearing impairment    Sleep apnea    uses dental appliance    Objective:  Physical Exam: Vascular: DP/PT pulses 2/4 bilateral. CFT <3 seconds. Normal hair growth on digits. No edema.  Skin. No lacerations or abrasions bilateral feet. Right hallux nail avulsed with underlying nail bed well healed and no signs of infection.  Musculoskeletal: MMT 5/5 bilateral lower extremities in DF, PF, Inversion and Eversion. Deceased ROM in DF of ankle joint.  Neurological: Sensation intact to light touch.   Assessment:   1. Onychodystrophy      Plan:  Patient was evaluated and treated and all questions answered. Toe was evaluated and appears to be healing well.  Discussed potential for stunted nail growth vs dystrophic nail and discussed prevention methods including urea nail gel.  Patient to follow-up as needed.    Louann Sjogren, DPM

## 2023-06-29 DIAGNOSIS — L821 Other seborrheic keratosis: Secondary | ICD-10-CM | POA: Diagnosis not present

## 2023-07-18 ENCOUNTER — Other Ambulatory Visit: Payer: Self-pay

## 2023-07-18 ENCOUNTER — Other Ambulatory Visit (HOSPITAL_COMMUNITY): Payer: Self-pay

## 2023-07-18 MED ORDER — OXCARBAZEPINE 300 MG PO TABS
ORAL_TABLET | ORAL | 3 refills | Status: AC
  Filled 2023-07-18 – 2023-09-26 (×2): qty 210, 30d supply, fill #0
  Filled 2024-01-17: qty 210, 30d supply, fill #1
  Filled 2024-03-25: qty 210, 30d supply, fill #2
  Filled 2024-04-23: qty 210, 30d supply, fill #3

## 2023-07-27 ENCOUNTER — Other Ambulatory Visit (HOSPITAL_COMMUNITY): Payer: Self-pay

## 2023-08-02 ENCOUNTER — Other Ambulatory Visit (HOSPITAL_COMMUNITY): Payer: Self-pay

## 2023-08-03 ENCOUNTER — Other Ambulatory Visit (HOSPITAL_COMMUNITY): Payer: Self-pay

## 2023-08-03 ENCOUNTER — Other Ambulatory Visit: Payer: Self-pay

## 2023-08-03 MED ORDER — LITHIUM CARBONATE 150 MG PO CAPS
150.0000 mg | ORAL_CAPSULE | Freq: Every day | ORAL | 0 refills | Status: AC
  Filled 2023-08-03: qty 90, 90d supply, fill #0

## 2023-08-07 ENCOUNTER — Other Ambulatory Visit (HOSPITAL_COMMUNITY): Payer: Self-pay

## 2023-08-08 ENCOUNTER — Other Ambulatory Visit: Payer: Self-pay

## 2023-08-08 ENCOUNTER — Other Ambulatory Visit (HOSPITAL_COMMUNITY): Payer: Self-pay

## 2023-08-08 MED ORDER — LAMOTRIGINE 100 MG PO TABS
100.0000 mg | ORAL_TABLET | Freq: Every day | ORAL | 0 refills | Status: DC
  Filled 2023-08-08: qty 90, 90d supply, fill #0

## 2023-08-19 ENCOUNTER — Other Ambulatory Visit (HOSPITAL_COMMUNITY): Payer: Self-pay

## 2023-08-20 ENCOUNTER — Other Ambulatory Visit: Payer: Self-pay

## 2023-08-20 ENCOUNTER — Other Ambulatory Visit (HOSPITAL_COMMUNITY): Payer: Self-pay

## 2023-08-20 MED ORDER — ROSUVASTATIN CALCIUM 5 MG PO TABS
5.0000 mg | ORAL_TABLET | Freq: Every day | ORAL | 0 refills | Status: DC
  Filled 2023-08-20: qty 90, 90d supply, fill #0

## 2023-08-20 MED ORDER — BUPROPION HCL ER (XL) 300 MG PO TB24
300.0000 mg | ORAL_TABLET | Freq: Every morning | ORAL | 1 refills | Status: DC
  Filled 2023-08-20: qty 90, 90d supply, fill #0
  Filled 2023-11-20: qty 90, 90d supply, fill #1

## 2023-08-21 ENCOUNTER — Other Ambulatory Visit (HOSPITAL_COMMUNITY): Payer: Self-pay

## 2023-09-03 ENCOUNTER — Other Ambulatory Visit (HOSPITAL_COMMUNITY): Payer: Self-pay

## 2023-09-03 ENCOUNTER — Other Ambulatory Visit: Payer: Self-pay

## 2023-09-03 DIAGNOSIS — F3181 Bipolar II disorder: Secondary | ICD-10-CM | POA: Diagnosis not present

## 2023-09-03 DIAGNOSIS — Z0000 Encounter for general adult medical examination without abnormal findings: Secondary | ICD-10-CM | POA: Diagnosis not present

## 2023-09-03 DIAGNOSIS — Z1211 Encounter for screening for malignant neoplasm of colon: Secondary | ICD-10-CM | POA: Diagnosis not present

## 2023-09-03 DIAGNOSIS — E7800 Pure hypercholesterolemia, unspecified: Secondary | ICD-10-CM | POA: Diagnosis not present

## 2023-09-03 DIAGNOSIS — G4700 Insomnia, unspecified: Secondary | ICD-10-CM | POA: Diagnosis not present

## 2023-09-03 DIAGNOSIS — Z9621 Cochlear implant status: Secondary | ICD-10-CM | POA: Diagnosis not present

## 2023-09-03 DIAGNOSIS — Z23 Encounter for immunization: Secondary | ICD-10-CM | POA: Diagnosis not present

## 2023-09-03 DIAGNOSIS — F900 Attention-deficit hyperactivity disorder, predominantly inattentive type: Secondary | ICD-10-CM | POA: Diagnosis not present

## 2023-09-03 DIAGNOSIS — I10 Essential (primary) hypertension: Secondary | ICD-10-CM | POA: Diagnosis not present

## 2023-09-03 MED ORDER — AMLODIPINE BESYLATE 5 MG PO TABS
5.0000 mg | ORAL_TABLET | Freq: Every morning | ORAL | 1 refills | Status: DC
  Filled 2023-09-03: qty 90, 90d supply, fill #0
  Filled 2023-12-04: qty 90, 90d supply, fill #1

## 2023-09-03 MED ORDER — ROSUVASTATIN CALCIUM 5 MG PO TABS
5.0000 mg | ORAL_TABLET | Freq: Every day | ORAL | 1 refills | Status: DC
  Filled 2023-09-03 – 2023-11-20 (×2): qty 90, 90d supply, fill #0
  Filled 2024-02-19: qty 90, 90d supply, fill #1

## 2023-09-03 MED ORDER — METOPROLOL SUCCINATE ER 100 MG PO TB24
100.0000 mg | ORAL_TABLET | Freq: Every morning | ORAL | 1 refills | Status: DC
  Filled 2023-09-03: qty 90, 90d supply, fill #0
  Filled 2024-01-02: qty 90, 90d supply, fill #1

## 2023-09-25 ENCOUNTER — Other Ambulatory Visit (HOSPITAL_COMMUNITY): Payer: Self-pay

## 2023-09-25 ENCOUNTER — Other Ambulatory Visit: Payer: Self-pay

## 2023-09-25 MED ORDER — LISDEXAMFETAMINE DIMESYLATE 60 MG PO CAPS
60.0000 mg | ORAL_CAPSULE | Freq: Every morning | ORAL | 0 refills | Status: DC
  Filled 2023-09-25: qty 90, 90d supply, fill #0

## 2023-09-28 ENCOUNTER — Other Ambulatory Visit (HOSPITAL_COMMUNITY): Payer: Self-pay

## 2023-09-28 ENCOUNTER — Other Ambulatory Visit: Payer: Self-pay

## 2023-10-01 ENCOUNTER — Other Ambulatory Visit (HOSPITAL_COMMUNITY): Payer: Self-pay

## 2023-10-01 ENCOUNTER — Encounter (HOSPITAL_COMMUNITY): Payer: Self-pay

## 2023-10-02 ENCOUNTER — Other Ambulatory Visit (HOSPITAL_COMMUNITY): Payer: Self-pay

## 2023-10-03 ENCOUNTER — Other Ambulatory Visit: Payer: Self-pay

## 2023-11-05 DIAGNOSIS — F5101 Primary insomnia: Secondary | ICD-10-CM | POA: Diagnosis not present

## 2023-11-05 DIAGNOSIS — F3174 Bipolar disorder, in full remission, most recent episode manic: Secondary | ICD-10-CM | POA: Diagnosis not present

## 2023-11-05 DIAGNOSIS — F900 Attention-deficit hyperactivity disorder, predominantly inattentive type: Secondary | ICD-10-CM | POA: Diagnosis not present

## 2023-11-05 DIAGNOSIS — Z130 Encounter for screening for diseases of the blood and blood-forming organs and certain disorders involving the immune mechanism: Secondary | ICD-10-CM | POA: Diagnosis not present

## 2023-11-05 DIAGNOSIS — Z1329 Encounter for screening for other suspected endocrine disorder: Secondary | ICD-10-CM | POA: Diagnosis not present

## 2023-11-05 DIAGNOSIS — Z79899 Other long term (current) drug therapy: Secondary | ICD-10-CM | POA: Diagnosis not present

## 2023-11-05 DIAGNOSIS — F3176 Bipolar disorder, in full remission, most recent episode depressed: Secondary | ICD-10-CM | POA: Diagnosis not present

## 2023-11-06 ENCOUNTER — Other Ambulatory Visit: Payer: Self-pay

## 2023-11-06 ENCOUNTER — Other Ambulatory Visit (HOSPITAL_COMMUNITY): Payer: Self-pay

## 2023-11-06 MED ORDER — LITHIUM CARBONATE 150 MG PO CAPS
150.0000 mg | ORAL_CAPSULE | Freq: Every day | ORAL | 1 refills | Status: DC
  Filled 2023-11-06 – 2023-11-07 (×2): qty 90, 90d supply, fill #0
  Filled 2024-01-31: qty 90, 90d supply, fill #1

## 2023-11-06 MED ORDER — LAMOTRIGINE 100 MG PO TABS
100.0000 mg | ORAL_TABLET | Freq: Every day | ORAL | 3 refills | Status: DC
  Filled 2023-11-06: qty 90, 90d supply, fill #0
  Filled 2024-01-31: qty 90, 90d supply, fill #1
  Filled 2024-05-02 – 2024-05-05 (×2): qty 90, 90d supply, fill #2
  Filled 2024-07-29: qty 90, 90d supply, fill #3

## 2023-11-07 ENCOUNTER — Other Ambulatory Visit: Payer: Self-pay

## 2023-11-07 ENCOUNTER — Other Ambulatory Visit (HOSPITAL_COMMUNITY): Payer: Self-pay

## 2023-11-08 ENCOUNTER — Other Ambulatory Visit (HOSPITAL_COMMUNITY): Payer: Self-pay

## 2023-11-09 ENCOUNTER — Other Ambulatory Visit (HOSPITAL_COMMUNITY): Payer: Self-pay

## 2023-11-20 ENCOUNTER — Other Ambulatory Visit (HOSPITAL_COMMUNITY): Payer: Self-pay

## 2023-11-21 ENCOUNTER — Other Ambulatory Visit: Payer: Self-pay

## 2023-11-29 ENCOUNTER — Other Ambulatory Visit (HOSPITAL_COMMUNITY): Payer: Self-pay

## 2023-11-29 DIAGNOSIS — I10 Essential (primary) hypertension: Secondary | ICD-10-CM | POA: Diagnosis not present

## 2023-11-29 MED ORDER — WEGOVY 2.4 MG/0.75ML ~~LOC~~ SOAJ
2.4000 mg | SUBCUTANEOUS | 11 refills | Status: AC
  Filled 2023-11-29: qty 3, 28d supply, fill #0
  Filled 2024-01-13: qty 3, 28d supply, fill #1
  Filled 2024-03-04 (×3): qty 3, 28d supply, fill #2

## 2023-12-05 ENCOUNTER — Other Ambulatory Visit (HOSPITAL_COMMUNITY): Payer: Self-pay

## 2024-01-03 ENCOUNTER — Other Ambulatory Visit: Payer: Self-pay

## 2024-01-03 ENCOUNTER — Other Ambulatory Visit (HOSPITAL_COMMUNITY): Payer: Self-pay

## 2024-01-08 ENCOUNTER — Other Ambulatory Visit (HOSPITAL_COMMUNITY): Payer: Self-pay

## 2024-01-13 ENCOUNTER — Other Ambulatory Visit (HOSPITAL_COMMUNITY): Payer: Self-pay

## 2024-01-14 ENCOUNTER — Other Ambulatory Visit (HOSPITAL_COMMUNITY): Payer: Self-pay

## 2024-01-14 ENCOUNTER — Other Ambulatory Visit: Payer: Self-pay

## 2024-01-14 MED ORDER — LISDEXAMFETAMINE DIMESYLATE 60 MG PO CAPS
60.0000 mg | ORAL_CAPSULE | Freq: Every morning | ORAL | 0 refills | Status: DC
  Filled 2024-01-14: qty 90, 90d supply, fill #0

## 2024-01-16 ENCOUNTER — Other Ambulatory Visit (HOSPITAL_COMMUNITY): Payer: Self-pay

## 2024-01-17 ENCOUNTER — Other Ambulatory Visit: Payer: Self-pay

## 2024-01-17 ENCOUNTER — Other Ambulatory Visit (HOSPITAL_COMMUNITY): Payer: Self-pay

## 2024-01-17 MED ORDER — ZALEPLON 10 MG PO CAPS
20.0000 mg | ORAL_CAPSULE | Freq: Every day | ORAL | 0 refills | Status: DC
Start: 2024-01-17 — End: 2024-04-24
  Filled 2024-01-17: qty 180, 90d supply, fill #0

## 2024-01-18 ENCOUNTER — Other Ambulatory Visit: Payer: Self-pay

## 2024-01-18 ENCOUNTER — Other Ambulatory Visit (HOSPITAL_COMMUNITY): Payer: Self-pay

## 2024-01-21 ENCOUNTER — Other Ambulatory Visit (HOSPITAL_COMMUNITY): Payer: Self-pay

## 2024-01-22 ENCOUNTER — Other Ambulatory Visit: Payer: Self-pay

## 2024-01-23 ENCOUNTER — Other Ambulatory Visit (HOSPITAL_COMMUNITY): Payer: Self-pay

## 2024-02-01 ENCOUNTER — Other Ambulatory Visit (HOSPITAL_COMMUNITY): Payer: Self-pay

## 2024-02-01 ENCOUNTER — Other Ambulatory Visit: Payer: Self-pay

## 2024-02-19 ENCOUNTER — Other Ambulatory Visit (HOSPITAL_COMMUNITY): Payer: Self-pay

## 2024-02-20 ENCOUNTER — Other Ambulatory Visit: Payer: Self-pay

## 2024-02-20 ENCOUNTER — Other Ambulatory Visit (HOSPITAL_COMMUNITY): Payer: Self-pay

## 2024-02-20 MED ORDER — BUPROPION HCL ER (XL) 300 MG PO TB24
300.0000 mg | ORAL_TABLET | Freq: Every morning | ORAL | 0 refills | Status: DC
  Filled 2024-02-20: qty 90, 90d supply, fill #0

## 2024-03-03 ENCOUNTER — Other Ambulatory Visit (HOSPITAL_BASED_OUTPATIENT_CLINIC_OR_DEPARTMENT_OTHER): Payer: Self-pay

## 2024-03-03 ENCOUNTER — Other Ambulatory Visit (HOSPITAL_COMMUNITY): Payer: Self-pay

## 2024-03-03 ENCOUNTER — Other Ambulatory Visit: Payer: Self-pay

## 2024-03-03 DIAGNOSIS — F900 Attention-deficit hyperactivity disorder, predominantly inattentive type: Secondary | ICD-10-CM | POA: Diagnosis not present

## 2024-03-03 DIAGNOSIS — E7800 Pure hypercholesterolemia, unspecified: Secondary | ICD-10-CM | POA: Diagnosis not present

## 2024-03-03 DIAGNOSIS — Z23 Encounter for immunization: Secondary | ICD-10-CM | POA: Diagnosis not present

## 2024-03-03 DIAGNOSIS — I10 Essential (primary) hypertension: Secondary | ICD-10-CM | POA: Diagnosis not present

## 2024-03-03 DIAGNOSIS — G4700 Insomnia, unspecified: Secondary | ICD-10-CM | POA: Diagnosis not present

## 2024-03-03 DIAGNOSIS — F3181 Bipolar II disorder: Secondary | ICD-10-CM | POA: Diagnosis not present

## 2024-03-03 MED ORDER — AMLODIPINE BESYLATE 5 MG PO TABS
5.0000 mg | ORAL_TABLET | Freq: Every morning | ORAL | 1 refills | Status: DC
  Filled 2024-03-03: qty 90, 90d supply, fill #0
  Filled 2024-05-28: qty 90, 90d supply, fill #1

## 2024-03-03 MED ORDER — ROSUVASTATIN CALCIUM 5 MG PO TABS
5.0000 mg | ORAL_TABLET | Freq: Every day | ORAL | 1 refills | Status: DC
  Filled 2024-05-28: qty 90, 90d supply, fill #0
  Filled 2024-08-22: qty 90, 90d supply, fill #1

## 2024-03-03 MED ORDER — METOPROLOL SUCCINATE ER 100 MG PO TB24
100.0000 mg | ORAL_TABLET | Freq: Every morning | ORAL | 1 refills | Status: DC
  Filled 2024-03-03 – 2024-03-25 (×2): qty 90, 90d supply, fill #0
  Filled 2024-06-18: qty 90, 90d supply, fill #1

## 2024-03-04 ENCOUNTER — Other Ambulatory Visit (HOSPITAL_COMMUNITY): Payer: Self-pay

## 2024-03-10 DIAGNOSIS — H9193 Unspecified hearing loss, bilateral: Secondary | ICD-10-CM | POA: Diagnosis not present

## 2024-03-19 ENCOUNTER — Other Ambulatory Visit (HOSPITAL_COMMUNITY): Payer: Self-pay

## 2024-03-25 ENCOUNTER — Other Ambulatory Visit (HOSPITAL_COMMUNITY): Payer: Self-pay

## 2024-04-08 ENCOUNTER — Other Ambulatory Visit (HOSPITAL_COMMUNITY): Payer: Self-pay

## 2024-04-14 ENCOUNTER — Other Ambulatory Visit (HOSPITAL_COMMUNITY): Payer: Self-pay

## 2024-04-17 ENCOUNTER — Other Ambulatory Visit (HOSPITAL_COMMUNITY): Payer: Self-pay

## 2024-04-23 ENCOUNTER — Other Ambulatory Visit (HOSPITAL_COMMUNITY): Payer: Self-pay

## 2024-04-24 ENCOUNTER — Other Ambulatory Visit: Payer: Self-pay

## 2024-04-24 ENCOUNTER — Other Ambulatory Visit (HOSPITAL_COMMUNITY): Payer: Self-pay

## 2024-04-24 MED ORDER — LISDEXAMFETAMINE DIMESYLATE 60 MG PO CAPS
60.0000 mg | ORAL_CAPSULE | Freq: Every morning | ORAL | 0 refills | Status: DC
  Filled 2024-04-24: qty 30, 30d supply, fill #0

## 2024-04-24 MED ORDER — ZALEPLON 10 MG PO CAPS
20.0000 mg | ORAL_CAPSULE | Freq: Every day | ORAL | 0 refills | Status: AC
  Filled 2024-04-24: qty 60, 30d supply, fill #0

## 2024-04-29 DIAGNOSIS — F900 Attention-deficit hyperactivity disorder, predominantly inattentive type: Secondary | ICD-10-CM | POA: Diagnosis not present

## 2024-04-29 DIAGNOSIS — F3174 Bipolar disorder, in full remission, most recent episode manic: Secondary | ICD-10-CM | POA: Diagnosis not present

## 2024-04-29 DIAGNOSIS — F5101 Primary insomnia: Secondary | ICD-10-CM | POA: Diagnosis not present

## 2024-04-29 DIAGNOSIS — F3176 Bipolar disorder, in full remission, most recent episode depressed: Secondary | ICD-10-CM | POA: Diagnosis not present

## 2024-04-30 ENCOUNTER — Other Ambulatory Visit (HOSPITAL_COMMUNITY): Payer: Self-pay

## 2024-04-30 MED ORDER — ZALEPLON 10 MG PO CAPS
20.0000 mg | ORAL_CAPSULE | Freq: Every day | ORAL | 0 refills | Status: DC
  Filled 2024-05-26: qty 180, 90d supply, fill #0

## 2024-04-30 MED ORDER — OXCARBAZEPINE 300 MG PO TABS
ORAL_TABLET | ORAL | 0 refills | Status: DC
  Filled 2024-06-28: qty 210, 30d supply, fill #0

## 2024-04-30 MED ORDER — LISDEXAMFETAMINE DIMESYLATE 60 MG PO CAPS
60.0000 mg | ORAL_CAPSULE | Freq: Every morning | ORAL | 0 refills | Status: AC
  Filled 2024-05-26: qty 90, 90d supply, fill #0

## 2024-05-02 ENCOUNTER — Other Ambulatory Visit (HOSPITAL_COMMUNITY): Payer: Self-pay

## 2024-05-03 ENCOUNTER — Other Ambulatory Visit (HOSPITAL_COMMUNITY): Payer: Self-pay

## 2024-05-04 ENCOUNTER — Encounter (HOSPITAL_COMMUNITY): Payer: Self-pay

## 2024-05-05 ENCOUNTER — Other Ambulatory Visit (HOSPITAL_COMMUNITY): Payer: Self-pay

## 2024-05-05 MED ORDER — LITHIUM CARBONATE 150 MG PO CAPS
ORAL_CAPSULE | ORAL | 0 refills | Status: DC
  Filled 2024-05-05: qty 30, 30d supply, fill #0

## 2024-05-21 DIAGNOSIS — Z79899 Other long term (current) drug therapy: Secondary | ICD-10-CM | POA: Diagnosis not present

## 2024-05-21 DIAGNOSIS — E785 Hyperlipidemia, unspecified: Secondary | ICD-10-CM | POA: Diagnosis not present

## 2024-05-21 DIAGNOSIS — Z130 Encounter for screening for diseases of the blood and blood-forming organs and certain disorders involving the immune mechanism: Secondary | ICD-10-CM | POA: Diagnosis not present

## 2024-05-21 DIAGNOSIS — Z1329 Encounter for screening for other suspected endocrine disorder: Secondary | ICD-10-CM | POA: Diagnosis not present

## 2024-05-23 ENCOUNTER — Other Ambulatory Visit (HOSPITAL_COMMUNITY): Payer: Self-pay

## 2024-05-23 ENCOUNTER — Other Ambulatory Visit (HOSPITAL_BASED_OUTPATIENT_CLINIC_OR_DEPARTMENT_OTHER): Payer: Self-pay

## 2024-05-23 MED ORDER — LITHIUM CARBONATE 150 MG PO CAPS
150.0000 mg | ORAL_CAPSULE | Freq: Every day | ORAL | 1 refills | Status: AC
  Filled 2024-05-23 – 2024-05-29 (×2): qty 90, 90d supply, fill #0
  Filled 2024-08-31: qty 90, 90d supply, fill #1

## 2024-05-25 ENCOUNTER — Other Ambulatory Visit (HOSPITAL_COMMUNITY): Payer: Self-pay

## 2024-05-26 ENCOUNTER — Other Ambulatory Visit (HOSPITAL_COMMUNITY): Payer: Self-pay

## 2024-05-26 ENCOUNTER — Other Ambulatory Visit: Payer: Self-pay

## 2024-05-26 MED ORDER — BUPROPION HCL ER (XL) 300 MG PO TB24
300.0000 mg | ORAL_TABLET | Freq: Every morning | ORAL | 1 refills | Status: DC
  Filled 2024-05-26: qty 90, 90d supply, fill #0
  Filled 2024-08-21: qty 90, 90d supply, fill #1

## 2024-05-28 ENCOUNTER — Other Ambulatory Visit (HOSPITAL_COMMUNITY): Payer: Self-pay

## 2024-05-28 ENCOUNTER — Other Ambulatory Visit: Payer: Self-pay

## 2024-05-29 ENCOUNTER — Other Ambulatory Visit (HOSPITAL_COMMUNITY): Payer: Self-pay

## 2024-05-29 ENCOUNTER — Other Ambulatory Visit: Payer: Self-pay

## 2024-06-06 ENCOUNTER — Other Ambulatory Visit: Payer: Self-pay | Admitting: Family Medicine

## 2024-06-06 DIAGNOSIS — N6310 Unspecified lump in the right breast, unspecified quadrant: Secondary | ICD-10-CM

## 2024-06-09 ENCOUNTER — Other Ambulatory Visit (HOSPITAL_COMMUNITY): Payer: Self-pay

## 2024-06-09 DIAGNOSIS — M25511 Pain in right shoulder: Secondary | ICD-10-CM | POA: Diagnosis not present

## 2024-06-09 MED ORDER — HYDROCODONE-ACETAMINOPHEN 5-325 MG PO TABS
1.0000 | ORAL_TABLET | Freq: Four times a day (QID) | ORAL | 0 refills | Status: AC | PRN
  Filled 2024-06-09 (×3): qty 20, 5d supply, fill #0

## 2024-06-10 ENCOUNTER — Other Ambulatory Visit: Payer: Self-pay | Admitting: Physician Assistant

## 2024-06-10 ENCOUNTER — Encounter: Payer: Self-pay | Admitting: Physician Assistant

## 2024-06-10 DIAGNOSIS — G8929 Other chronic pain: Secondary | ICD-10-CM

## 2024-06-13 ENCOUNTER — Ambulatory Visit
Admission: RE | Admit: 2024-06-13 | Discharge: 2024-06-13 | Disposition: A | Discharge status: Home / Self Care | Source: Ambulatory Visit | Attending: Physician Assistant | Admitting: Physician Assistant

## 2024-06-13 DIAGNOSIS — G8929 Other chronic pain: Secondary | ICD-10-CM

## 2024-06-13 DIAGNOSIS — S42141A Displaced fracture of glenoid cavity of scapula, right shoulder, initial encounter for closed fracture: Secondary | ICD-10-CM | POA: Diagnosis not present

## 2024-06-18 ENCOUNTER — Other Ambulatory Visit (HOSPITAL_COMMUNITY): Payer: Self-pay

## 2024-06-19 ENCOUNTER — Other Ambulatory Visit (HOSPITAL_COMMUNITY): Payer: Self-pay

## 2024-06-24 DIAGNOSIS — M25511 Pain in right shoulder: Secondary | ICD-10-CM | POA: Diagnosis not present

## 2024-06-29 ENCOUNTER — Other Ambulatory Visit: Payer: Self-pay

## 2024-07-15 ENCOUNTER — Other Ambulatory Visit (HOSPITAL_COMMUNITY): Payer: Self-pay

## 2024-07-15 DIAGNOSIS — I10 Essential (primary) hypertension: Secondary | ICD-10-CM | POA: Diagnosis not present

## 2024-07-15 MED ORDER — WEGOVY 1.7 MG/0.75ML ~~LOC~~ SOAJ
1.7000 mg | SUBCUTANEOUS | 0 refills | Status: AC
  Filled 2024-07-15 (×2): qty 3, 28d supply, fill #0

## 2024-07-15 MED ORDER — WEGOVY 2.4 MG/0.75ML ~~LOC~~ SOAJ
2.4000 mg | SUBCUTANEOUS | 3 refills | Status: AC
  Filled 2024-07-15: qty 3, 28d supply, fill #0

## 2024-07-16 ENCOUNTER — Other Ambulatory Visit (HOSPITAL_COMMUNITY): Payer: Self-pay

## 2024-07-26 ENCOUNTER — Other Ambulatory Visit (HOSPITAL_COMMUNITY): Payer: Self-pay

## 2024-07-28 ENCOUNTER — Other Ambulatory Visit (HOSPITAL_COMMUNITY): Payer: Self-pay

## 2024-07-29 ENCOUNTER — Other Ambulatory Visit (HOSPITAL_COMMUNITY): Payer: Self-pay

## 2024-08-01 ENCOUNTER — Other Ambulatory Visit (HOSPITAL_COMMUNITY): Payer: Self-pay

## 2024-08-05 ENCOUNTER — Other Ambulatory Visit (HOSPITAL_COMMUNITY): Payer: Self-pay

## 2024-08-05 MED ORDER — OXCARBAZEPINE 300 MG PO TABS
ORAL_TABLET | ORAL | 10 refills | Status: AC
  Filled 2024-08-05: qty 210, 30d supply, fill #0
  Filled 2024-08-31: qty 210, 30d supply, fill #1
  Filled 2024-11-01: qty 210, 30d supply, fill #2

## 2024-08-13 ENCOUNTER — Other Ambulatory Visit (HOSPITAL_COMMUNITY): Payer: Self-pay

## 2024-08-14 ENCOUNTER — Other Ambulatory Visit (HOSPITAL_COMMUNITY): Payer: Self-pay

## 2024-08-21 ENCOUNTER — Other Ambulatory Visit (HOSPITAL_COMMUNITY): Payer: Self-pay

## 2024-08-22 ENCOUNTER — Other Ambulatory Visit (HOSPITAL_COMMUNITY): Payer: Self-pay

## 2024-08-24 ENCOUNTER — Other Ambulatory Visit (HOSPITAL_COMMUNITY): Payer: Self-pay

## 2024-08-27 ENCOUNTER — Other Ambulatory Visit: Payer: Self-pay

## 2024-08-27 ENCOUNTER — Other Ambulatory Visit (HOSPITAL_COMMUNITY): Payer: Self-pay

## 2024-08-27 MED ORDER — AMLODIPINE BESYLATE 5 MG PO TABS
5.0000 mg | ORAL_TABLET | Freq: Every morning | ORAL | 0 refills | Status: DC
  Filled 2024-08-27: qty 90, 90d supply, fill #0

## 2024-08-28 ENCOUNTER — Inpatient Hospital Stay: Admission: RE | Admit: 2024-08-28 | Source: Ambulatory Visit

## 2024-08-28 ENCOUNTER — Encounter

## 2024-09-01 ENCOUNTER — Other Ambulatory Visit (HOSPITAL_COMMUNITY): Payer: Self-pay

## 2024-09-02 DIAGNOSIS — S42144D Nondisplaced fracture of glenoid cavity of scapula, right shoulder, subsequent encounter for fracture with routine healing: Secondary | ICD-10-CM | POA: Diagnosis not present

## 2024-09-15 ENCOUNTER — Other Ambulatory Visit (HOSPITAL_COMMUNITY): Payer: Self-pay

## 2024-09-16 ENCOUNTER — Other Ambulatory Visit (HOSPITAL_COMMUNITY): Payer: Self-pay

## 2024-09-16 ENCOUNTER — Other Ambulatory Visit: Payer: Self-pay

## 2024-09-16 MED ORDER — ZALEPLON 10 MG PO CAPS
20.0000 mg | ORAL_CAPSULE | Freq: Every day | ORAL | 0 refills | Status: AC
  Filled 2024-09-16: qty 180, 90d supply, fill #0

## 2024-09-16 MED ORDER — LISDEXAMFETAMINE DIMESYLATE 60 MG PO CAPS
60.0000 mg | ORAL_CAPSULE | Freq: Every morning | ORAL | 0 refills | Status: DC
  Filled 2024-09-16: qty 30, 30d supply, fill #0

## 2024-09-17 ENCOUNTER — Other Ambulatory Visit (HOSPITAL_COMMUNITY): Payer: Self-pay

## 2024-09-17 ENCOUNTER — Other Ambulatory Visit: Payer: Self-pay

## 2024-09-17 MED ORDER — METOPROLOL SUCCINATE ER 100 MG PO TB24
100.0000 mg | ORAL_TABLET | Freq: Every morning | ORAL | 0 refills | Status: DC
  Filled 2024-09-17: qty 90, 90d supply, fill #0

## 2024-09-18 ENCOUNTER — Other Ambulatory Visit (HOSPITAL_COMMUNITY): Payer: Self-pay

## 2024-10-03 ENCOUNTER — Encounter

## 2024-10-03 ENCOUNTER — Other Ambulatory Visit

## 2024-10-29 ENCOUNTER — Other Ambulatory Visit (HOSPITAL_COMMUNITY): Payer: Self-pay

## 2024-10-29 MED ORDER — LISDEXAMFETAMINE DIMESYLATE 60 MG PO CAPS
60.0000 mg | ORAL_CAPSULE | Freq: Every morning | ORAL | 0 refills | Status: AC
  Filled 2024-10-29: qty 90, 90d supply, fill #0

## 2024-11-01 ENCOUNTER — Other Ambulatory Visit (HOSPITAL_COMMUNITY): Payer: Self-pay

## 2024-11-03 ENCOUNTER — Other Ambulatory Visit (HOSPITAL_COMMUNITY): Payer: Self-pay

## 2024-11-03 MED ORDER — METOPROLOL SUCCINATE ER 100 MG PO TB24
100.0000 mg | ORAL_TABLET | Freq: Every morning | ORAL | 0 refills | Status: AC
  Filled 2024-11-03: qty 90, 90d supply, fill #0

## 2024-11-04 ENCOUNTER — Other Ambulatory Visit (HOSPITAL_COMMUNITY): Payer: Self-pay

## 2024-11-04 MED ORDER — LAMOTRIGINE 100 MG PO TABS
100.0000 mg | ORAL_TABLET | Freq: Every day | ORAL | 0 refills | Status: AC
  Filled 2024-11-04: qty 90, 90d supply, fill #0

## 2024-11-07 ENCOUNTER — Inpatient Hospital Stay: Admission: RE | Admit: 2024-11-07 | Source: Ambulatory Visit

## 2024-11-07 ENCOUNTER — Encounter

## 2024-11-23 ENCOUNTER — Other Ambulatory Visit (HOSPITAL_COMMUNITY): Payer: Self-pay

## 2024-11-24 ENCOUNTER — Other Ambulatory Visit (HOSPITAL_BASED_OUTPATIENT_CLINIC_OR_DEPARTMENT_OTHER): Payer: Self-pay

## 2024-11-24 ENCOUNTER — Other Ambulatory Visit (HOSPITAL_COMMUNITY): Payer: Self-pay

## 2024-11-24 ENCOUNTER — Other Ambulatory Visit: Payer: Self-pay

## 2024-11-24 MED ORDER — BUPROPION HCL ER (XL) 300 MG PO TB24
300.0000 mg | ORAL_TABLET | Freq: Every morning | ORAL | 1 refills | Status: AC
  Filled 2024-11-24: qty 90, 90d supply, fill #0

## 2024-11-24 MED ORDER — ROSUVASTATIN CALCIUM 5 MG PO TABS
5.0000 mg | ORAL_TABLET | Freq: Every day | ORAL | 0 refills | Status: AC
  Filled 2024-11-24: qty 90, 90d supply, fill #0

## 2024-11-24 MED ORDER — AMLODIPINE BESYLATE 5 MG PO TABS
5.0000 mg | ORAL_TABLET | Freq: Every morning | ORAL | 0 refills | Status: AC
  Filled 2024-11-24: qty 90, 90d supply, fill #0

## 2024-12-05 ENCOUNTER — Other Ambulatory Visit: Payer: Self-pay

## 2024-12-05 ENCOUNTER — Other Ambulatory Visit (HOSPITAL_COMMUNITY): Payer: Self-pay

## 2024-12-05 MED ORDER — LITHIUM CARBONATE 150 MG PO CAPS
150.0000 mg | ORAL_CAPSULE | Freq: Every evening | ORAL | 1 refills | Status: AC
  Filled 2024-12-05: qty 90, 90d supply, fill #0
